# Patient Record
Sex: Male | Born: 1940 | Race: Black or African American | Marital: Married | Smoking: Former smoker
Health system: Southern US, Community
[De-identification: ages and names within clinical notes are randomized; demographics above are authoritative.]

## PROBLEM LIST (undated history)

## (undated) DIAGNOSIS — Z87438 Personal history of other diseases of male genital organs: Secondary | ICD-10-CM

## (undated) DIAGNOSIS — B559 Leishmaniasis, unspecified: Secondary | ICD-10-CM

## (undated) DIAGNOSIS — H269 Unspecified cataract: Secondary | ICD-10-CM

## (undated) DIAGNOSIS — M7500 Adhesive capsulitis of unspecified shoulder: Secondary | ICD-10-CM

## (undated) DIAGNOSIS — B370 Candidal stomatitis: Secondary | ICD-10-CM

## (undated) DIAGNOSIS — N400 Enlarged prostate without lower urinary tract symptoms: Secondary | ICD-10-CM

## (undated) DIAGNOSIS — R0602 Shortness of breath: Secondary | ICD-10-CM

## (undated) DIAGNOSIS — B54 Unspecified malaria: Secondary | ICD-10-CM

## (undated) DIAGNOSIS — I447 Left bundle-branch block, unspecified: Secondary | ICD-10-CM

## (undated) DIAGNOSIS — I509 Heart failure, unspecified: Secondary | ICD-10-CM

## (undated) DIAGNOSIS — M7711 Lateral epicondylitis, right elbow: Secondary | ICD-10-CM

## (undated) DIAGNOSIS — I2699 Other pulmonary embolism without acute cor pulmonale: Secondary | ICD-10-CM

## (undated) DIAGNOSIS — I10 Essential (primary) hypertension: Secondary | ICD-10-CM

## (undated) HISTORY — DX: Unspecified cataract: H26.9

## (undated) HISTORY — DX: Adhesive capsulitis of unspecified shoulder: M75.00

## (undated) HISTORY — DX: Personal history of other diseases of male genital organs: Z87.438

## (undated) HISTORY — DX: Left bundle-branch block, unspecified: I44.7

## (undated) HISTORY — DX: Shortness of breath: R06.02

## (undated) HISTORY — DX: Unspecified malaria: B54

## (undated) HISTORY — DX: Lateral epicondylitis, right elbow: M77.11

## (undated) HISTORY — DX: Candidal stomatitis: B37.0

## (undated) HISTORY — DX: Other pulmonary embolism without acute cor pulmonale: I26.99

## (undated) HISTORY — DX: Benign prostatic hyperplasia without lower urinary tract symptoms: N40.0

## (undated) HISTORY — DX: Essential (primary) hypertension: I10

## (undated) HISTORY — DX: Leishmaniasis, unspecified: B55.9

---

## 1978-01-03 HISTORY — PX: TREATMENT FISTULA ANAL: SUR1390

## 2012-06-04 HISTORY — PX: TOTAL KNEE ARTHROPLASTY: SHX125

## 2014-03-23 DIAGNOSIS — Z87438 Personal history of other diseases of male genital organs: Secondary | ICD-10-CM

## 2014-03-23 HISTORY — DX: Personal history of other diseases of male genital organs: Z87.438

## 2014-09-11 ENCOUNTER — Ambulatory Visit (HOSPITAL_COMMUNITY)
Admission: RE | Admit: 2014-09-11 | Discharge: 2014-09-11 | Disposition: A | Discharge status: Home / Self Care | Payer: Medicaid Other | Source: Ambulatory Visit | Attending: Student | Admitting: Student

## 2014-09-11 ENCOUNTER — Encounter: Payer: Self-pay | Admitting: Internal Medicine

## 2014-09-11 ENCOUNTER — Ambulatory Visit (INDEPENDENT_AMBULATORY_CARE_PROVIDER_SITE_OTHER): Payer: Medicaid Other | Admitting: Internal Medicine

## 2014-09-11 ENCOUNTER — Ambulatory Visit (HOSPITAL_COMMUNITY): Payer: Medicaid Other

## 2014-09-11 VITALS — BP 154/71 | HR 75 | Temp 98.1°F | Ht 71.0 in | Wt 224.5 lb

## 2014-09-11 DIAGNOSIS — N400 Enlarged prostate without lower urinary tract symptoms: Secondary | ICD-10-CM

## 2014-09-11 DIAGNOSIS — I10 Essential (primary) hypertension: Secondary | ICD-10-CM | POA: Diagnosis not present

## 2014-09-11 DIAGNOSIS — Z8679 Personal history of other diseases of the circulatory system: Secondary | ICD-10-CM

## 2014-09-11 DIAGNOSIS — Z79899 Other long term (current) drug therapy: Secondary | ICD-10-CM | POA: Diagnosis not present

## 2014-09-11 DIAGNOSIS — Z8619 Personal history of other infectious and parasitic diseases: Secondary | ICD-10-CM | POA: Diagnosis not present

## 2014-09-11 DIAGNOSIS — I447 Left bundle-branch block, unspecified: Secondary | ICD-10-CM | POA: Diagnosis not present

## 2014-09-11 DIAGNOSIS — Z87438 Personal history of other diseases of male genital organs: Secondary | ICD-10-CM | POA: Diagnosis not present

## 2014-09-11 DIAGNOSIS — Z0000 Encounter for general adult medical examination without abnormal findings: Secondary | ICD-10-CM

## 2014-09-11 LAB — GLUCOSE, CAPILLARY: GLUCOSE-CAPILLARY: 91 mg/dL (ref 65–99)

## 2014-09-11 LAB — POCT GLYCOSYLATED HEMOGLOBIN (HGB A1C): Hemoglobin A1C: 5.1

## 2014-09-11 MED ORDER — TAMSULOSIN HCL 0.4 MG PO CAPS: 0.8000 mg | ORAL_CAPSULE | Freq: Every day | ORAL | Status: DC

## 2014-09-11 NOTE — Progress Notes (Signed)
74 year old man from [place] brought in by wife and daughter, who has not sought care for a very long time. 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 He has family history of diabetes among other issues and would like to get screened. He denies shortness of breath, orthopnea, or leg swelling.   Refill his current medications for BP - amlodipine 5.   Increase tamsulosin to 0.8 mg daily. Continue Finasteride. Call the patient after 2 weeks for a follow up.  Get a baseline EKG today.   Rest per resident note. Case seen and examined with Dr Allena Katz. Plan discussed with Dr Allena Katz.

## 2014-09-11 NOTE — Assessment & Plan Note (Signed)
Reported history of LBBB per EKG when in [place]. Patient endorses some SOB when climbing a flight of stairs or walking on elevated ground. He does admit that he tries not to walk much as he is trying to save his right knee from overuse as he does not want another knee replacement as he already had for the left knee. Patient denies any chest pain, palpitations, orthopnea, PND, lower extremity edema.  -EKG today shows LBBB, Left axis deviation, sinus rhythm in bigeminy pattern. -Consider Echo in future -Will need to consider lipid panel on follow up and calculate ASCVD risk, patient will likely need to be on aspirin -Follow up in 2 weeks

## 2014-09-11 NOTE — Assessment & Plan Note (Signed)
BP Readings from Last 3 Encounters:  09/11/14 154/71   Reported history of HTN, currently on Amlodipine 5 mg and Candesartan 8 mg. -Continue current medications and follow blood pressures

## 2014-09-11 NOTE — Patient Instructions (Addendum)
It was a pleasure to meet you [name].  I will increase your Tamsulosin from 0.4 mg a day to 0.8 mg a day. You can take two 0.4 capsules with the same meal each day. I have sent a prescription to the [facility] pharmacy on [address].  We will check an EKG today as well as some blood tests called a CBC, CMP, and Hgb A1C.  Please follow up with [place] in 2 weeks.

## 2014-09-11 NOTE — Progress Notes (Signed)
Patient ID: Stephen Hendricks, male   DOB: 03-27-1940, 74 y.o.   MRN: [id number]   Subjective:   Patient ID: Stephen Hendricks male   DOB: February 22, 1940 74 y.o.   MRN: [id number]  HPI: [name] is a 74 y.o. 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 He presents to [place] as a new patient without any records on file for establishment. He is accompanied by wife and daughter who provide additional history. He states that he moved here from [place] about 5 years ago and the only hospital/clinic visit was for a left knee replacement completed at [facility].  Patient reports episode of prostatitis in March 2016 treated with antibiotics. He was started on tamsulosin and finasteride which has improved his symptoms of urgency and dribbling, but continues with frequency and pain initiating. History of prostate disease in father.  During that time of prostatitis he had fever, but noticed recurring fever one month later and again just a few days ago with temp of 100.1. Fever associated with chills that last only 1 day at most, alleviated with tylenol use. 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 Patient does not believe his fevers are malaria as they do not feel close to his previous symptoms. Patient has right eye cataracts which was surgically corrected and left arm mobile nodule he thought was related to onchocerciasis, but workup was negative. Workup for oral candida was also negative, including HIV. He does not have concern about his fever as they are un-frequent, short-living, and easily relieved with tylenol.  Patient reports recent death of his daughter to breast cancer. He was in [place] for funeral ceremonies and had an EKG done at the time which showed LBBB.  Patient has  history of smoking 2 PPD from 1958-1992 (68 pack years). Admits to alcohol use of 3-4 glasses of whiskey a day. Wife also reports beer. He denies illicit drug use.  Family history of diabetes in brother, enlarged heart in mother, other as above.  No past medical history on file. Current Outpatient Prescriptions  Medication Sig Dispense Refill  . amLODipine (NORVASC) 5 MG tablet Take 5 mg by mouth daily.    . candesartan (ATACAND) 8 MG tablet Take 8 mg by mouth daily.    . finasteride (PROSCAR) 5 MG tablet Take 5 mg by mouth daily.    . tamsulosin (FLOMAX) 0.4 MG CAPS capsule Take 2 capsules (0.8 mg total) by mouth daily. 60 capsule 2   No current facility-administered medications for this visit.   No family history on file. Social History   Social History  . Marital Status: Married    Spouse Name: N/A  . Number of Children: N/A  . Years of Education: N/A   Social History Main Topics  . Smoking status: Former Smoker    Start date: 01/04/1956    Quit date: 01/03/1990  . Smokeless tobacco: None  . Alcohol Use: None  . Drug Use: None  . Sexual Activity: Not Asked   Other Topics Concern  . None   Social History Narrative  . None   Review of Systems: Review of Systems  Constitutional: Negative for fever and chills.  HENT: Negative for ear pain and nosebleeds.        Dry nose and mouth.  Eyes:  Negative for pain.  Respiratory: Positive for cough and shortness of breath. Negative for hemoptysis, sputum production and wheezing.   Cardiovascular: Negative for chest pain, palpitations, orthopnea, claudication, leg swelling and PND.  Gastrointestinal: Negative for heartburn, nausea, vomiting, abdominal pain, diarrhea, constipation, blood in stool and melena.  Genitourinary: Positive for frequency. Negative for urgency, hematuria and flank pain.       Tinge of pain when initiating urination.  Musculoskeletal: Positive for joint pain. Negative for myalgias, back pain and falls.    Skin: Negative for rash.  Neurological: Positive for tingling. Negative for dizziness and headaches.       Numbness and tingling in left foot. Decreased sensation in heels of feet.    Objective:  Physical Exam: Filed Vitals:   09/11/14 1339  BP: 154/71  Pulse: 75  Temp: 98.1 F (36.7 C)  TempSrc: Oral  Height: 5\' 11"  (1.803 m)  Weight: 224 lb 8 oz (101.833 kg)  SpO2: 99%   Physical Exam  Constitutional: He is oriented to person, place, and time. He appears well-developed and well-nourished.  HENT:  Head: Normocephalic and atraumatic.  Right Ear: Tympanic membrane, external ear and ear canal normal.  Left Ear: Tympanic membrane, external ear and ear canal normal.  Mouth/Throat: Uvula is midline, oropharynx is clear and moist and mucous membranes are normal. No oral lesions.  Metal crowns on several teeth.  Eyes: EOM are normal.  Neck: Normal range of motion. Neck supple. No tracheal deviation present. No thyromegaly present.  Cardiovascular: Normal rate, regular rhythm and intact distal pulses.  Exam reveals no gallop and no friction rub.   No murmur heard. Pulmonary/Chest: Effort normal and breath sounds normal. No respiratory distress. He has no wheezes. He has no rales. He exhibits no tenderness.  Abdominal: Soft. Bowel sounds are normal. He exhibits no distension. There is no tenderness. There is no guarding.  Musculoskeletal: He exhibits no edema or tenderness.       Right shoulder: He exhibits decreased range of motion.       Left shoulder: He exhibits normal range of motion.  Freely mobile non-tender nodule about 1.5 cm diameter on left forearm.  Lymphadenopathy:    He has no cervical adenopathy.  Neurological: He is alert and oriented to person, place, and time.  Skin: Skin is warm. No rash noted.  Psychiatric: He has a normal mood and affect.    Assessment & Plan:  Please see problem-based charting for A/P.

## 2014-09-11 NOTE — Assessment & Plan Note (Addendum)
Patient with hx prostatitis that grew E. Coli in March 2016 treated w/ antibiotics. Has complaints of "tinge of pain" on initiation of urine that is not present after flow starts. Also endorses frequency, but no urgency, dribbling, incontinence, or hematuria. He is currently on Tamsulosin 0.4 mg daily and Finasteride 5 mg daily. Patient concerned due to hx of prostate disease in father and previous episode of prostatitis.  -Increase Tamsulosin to 0.8 mg daily -Continue Finasteride 5 mg daily -CBC/CMP -Follow up in 2 weeks for symptoms/change

## 2014-09-11 NOTE — Assessment & Plan Note (Addendum)
Patient refused flu shot today. -Hgb A1C today

## 2014-09-12 LAB — CBC
HEMATOCRIT: 45 % (ref 37.5–51.0)
Hemoglobin: 15.1 g/dL (ref 12.6–17.7)
MCH: 30.4 pg (ref 26.6–33.0)
MCHC: 33.6 g/dL (ref 31.5–35.7)
MCV: 91 fL (ref 79–97)
PLATELETS: 140 10*3/uL — AB (ref 150–379)
RBC: 4.97 x10E6/uL (ref 4.14–5.80)
RDW: 14 % (ref 12.3–15.4)
WBC: 3.9 10*3/uL (ref 3.4–10.8)

## 2014-09-12 LAB — CMP14 + ANION GAP
ALBUMIN: 3.9 g/dL (ref 3.5–4.8)
ALK PHOS: 61 IU/L (ref 39–117)
ALT: 27 IU/L (ref 0–44)
AST: 23 IU/L (ref 0–40)
Albumin/Globulin Ratio: 1.3 (ref 1.1–2.5)
Anion Gap: 17 mmol/L (ref 10.0–18.0)
BILIRUBIN TOTAL: 0.5 mg/dL (ref 0.0–1.2)
BUN / CREAT RATIO: 16 (ref 10–22)
BUN: 16 mg/dL (ref 8–27)
CHLORIDE: 98 mmol/L (ref 97–108)
CO2: 25 mmol/L (ref 18–29)
Calcium: 9.4 mg/dL (ref 8.6–10.2)
Creatinine, Ser: 0.97 mg/dL (ref 0.76–1.27)
GFR calc non Af Amer: 78 mL/min/{1.73_m2} (ref >59)
GFR, EST AFRICAN AMERICAN: 90 mL/min/{1.73_m2} (ref >59)
GLOBULIN, TOTAL: 3.1 g/dL (ref 1.5–4.5)
Glucose: 96 mg/dL (ref 65–99)
Potassium: 4.1 mmol/L (ref 3.5–5.2)
SODIUM: 140 mmol/L (ref 134–144)
TOTAL PROTEIN: 7 g/dL (ref 6.0–8.5)

## 2014-09-29 ENCOUNTER — Ambulatory Visit (INDEPENDENT_AMBULATORY_CARE_PROVIDER_SITE_OTHER): Payer: Medicaid Other | Admitting: Internal Medicine

## 2014-09-29 ENCOUNTER — Encounter: Payer: Self-pay | Admitting: Internal Medicine

## 2014-09-29 VITALS — BP 135/67 | HR 86 | Temp 97.0°F | Wt 228.5 lb

## 2014-09-29 DIAGNOSIS — I10 Essential (primary) hypertension: Secondary | ICD-10-CM

## 2014-09-29 DIAGNOSIS — M546 Pain in thoracic spine: Secondary | ICD-10-CM

## 2014-09-29 DIAGNOSIS — G629 Polyneuropathy, unspecified: Secondary | ICD-10-CM | POA: Diagnosis not present

## 2014-09-29 DIAGNOSIS — I447 Left bundle-branch block, unspecified: Secondary | ICD-10-CM | POA: Diagnosis not present

## 2014-09-29 DIAGNOSIS — J3489 Other specified disorders of nose and nasal sinuses: Secondary | ICD-10-CM | POA: Diagnosis not present

## 2014-09-29 DIAGNOSIS — N401 Enlarged prostate with lower urinary tract symptoms: Secondary | ICD-10-CM

## 2014-09-29 DIAGNOSIS — R350 Frequency of micturition: Secondary | ICD-10-CM

## 2014-09-29 DIAGNOSIS — R200 Anesthesia of skin: Secondary | ICD-10-CM | POA: Insufficient documentation

## 2014-09-29 DIAGNOSIS — R208 Other disturbances of skin sensation: Secondary | ICD-10-CM | POA: Insufficient documentation

## 2014-09-29 DIAGNOSIS — N400 Enlarged prostate without lower urinary tract symptoms: Secondary | ICD-10-CM

## 2014-09-29 DIAGNOSIS — Z8679 Personal history of other diseases of the circulatory system: Secondary | ICD-10-CM

## 2014-09-29 MED ORDER — SALINE SPRAY 0.65 % NA SOLN: 1.0000 | NASAL | Status: DC | PRN

## 2014-09-29 NOTE — Assessment & Plan Note (Signed)
Patient reports midline thoracic back pain that is chronic, first noticed after lifting heavy objects quite some time ago. He states that the pain occurs in the morning when he wakes, is about 7/10 in intensity, lasts for 30 minutes, and finds relief with movement and tylenol. He denies any radiation of pain or limiting of his daily activities. There is no tenderness of the spinous processes or paraspinal muscles on exam or decreased ROM of back. His symptoms are likely due to osteoarthritis, controlled well with tylenol. -Will continue with tylenol prn -Do not think imaging is warranted at this time, can consider if symptoms worsen or limit daily activities

## 2014-09-29 NOTE — Assessment & Plan Note (Signed)
BP Readings from Last 3 Encounters:  09/29/14 135/67  09/11/14 154/71   Blood pressure improved today at 135/67. -Continue Amlodipine 5 mg and Candesartan 8 mg daily

## 2014-09-29 NOTE — Assessment & Plan Note (Signed)
Patient reports chronic numbness of left plantar surface that is constant, without pain or tingling, but bothersome. He reports it being worse when wearing shoes compared to feeling better when wearing sandals. He denies any limit in ambulation due to his symptoms. On exam, sensation is intact in bilateral feet, pedal pulses intact, and ROM intact. He is able to bear weight on foot without issue. At this point, I am not sure what the reason for this neuropathy is, but doubt it is due to diabetic neuropathy as his A1C is 5.1, plantar fascitis as I would expect more significant pain, mononeuritis as there is no foot drop, or B12 deficiency with MCV of 91. Will consider other causes for neuropathy such as HIV, syphilis, or inflammatory disorders. -Check ESR today, patient declined HIV and RPR -Referral for nerve conduction study

## 2014-09-29 NOTE — Assessment & Plan Note (Signed)
Patient states his symptoms of urinary frequency is much improved with the increase in Tamsulosin to 0.8 mg daily on last visit. He and his family have continued concerns about prostate disease due to history in father. Discussed with patient and family that medical management for his symptoms are likely to be long-term and that it is encouraging that he is responding to the increased Tamsulosin dose. Also discussed the option for PSA testing and possible need for Urology referral/invasive procedures should result be significantly high versus continuing medical management. They understand and prefer PSA testing. -Check PSA today -Continue Tamsulosin 0.8 mg  -Continue Finasteride 5 mg daily

## 2014-09-29 NOTE — Patient Instructions (Signed)
Please continue your medications as prescribed.  I will send a referral to cardiology for your left bundle branch block and check a lipid profile today.  We will check a PSA today for your prostate.  We will check a blood test called ESR today for your foot numbness and refer you for nerve conduction study.  I have sent a prescription for a nasal spray to your pharmacy for your nasal dryness and bleeding.  Please continue to manage your back pain with tylenol.

## 2014-09-29 NOTE — Progress Notes (Signed)
Patient ID: Stephen Hendricks, male   DOB: [birth year], 74 y.o.   MRN: [id number]   Subjective:   Patient ID: Stephen Hendricks male   DOB: 1940/03/02 74 y.o.   MRN: [id number]  HPI: [name] is a 74 y.o. male with PMH of HTN, BPH, and LBBB who presents for follow up for HTN and BPH management with complaints of back pain, nasal dryness/irritation, and left foot numbness. He is accompanied by his wife and daughter.  Please see problem list for status of chronic medical conditions.   No past medical history on file. Current Outpatient Prescriptions  Medication Sig Dispense Refill  . amLODipine (NORVASC) 5 MG tablet Take 5 mg by mouth daily.    Marland Kitchen aspirin 81 MG tablet Take 81 mg by mouth daily.    . candesartan (ATACAND) 8 MG tablet Take 8 mg by mouth daily.    . finasteride (PROSCAR) 5 MG tablet Take 5 mg by mouth daily.    . tamsulosin (FLOMAX) 0.4 MG CAPS capsule Take 2 capsules (0.8 mg total) by mouth daily. 60 capsule 2  . sodium chloride (OCEAN) 0.65 % SOLN nasal spray Place 1 spray into both nostrils as needed for congestion. 50 mL 0   No current facility-administered medications for this visit.   No family history on file. Social History   Social History  . Marital Status: Married    Spouse Name: N/A  . Number of Children: N/A  . Years of Education: N/A   Social History Main Topics  . Smoking status: Former Smoker    Start date: 01/04/1956    Quit date: 01/03/1990  . Smokeless tobacco: None  . Alcohol Use: None  . Drug Use: None  . Sexual Activity: Not Asked   Other Topics Concern  . None   Social History Narrative   Review of Systems: Review of Systems  Constitutional: Negative for fever and chills.  HENT: Negative for nosebleeds.   Respiratory: Negative for cough, sputum production and wheezing.        SOB when walking up stairs and on inclines.  Cardiovascular: Negative for chest pain, palpitations, orthopnea, claudication, leg swelling and PND.    Gastrointestinal: Negative for heartburn, nausea, vomiting, abdominal pain, diarrhea and constipation.  Genitourinary: Negative for dysuria, urgency and frequency.  Musculoskeletal: Positive for back pain and joint pain. Negative for myalgias, falls and neck pain.  Skin: Negative for rash.  Neurological: Negative for dizziness, tingling, weakness and headaches.       Left foot numbness on plantar surface.    Objective:  Physical Exam: Filed Vitals:   09/29/14 1337  BP: 135/67  Pulse: 86  Temp: 97 F (36.1 C)  TempSrc: Oral  Weight: 228 lb 8 oz (103.647 kg)  SpO2: 97%   Physical Exam  Constitutional: He is oriented to person, place, and time. He appears well-developed and well-nourished. No distress.  HENT:  Head: Normocephalic and atraumatic.  Nose: No mucosal edema. No epistaxis.  Mouth/Throat: Oropharynx is clear and moist.  Dry, slightly erythematous sore seen on left nasal septum.  Cardiovascular: Normal rate, regular rhythm and intact distal pulses.  Exam reveals no friction rub.   No murmur heard. Pulmonary/Chest: Effort normal and breath sounds normal. No respiratory distress. He has no wheezes.  Abdominal: Soft. Bowel sounds are normal. There is no tenderness.  Musculoskeletal:       Cervical back: He exhibits no tenderness and no bony tenderness.       Thoracic back: He exhibits normal  range of motion and no tenderness.       Lumbar back: He exhibits normal range of motion and no tenderness.       Right foot: There is normal range of motion and no tenderness.       Left foot: There is normal range of motion and no tenderness.  Neurological: He is alert and oriented to person, place, and time.  Sensation intact of bilateral feet.  Skin: Skin is warm.  Psychiatric: He has a normal mood and affect.    Assessment & Plan:  Please see problem based charting for assessment and plan.

## 2014-09-29 NOTE — Assessment & Plan Note (Signed)
EKG on last clinic visit shows LBBB, left axis deviation, and sinus rhythm in bigeminy pattern consistent with patient's self-reported history of LBBB. He reports some SOB when walking on elevated ground or up a flight of stairs. He denies any CP, palpitations, orthopnea, PND, cough, or lower extremity edema. His BP is currently controlled on Amlodipine 5 mg and Candesartan 8 mg daily. He reports taking Aspirin 81 mg daily. He and his family express concerns about further evaluation and/or intervention that should be pursued. At this time, I feel that medical management with blood pressure control and aspirin is sufficient so long as his symptoms are stable/do not worsen, but will request cardiology referral for additional rec's.  -Cardiology referral -Continue BP control with Amlodipine 5 mg and Candesartan 8 mg -Continue Aspirin 81 mg -Check Lipid profile today

## 2014-09-29 NOTE — Assessment & Plan Note (Signed)
Patient reports left sided nasal dryness with spotting of blood seen on tissue after blowing nose. He states that this has been going on for many months now and does admit that he sometimes picks at the irritated area. There is a small dry, healing area seen on left side of nasal septum during physical exam. Patient reports trying a topical cream in the past without relief. -Will try nasal spray (OCEAN) for dryness -Patient advised to avoid picking at area

## 2014-09-30 LAB — LIPID PANEL
CHOL/HDL RATIO: 3.3 ratio (ref 0.0–5.0)
Cholesterol, Total: 221 mg/dL — ABNORMAL HIGH (ref 100–199)
HDL: 67 mg/dL (ref >39)
LDL CALC: 124 mg/dL — AB (ref 0–99)
TRIGLYCERIDES: 151 mg/dL — AB (ref 0–149)
VLDL Cholesterol Cal: 30 mg/dL (ref 5–40)

## 2014-09-30 LAB — PSA: Prostate Specific Ag, Serum: 1.5 ng/mL (ref 0.0–4.0)

## 2014-09-30 LAB — SEDIMENTATION RATE: Sed Rate: 3 mm/hr (ref 0–30)

## 2014-09-30 NOTE — Progress Notes (Signed)
Internal Medicine Clinic Attending  I saw and evaluated the patient.  I personally confirmed the key portions of the history and exam documented by Dr. Darreld Mclean and I reviewed pertinent patient test results.  The assessment, diagnosis, and plan were formulated together and I agree with the documentation in the resident's note.  Patient and family are very interested in referrals and advanced imaging.  Have reviewed Dr. Eliane Decree note.  They may benefit from frequent visits.

## 2014-10-02 DIAGNOSIS — M7711 Lateral epicondylitis, right elbow: Secondary | ICD-10-CM | POA: Insufficient documentation

## 2014-10-02 DIAGNOSIS — I447 Left bundle-branch block, unspecified: Secondary | ICD-10-CM | POA: Insufficient documentation

## 2014-10-02 DIAGNOSIS — I10 Essential (primary) hypertension: Secondary | ICD-10-CM | POA: Insufficient documentation

## 2014-10-02 DIAGNOSIS — M7500 Adhesive capsulitis of unspecified shoulder: Secondary | ICD-10-CM | POA: Insufficient documentation

## 2014-10-03 ENCOUNTER — Encounter: Payer: Self-pay | Admitting: Physician Assistant

## 2014-10-03 ENCOUNTER — Ambulatory Visit (INDEPENDENT_AMBULATORY_CARE_PROVIDER_SITE_OTHER): Payer: Medicaid Other | Admitting: Physician Assistant

## 2014-10-03 VITALS — BP 146/78 | HR 87 | Ht 71.0 in | Wt 225.0 lb

## 2014-10-03 DIAGNOSIS — R0600 Dyspnea, unspecified: Secondary | ICD-10-CM

## 2014-10-03 DIAGNOSIS — I10 Essential (primary) hypertension: Secondary | ICD-10-CM

## 2014-10-03 DIAGNOSIS — E785 Hyperlipidemia, unspecified: Secondary | ICD-10-CM

## 2014-10-03 DIAGNOSIS — I447 Left bundle-branch block, unspecified: Secondary | ICD-10-CM

## 2014-10-03 MED ORDER — ATORVASTATIN CALCIUM 40 MG PO TABS: 40.0000 mg | ORAL_TABLET | Freq: Every day | ORAL | Status: DC

## 2014-10-03 NOTE — Progress Notes (Signed)
Patient ID: Stephen Hendricks, male   DOB: 1940/06/19, 74 y.o.   MRN: [id number]    Date:  10/03/2014   ID:  Stephen Hendricks, DOB 24-May-1940, MRN [id number]  PCP:  Darreld Mclean, MD  Primary Cardiologist:  New   Complaint: Left bundle branch block   History of Present Illness: Stephen Hendricks is a 74 y.o. male with a history of essential hypertension, BPH, prostatitis left bundle branch block, malaria, leishmaniasis, frozen shoulder.  The patient is here for evaluation of his left bundle branch block.  Until approximately 2 years ago the patient's wife usually had a hard time keeping up with her husband despite his limp because of his left knee arthritis however, since that time he has been having a hard time keeping up with her because of shortness of breath. This probably gotten worse in the last year. He denies any chest pain or pressure as well as nausea, vomiting, orthopnea, dizziness, PND, cough, congestion, abdominal pain, hematochezia, melena, lower extremity edema, claudication.  His had some periodic fevers or chills related to malaria.  He quit smoking in 1993. He drinks 3-4 drinks per day.   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 When he started having problems with his left knee and stopped all activities for the most part  Wt Readings from Last 3 Encounters:  10/03/14 225 lb (102.059 kg)  09/29/14 228 lb 8 oz (103.647 kg)  09/11/14 224 lb 8 oz (101.833 kg)     Past Medical History  Diagnosis Date  . HTN (hypertension)   . BPH (benign prostatic hyperplasia)   . LBBB (left bundle branch block)   . Frozen shoulder   . Right tennis elbow   . Hx of prostatitis 3.20.16    WITH E.COLI  . Cataract of right eye   . Malaria     WENT TO [place]  . Leishmaniasis     WENT TO [place]  . Oral candidiasis     WENT TO [place]  . SOB (shortness of breath)    Family History  Problem Relation Age of Onset  . Other Father     PROSTATE DISEASE  .  Cancer Daughter     BREAST  . Heart attack Neg Hx   . Hypertension Neg Hx   . Stroke Neg Hx   . Cardiomyopathy Mother      Current Outpatient Prescriptions  Medication Sig Dispense Refill  . amLODipine (NORVASC) 5 MG tablet Take 5 mg by mouth daily.    Marland Kitchen aspirin 81 MG tablet Take 81 mg by mouth daily.    . candesartan (ATACAND) 8 MG tablet Take 8 mg by mouth daily.    . finasteride (PROSCAR) 5 MG tablet Take 5 mg by mouth daily.    . sodium chloride (OCEAN) 0.65 % SOLN nasal spray Place 1 spray into both nostrils as needed for congestion. 50 mL 0  . tamsulosin (FLOMAX) 0.4 MG CAPS capsule Take 2 capsules (0.8 mg total) by mouth daily. 60 capsule 2  . atorvastatin (LIPITOR) 40 MG tablet Take 1 tablet (40 mg total) by mouth daily. 90 tablet 3   No current facility-administered medications for this visit.    Allergies:   No Known Allergies  Social History:  The patient  reports that he quit smoking about 23 years ago. He started smoking about 58 years ago. He does not have any smokeless tobacco history on file. He reports that he drinks about 1.8 -  2.4 oz of alcohol per week.   Family history:   Family History  Problem Relation Age of Onset  . Other Father     PROSTATE DISEASE  . Cancer Daughter     BREAST  . Heart attack Neg Hx   . Hypertension Neg Hx   . Stroke Neg Hx   . Cardiomyopathy Mother     ROS:  Please see the history of present illness.  All other systems reviewed and negative.   PHYSICAL EXAM: VS:  BP 146/78 mmHg  Pulse 87  Ht  (1.803 m)  Wt 225 lb (102.059 kg)  BMI 31.39 kg/m2 Well nourished, well developed, in no acute distress HEENT: Pupils are equal round react to light accommodation extraocular movements are intact. Bilateral corneal arcus.  No carotid bruit Neck: no JVDNo cervical lymphadenopathy. Cardiac: Regular rate and rhythm without murmurs rubs or gallops. Lungs:  clear to auscultation bilaterally, no wheezing, rhonchi or rales Abd: soft,  nontender, positive bowel sounds all quadrants, no hepatosplenomegaly Ext: no lower extremity edema.  2+ radial and dorsalis pedis pulses. Skin: warm and dry Neuro:  Grossly normal  Lipid Panel     Component Value Date/Time   CHOL 221* 09/29/2014 1511   TRIG 151* 09/29/2014 1511   HDL 67 09/29/2014 1511   CHOLHDL 3.3 09/29/2014 1511   LDLCALC 124* 09/29/2014 1511     EKG:   Normal sinus rhythm rate 87 bpm. Mildly prolonged QTC. Possible atrial enlargement.  ASSESSMENT AND PLAN:  Problem List Items Addressed This Visit    LBBB (left bundle branch block)   Relevant Medications   atorvastatin (LIPITOR) 40 MG tablet   Other Relevant Orders   EKG 12-Lead   Myocardial Perfusion Imaging   Echocardiogram   Essential hypertension - Primary   Relevant Medications   atorvastatin (LIPITOR) 40 MG tablet   Other Relevant Orders   EKG 12-Lead   Hepatic function panel   Lipid Profile    Other Visit Diagnoses    Dyspnea        Relevant Orders    Myocardial Perfusion Imaging    Echocardiogram       Left bundle branch block, paroxysmal: Left bundle branch block has resolved on today's EKG.   We'll conduct [employer].  Dyspnea Check 2-D echocardiogram as well as Lexiscan. It's been the last 1-2 years since his dyspnea and exercise tolerance is decreased considerably.  Hyperlipidemia Start Lipitor 40 mg daily. Check LFTs and lipids in 6 weeks. Baseline LFTs normal 09/11/2014  Essential Hypertension No changes to current medication. May need titration of amlodipine or candesartan in the future  Discussed with Dr. Delton See

## 2014-10-03 NOTE — Patient Instructions (Addendum)
Medication Instructions:  Your physician has recommended you make the following change in your medication:  1. START Lipitor 40 mg taking 1 tablet daily    Labwork: 11-14-14: LFT                  LIPID  Testing/Procedures: Your physician has requested that you have a lexiscan myoview. For further information please visit [email or web address] Please follow instruction sheet, as given.  Your physician has requested that you have an echocardiogram. Echocardiography is a painless test that uses sound waves to create images of your heart. It provides your doctor with information about the size and shape of your heart and how well your heart's chambers and valves are working. This procedure takes approximately one hour. There are no restrictions for this procedure.   Follow-Up: Your physician recommends that you schedule a follow-up appointment in: 3 WEEKS WITH BRYAN HAGER, PA-C(FLEX IS FINE PER BRYAN)   Any Other Special Instructions Will Be Listed Below (If Applicable).  Pharmacologic Stress Electrocardiogram A pharmacologic stress electrocardiogram is a heart (cardiac) test that uses nuclear imaging to evaluate the blood supply to your heart. This test may also be called a pharmacologic stress electrocardiography. Pharmacologic means that a medicine is used to increase your heart rate and blood pressure.  This stress test is done to find areas of poor blood flow to the heart by determining the extent of coronary artery disease (CAD). Some people exercise on a treadmill, which naturally increases the blood flow to the heart. For those people unable to exercise on a treadmill, a medicine is used. This medicine stimulates your heart and will cause your heart to beat harder and more quickly, as if you were exercising.  Pharmacologic stress tests can help determine:  The adequacy of blood flow to your heart during increased levels of activity in order to clear you for discharge home.  The extent  of coronary artery blockage caused by CAD.  Your prognosis if you have suffered a heart attack.  The effectiveness of cardiac procedures done, such as an angioplasty, which can increase the circulation in your coronary arteries.  Causes of chest pain or pressure. LET [facility] CARE PROVIDER KNOW ABOUT:  Any allergies you have.  All medicines you are taking, including vitamins, herbs, eye drops, creams, and over-the-counter medicines.  Previous problems you or members of your family have had with the use of anesthetics.  Any blood disorders you have.  Previous surgeries you have had.  Medical conditions you have.  Possibility of pregnancy, if this applies.  If you are currently breastfeeding. RISKS AND COMPLICATIONS Generally, this is a safe procedure. However, as with any procedure, complications can occur. Possible complications include:  You develop pain or pressure in the following areas:  Chest.  Jaw or neck.  Between your shoulder blades.  Radiating down your left arm.  Headache.  Dizziness or light-headedness.  Shortness of breath.  Increased or irregular heartbeat.  Low blood pressure.  Nausea or vomiting.  Flushing.  Redness going up the arm and slight pain during injection of medicine.  Heart attack (rare). BEFORE THE PROCEDURE   Avoid all forms of caffeine for 24 hours before your test or as directed by your health care provider. This includes coffee, tea (even decaffeinated tea), caffeinated sodas, chocolate, cocoa, and certain pain medicines.  Follow your health care provider's instructions regarding eating and drinking before the test.  Take your medicines as directed at regular times with water unless instructed otherwise.  Exceptions may include:  If you have diabetes, ask how you are to take your insulin or pills. It is common to adjust insulin dosing the morning of the test.  If you are taking beta-blocker medicines, it is important to  talk to your health care provider about these medicines well before the date of your test. Taking beta-blocker medicines may interfere with the test. In some cases, these medicines need to be changed or stopped 24 hours or more before the test.  If you wear a nitroglycerin patch, it may need to be removed prior to the test. Ask your health care provider if the patch should be removed before the test.  If you use an inhaler for any breathing condition, bring it with you to the test.  If you are an outpatient, bring a snack so you can eat right after the stress phase of the test.  Do not smoke for 4 hours prior to the test or as directed by your health care provider.  Do not apply lotions, powders, creams, or oils on your chest prior to the test.  Wear comfortable shoes and clothing. Let your health care provider know if you were unable to complete or follow the preparations for your test. PROCEDURE   Multiple patches (electrodes) will be put on your chest. If needed, small areas of your chest may be shaved to get better contact with the electrodes. Once the electrodes are attached to your body, multiple wires will be attached to the electrodes, and your heart rate will be monitored.  An IV access will be started. A nuclear trace (isotope) is given. The isotope may be given intravenously, or it may be swallowed. Nuclear refers to several types of radioactive isotopes, and the nuclear isotope lights up the arteries so that the nuclear images are clear. The isotope is absorbed by your body. This results in low radiation exposure.  A resting nuclear image is taken to show how your heart functions at rest.  A medicine is given through the IV access.  A second scan is done about 1 hour after the medicine injection and determines how your heart functions under stress.  During this stress phase, you will be connected to an electrocardiogram machine. Your blood pressure and oxygen levels will be  monitored. AFTER THE PROCEDURE   Your heart rate and blood pressure will be monitored after the test.  You may return to your normal schedule, including diet,activities, and medicines, unless your health care provider tells you otherwise. Document Released: 05/08/2008 Document Revised: 12/25/2012 Document Reviewed: 08/27/2012 [facility] Patient Information 2015 [place], [place]. This information is not intended to replace advice given to you by your health care provider. Make sure you discuss any questions you have with your health care provider.   Echocardiogram An echocardiogram, or echocardiography, uses sound waves (ultrasound) to produce an image of your heart. The echocardiogram is simple, painless, obtained within a short period of time, and offers valuable information to your health care provider. The images from an echocardiogram can provide information such as:  Evidence of coronary artery disease (CAD).  Heart size.  Heart muscle function.  Heart valve function.  Aneurysm detection.  Evidence of a past heart attack.  Fluid buildup around the heart.  Heart muscle thickening.  Assess heart valve function. LET [facility] CARE PROVIDER KNOW ABOUT:  Any allergies you have.  All medicines you are taking, including vitamins, herbs, eye drops, creams, and over-the-counter medicines.  Previous problems you or members of  your family have had with the use of anesthetics.  Any blood disorders you have.  Previous surgeries you have had.  Medical conditions you have.  Possibility of pregnancy, if this applies. BEFORE THE PROCEDURE  No special preparation is needed. Eat and drink normally.  PROCEDURE   In order to produce an image of your heart, gel will be applied to your chest and a wand-like tool (transducer) will be moved over your chest. The gel will help transmit the sound waves from the transducer. The sound waves will harmlessly bounce off your heart to allow the  heart images to be captured in real-time motion. These images will then be recorded.  You may need an IV to receive a medicine that improves the quality of the pictures. AFTER THE PROCEDURE You may return to your normal schedule including diet, activities, and medicines, unless your health care provider tells you otherwise. Document Released: 12/18/1999 Document Revised: 05/06/2013 Document Reviewed: 08/27/2012 [facility] Patient Information 2015 [place], [place]. This information is not intended to replace advice given to you by your health care provider. Make sure you discuss any questions you have with your health care provider.

## 2014-10-11 ENCOUNTER — Encounter: Payer: Self-pay | Admitting: Internal Medicine

## 2014-10-14 ENCOUNTER — Other Ambulatory Visit: Payer: Self-pay | Admitting: Internal Medicine

## 2014-10-15 ENCOUNTER — Telehealth (HOSPITAL_COMMUNITY): Payer: Self-pay | Admitting: *Deleted

## 2014-10-15 NOTE — Telephone Encounter (Signed)
Patient given detailed instructions per Myocardial Perfusion Study Information Sheet for test on 10/17/14 at 1130. Patient notified to arrive 15 minutes early and that it is imperative to arrive on time for appointment to keep from having the test rescheduled.  If you need to cancel or reschedule your appointment, please call the office within 24 hours of your appointment. Failure to do so may result in a cancellation of your appointment, and a $50 no show fee. Patient verbalized understanding. Antionette CharMary J Alann Avey, RN

## 2014-10-16 ENCOUNTER — Other Ambulatory Visit: Payer: Self-pay | Admitting: *Deleted

## 2014-10-16 DIAGNOSIS — I10 Essential (primary) hypertension: Secondary | ICD-10-CM

## 2014-10-16 MED ORDER — CANDESARTAN CILEXETIL 8 MG PO TABS: 8.0000 mg | ORAL_TABLET | Freq: Every day | ORAL | Status: DC

## 2014-10-16 MED ORDER — AMLODIPINE BESYLATE 5 MG PO TABS: 5.0000 mg | ORAL_TABLET | Freq: Every day | ORAL | Status: DC

## 2014-10-17 ENCOUNTER — Ambulatory Visit (HOSPITAL_COMMUNITY): Payer: Medicaid Other | Attending: Internal Medicine

## 2014-10-17 ENCOUNTER — Ambulatory Visit (HOSPITAL_BASED_OUTPATIENT_CLINIC_OR_DEPARTMENT_OTHER): Payer: Medicaid Other

## 2014-10-17 ENCOUNTER — Other Ambulatory Visit: Payer: Self-pay

## 2014-10-17 DIAGNOSIS — I351 Nonrheumatic aortic (valve) insufficiency: Secondary | ICD-10-CM | POA: Diagnosis not present

## 2014-10-17 DIAGNOSIS — R0600 Dyspnea, unspecified: Secondary | ICD-10-CM | POA: Diagnosis present

## 2014-10-17 DIAGNOSIS — I447 Left bundle-branch block, unspecified: Secondary | ICD-10-CM | POA: Diagnosis not present

## 2014-10-17 DIAGNOSIS — R0602 Shortness of breath: Secondary | ICD-10-CM | POA: Insufficient documentation

## 2014-10-17 DIAGNOSIS — Z87891 Personal history of nicotine dependence: Secondary | ICD-10-CM | POA: Insufficient documentation

## 2014-10-17 DIAGNOSIS — E785 Hyperlipidemia, unspecified: Secondary | ICD-10-CM | POA: Insufficient documentation

## 2014-10-17 DIAGNOSIS — I10 Essential (primary) hypertension: Secondary | ICD-10-CM | POA: Insufficient documentation

## 2014-10-17 LAB — MYOCARDIAL PERFUSION IMAGING
CHL CUP NUCLEAR SDS: 4
CHL CUP NUCLEAR SRS: 1
CHL CUP NUCLEAR SSS: 5
LHR: 0.36
LVDIAVOL: 112 mL
LVSYSVOL: 54 mL
Peak HR: 90 {beats}/min
Rest HR: 74 {beats}/min
TID: 1

## 2014-10-17 MED ORDER — TECHNETIUM TC 99M SESTAMIBI GENERIC - CARDIOLITE
32.1000 | Freq: Once | INTRAVENOUS | Status: AC | PRN
  Administered 2014-10-17: 32.1 via INTRAVENOUS

## 2014-10-17 MED ORDER — REGADENOSON 0.4 MG/5ML IV SOLN
0.4000 mg | Freq: Once | INTRAVENOUS | Status: AC
  Administered 2014-10-17: 0.4 mg via INTRAVENOUS

## 2014-10-17 MED ORDER — TECHNETIUM TC 99M SESTAMIBI GENERIC - CARDIOLITE
10.2000 | Freq: Once | INTRAVENOUS | Status: AC | PRN
  Administered 2014-10-17: 10 via INTRAVENOUS

## 2014-10-23 ENCOUNTER — Encounter: Payer: Self-pay | Admitting: Physician Assistant

## 2014-10-23 ENCOUNTER — Ambulatory Visit (INDEPENDENT_AMBULATORY_CARE_PROVIDER_SITE_OTHER): Payer: Medicaid Other | Admitting: Physician Assistant

## 2014-10-23 VITALS — BP 134/88 | HR 75 | Ht 73.0 in | Wt 225.8 lb

## 2014-10-23 DIAGNOSIS — E785 Hyperlipidemia, unspecified: Secondary | ICD-10-CM | POA: Diagnosis not present

## 2014-10-23 DIAGNOSIS — I272 Other secondary pulmonary hypertension: Secondary | ICD-10-CM | POA: Diagnosis not present

## 2014-10-23 DIAGNOSIS — I10 Essential (primary) hypertension: Secondary | ICD-10-CM

## 2014-10-23 DIAGNOSIS — I447 Left bundle-branch block, unspecified: Secondary | ICD-10-CM

## 2014-10-23 NOTE — Progress Notes (Signed)
Patient ID: Stephen Hendricks, male   DOB: Dec 13, 1940, 74 y.o.   MRN: [id number]    Date:  10/23/2014   ID:  Stephen Hendricks, DOB [birth year], MRN [id number]  PCP:  Darreld Mclean, MD  Primary Cardiologist:  New([facility])  Chief complaint: Follow-up previous visit   History of Present Illness: Stephen Hendricks is a 74 y.o. male with a history of essential hypertension, BPH, prostatitis left bundle branch block, malaria, leishmaniasis, frozen shoulder. The patient is here for evaluation of his left bundle branch block. Until approximately 2 years ago the patient's wife usually had a hard time keeping up with him despite his limp because of his left knee arthritis, however, since that time he has been having a hard time keeping up with her because of shortness of breath. This probably gotten worse in the last year.  He's had some periodic fevers or chills related to malaria. He quit smoking in 1993. He drinks 3-4 drinks per day. 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 When he started having problems with his left knee and stopped all activities for the most part.  When I saw the patient last on echocardiogram and a nuclear stress test.  Both were normal with the exception of mild pulmonary hypertension with a peak PA pressure 38 mmHg.  He is here today just to follow-up studies.  His been no changes in his status since her last visit. He is due to get his cholesterol and LFTs checked on November 11.  The patient currently denies nausea, vomiting, fever, chest pain, orthopnea, dizziness, PND, cough, congestion, abdominal pain, hematochezia, melena, lower extremity edema, claudication.  Wt Readings from Last 3 Encounters:  10/23/14 225 lb 12.8 oz (102.422 kg)  10/03/14 225 lb (102.059 kg)  09/29/14 228 lb 8 oz (103.647 kg)     Past Medical History  Diagnosis Date  . HTN (hypertension)   . BPH (benign prostatic hyperplasia)   . LBBB (left bundle branch  block)   . Frozen shoulder   . Right tennis elbow   . Hx of prostatitis 3.20.16    WITH E.COLI  . Cataract of right eye   . Malaria     WENT [place]  . Leishmaniasis     [place]  . Oral candidiasis     WENT TO [place]  . SOB (shortness of breath)     Current Outpatient Prescriptions  Medication Sig Dispense Refill  . amLODipine (NORVASC) 5 MG tablet Take 1 tablet (5 mg total) by mouth daily. 30 tablet 5  . aspirin 81 MG tablet Take 81 mg by mouth daily.    Marland Kitchen atorvastatin (LIPITOR) 40 MG tablet Take 1 tablet (40 mg total) by mouth daily. 90 tablet 3  . candesartan (ATACAND) 8 MG tablet Take 1 tablet (8 mg total) by mouth daily. 30 tablet 5  . finasteride (PROSCAR) 5 MG tablet Take 5 mg by mouth daily.    . sodium chloride (OCEAN) 0.65 % nasal spray Place 1 spray into the nose daily as needed for congestion.    . tamsulosin (FLOMAX) 0.4 MG CAPS capsule Take 2 capsules (0.8 mg total) by mouth daily. 60 capsule 2   No current facility-administered medications for this visit.    Allergies:   No Known Allergies  Social History:  The patient  reports that he quit smoking about 23 years ago. He started smoking about 58 years ago. He does not have any smokeless tobacco history  on file. He reports that he drinks about 1.8 - 2.4 oz of alcohol per week.   Family history:   Family History  Problem Relation Age of Onset  . Other Father     PROSTATE DISEASE  . Cancer Daughter     BREAST  . Heart attack Neg Hx   . Hypertension Neg Hx   . Stroke Neg Hx   . Cardiomyopathy Mother     ROS:  Please see the history of present illness.  All other systems reviewed and negative.   PHYSICAL EXAM: VS:  BP 134/88 mmHg  Pulse 75  Ht 6\' 1"  (1.854 m)  Wt 225 lb 12.8 oz (102.422 kg)  BMI 29.80 kg/m2  SpO2 96% Well nourished, well developed, in no acute distress HEENT: Pupils are equal round react to light accommodation extraocular movements are intact.  Neck: no JVDNo cervical  lymphadenopathy. Cardiac: Regular rate and rhythm without murmurs rubs or gallops. Lungs:  clear to auscultation bilaterally, no wheezing, rhonchi or rales Ext: no lower extremity edema.  2+ radial and dorsalis pedis pulses. Skin: warm and dry Neuro:  Grossly normal   ASSESSMENT AND PLAN:  Problem List Items Addressed This Visit    Pulmonary hypertension (HCC)   Left bundle branch block (LBBB)   Hyperlipidemia   Essential hypertension - Primary      patient had a long history of tobacco abuse since 1958 and quit in 1993.   This could be the source of his pulmonary hypertension. He had an essentially normal echocardiogram and normal nuclear stress test.  We'll see him back on an as-needed basisI hadssigned to Dr. Duke Salviaandolph if he needs to be seen in the future. He does have a follow-up cholesterol and LFTs on November 11. Continue candesartan and amlodipine for blood pressure.

## 2014-10-23 NOTE — Patient Instructions (Signed)
Medication Instructions:  Your physician recommends that you continue on your current medications as directed. Please refer to the Current Medication list given to you today.  Lab work: NONE  Testing/Procedures: NONE  Follow-Up: Your physician recommends that you schedule a follow-up appointment as needed with Dr. Chilton Siiffany [place]. Call [phone] [place] office.

## 2014-10-29 ENCOUNTER — Emergency Department (HOSPITAL_COMMUNITY)
Admission: EM | Admit: 2014-10-29 | Discharge: 2014-10-29 | Disposition: A | Discharge status: Home / Self Care | Payer: Medicaid Other | Attending: Physician Assistant | Admitting: Physician Assistant

## 2014-10-29 ENCOUNTER — Emergency Department (HOSPITAL_COMMUNITY): Payer: Medicaid Other

## 2014-10-29 ENCOUNTER — Encounter: Payer: Self-pay | Admitting: Internal Medicine

## 2014-10-29 ENCOUNTER — Ambulatory Visit (INDEPENDENT_AMBULATORY_CARE_PROVIDER_SITE_OTHER): Payer: Medicaid Other | Admitting: Internal Medicine

## 2014-10-29 VITALS — BP 157/66 | HR 88 | Temp 98.3°F | Ht 71.0 in | Wt 226.3 lb

## 2014-10-29 DIAGNOSIS — I10 Essential (primary) hypertension: Secondary | ICD-10-CM | POA: Diagnosis not present

## 2014-10-29 DIAGNOSIS — N400 Enlarged prostate without lower urinary tract symptoms: Secondary | ICD-10-CM | POA: Diagnosis not present

## 2014-10-29 DIAGNOSIS — N50811 Right testicular pain: Secondary | ICD-10-CM | POA: Diagnosis present

## 2014-10-29 DIAGNOSIS — Z8619 Personal history of other infectious and parasitic diseases: Secondary | ICD-10-CM | POA: Diagnosis not present

## 2014-10-29 DIAGNOSIS — Z7982 Long term (current) use of aspirin: Secondary | ICD-10-CM | POA: Insufficient documentation

## 2014-10-29 DIAGNOSIS — Z8739 Personal history of other diseases of the musculoskeletal system and connective tissue: Secondary | ICD-10-CM | POA: Diagnosis not present

## 2014-10-29 DIAGNOSIS — Z79899 Other long term (current) drug therapy: Secondary | ICD-10-CM | POA: Insufficient documentation

## 2014-10-29 DIAGNOSIS — Z87891 Personal history of nicotine dependence: Secondary | ICD-10-CM | POA: Diagnosis not present

## 2014-10-29 DIAGNOSIS — Z8613 Personal history of malaria: Secondary | ICD-10-CM | POA: Diagnosis not present

## 2014-10-29 DIAGNOSIS — N5089 Other specified disorders of the male genital organs: Secondary | ICD-10-CM | POA: Diagnosis present

## 2014-10-29 DIAGNOSIS — H269 Unspecified cataract: Secondary | ICD-10-CM | POA: Diagnosis not present

## 2014-10-29 DIAGNOSIS — N453 Epididymo-orchitis: Secondary | ICD-10-CM | POA: Diagnosis not present

## 2014-10-29 LAB — COMPREHENSIVE METABOLIC PANEL
ALK PHOS: 72 U/L (ref 38–126)
ALT: 23 U/L (ref 17–63)
AST: 26 U/L (ref 15–41)
Albumin: 3.8 g/dL (ref 3.5–5.0)
Anion gap: 10 (ref 5–15)
BILIRUBIN TOTAL: 1.8 mg/dL — AB (ref 0.3–1.2)
BUN: 14 mg/dL (ref 6–20)
CALCIUM: 9.7 mg/dL (ref 8.9–10.3)
CO2: 25 mmol/L (ref 22–32)
CREATININE: 0.99 mg/dL (ref 0.61–1.24)
Chloride: 100 mmol/L — ABNORMAL LOW (ref 101–111)
GFR calc non Af Amer: >60 mL/min (ref >60)
Glucose, Bld: 124 mg/dL — ABNORMAL HIGH (ref 65–99)
Potassium: 3.8 mmol/L (ref 3.5–5.1)
SODIUM: 135 mmol/L (ref 135–145)
Total Protein: 8.1 g/dL (ref 6.5–8.1)

## 2014-10-29 LAB — CBC WITH DIFFERENTIAL/PLATELET
BASOS ABS: 0.1 10*3/uL (ref 0.0–0.1)
BASOS PCT: 1 %
EOS ABS: 0.1 10*3/uL (ref 0.0–0.7)
Eosinophils Relative: 1 %
HCT: 46.1 % (ref 39.0–52.0)
HEMOGLOBIN: 16.3 g/dL (ref 13.0–17.0)
Lymphocytes Relative: 19 %
Lymphs Abs: 1.6 10*3/uL (ref 0.7–4.0)
MCH: 31.4 pg (ref 26.0–34.0)
MCHC: 35.4 g/dL (ref 30.0–36.0)
MCV: 88.8 fL (ref 78.0–100.0)
MONO ABS: 1.2 10*3/uL — AB (ref 0.1–1.0)
MONOS PCT: 14 %
NEUTROS PCT: 65 %
Neutro Abs: 5.7 10*3/uL (ref 1.7–7.7)
PLATELETS: 155 10*3/uL (ref 150–400)
RBC: 5.19 MIL/uL (ref 4.22–5.81)
RDW: 13 % (ref 11.5–15.5)
WBC: 8.7 10*3/uL (ref 4.0–10.5)

## 2014-10-29 LAB — URINALYSIS, ROUTINE W REFLEX MICROSCOPIC
Glucose, UA: NEGATIVE mg/dL
KETONES UR: NEGATIVE mg/dL
NITRITE: POSITIVE — AB
Protein, ur: 100 mg/dL — AB
SPECIFIC GRAVITY, URINE: 1.023 (ref 1.005–1.030)
UROBILINOGEN UA: 1 mg/dL (ref 0.0–1.0)
pH: 6 (ref 5.0–8.0)

## 2014-10-29 LAB — URINE MICROSCOPIC-ADD ON

## 2014-10-29 LAB — I-STAT BETA HCG BLOOD, ED (MC, WL, AP ONLY)

## 2014-10-29 MED ORDER — ONDANSETRON HCL 4 MG/2ML IJ SOLN
4.0000 mg | INTRAMUSCULAR | Status: AC
  Administered 2014-10-29: 4 mg via INTRAVENOUS
  Filled 2014-10-29: qty 2

## 2014-10-29 MED ORDER — TRAMADOL HCL 50 MG PO TABS: 50.0000 mg | ORAL_TABLET | Freq: Four times a day (QID) | ORAL | Status: DC | PRN

## 2014-10-29 MED ORDER — MORPHINE SULFATE (PF) 4 MG/ML IV SOLN
4.0000 mg | Freq: Once | INTRAVENOUS | Status: AC
  Administered 2014-10-29: 4 mg via INTRAVENOUS
  Filled 2014-10-29: qty 1

## 2014-10-29 MED ORDER — ACETAMINOPHEN 325 MG PO TABS
650.0000 mg | ORAL_TABLET | Freq: Once | ORAL | Status: AC
  Administered 2014-10-29: 650 mg via ORAL
  Filled 2014-10-29: qty 2

## 2014-10-29 MED ORDER — CIPROFLOXACIN HCL 500 MG PO TABS: 500.0000 mg | ORAL_TABLET | Freq: Two times a day (BID) | ORAL | Status: DC

## 2014-10-29 MED ORDER — CIPROFLOXACIN HCL 500 MG PO TABS
500.0000 mg | ORAL_TABLET | Freq: Once | ORAL | Status: AC
  Administered 2014-10-29: 500 mg via ORAL
  Filled 2014-10-29: qty 1

## 2014-10-29 NOTE — Discharge Instructions (Signed)
Epididymitis °Epididymitis is swelling (inflammation) of the epididymis. The epididymis is a cord-like structure that is located along the top and back part of the testicle. It collects and stores sperm from the testicle. °This condition can also cause pain and swelling of the testicle and scrotum. Symptoms usually start suddenly (acute epididymitis). Sometimes epididymitis starts gradually and lasts for a while (chronic epididymitis). This type may be harder to treat. °CAUSES °In men 35 and younger, this condition is usually caused by a bacterial infection or sexually transmitted disease (STD), such as: °· Gonorrhea. °· Chlamydia.   °In men 35 and older who do not have anal sex, this condition is usually caused by bacteria from a blockage or abnormalities in the urinary system. These can result from: °· Having a tube placed into the bladder (urinary catheter). °· Having an enlarged or inflamed prostate gland. °· Having recent urinary tract surgery. °In men who have a condition that weakens the body's defense system (immune system), such as HIV, this condition can be caused by:  °· Other bacteria, including tuberculosis and syphilis. °· Viruses. °· Fungi. °Sometimes this condition occurs without infection. That may happen if urine flows backward into the epididymis after heavy lifting or straining. °RISK FACTORS °This condition is more likely to develop in men: °· Who have unprotected sex with more than one partner. °· Who have anal sex.   °· Who have recently had surgery.   °· Who have a urinary catheter. °· Who have urinary problems. °· Who have a suppressed immune system.   °SYMPTOMS  °This condition usually begins suddenly with chills, fever, and pain behind the scrotum and in the testicle. Other symptoms include:  °· Swelling of the scrotum, testicle, or both. °· Pain when ejaculating or urinating. °· Pain in the back or belly. °· Nausea. °· Itching and discharge from the penis. °· Frequent need to pass  urine. °· Redness and tenderness of the scrotum. °DIAGNOSIS °Your health care provider can diagnose this condition based on your symptoms and medical history. Your health care provider will also do a physical exam to ask about your symptoms and check your scrotum and testicle for swelling, pain, and redness. You may also have other tests, including:   °· Examination of discharge from the penis. °· Urine tests for infections, such as STDs.   °Your health care provider may test you for other STDs, including HIV.  °TREATMENT °Treatment for this condition depends on the cause. If your condition is caused by a bacterial infection, oral antibiotic medicine may be prescribed. If the bacterial infection has spread to your blood, you may need to receive IV antibiotics. Nonbacterial epididymitis is treated with home care that includes bed rest and elevation of the scrotum. °Surgery may be needed to treat: °· Bacterial epididymitis that causes pus to build up in the scrotum (abscess). °· Chronic epididymitis that has not responded to other treatments. °HOME CARE INSTRUCTIONS °Medicines  °· Take over-the-counter and prescription medicines only as told by your health care provider.   °· If you were prescribed an antibiotic medicine, take it as told by your health care provider. Do not stop taking the antibiotic even if your condition improves. °Sexual Activity  °· If your epididymitis was caused by an STD, avoid sexual activity until your treatment is complete. °· Inform your sexual partner or partners if you test positive for an STD. They may need to be treated. Do not engage in sexual activity with your partner or partners until their treatment is completed. °General Instructions  °· Return to your normal activities as told   by your health care provider. Ask your health care provider what activities are safe for you. °· Keep your scrotum elevated and supported while resting. Ask your health care provider if you should wear a  scrotal support, such as a jockstrap. Wear it as told by your health care provider. °· If directed, apply ice to the affected area:   °¨ Put ice in a plastic bag. °¨ Place a towel between your skin and the bag. °¨ Leave the ice on for 20 minutes, 2-3 times per day. °· Try taking a sitz bath to help with discomfort. This is a warm water bath that is taken while you are sitting down. The water should only come up to your hips and should cover your buttocks. Do this 3-4 times per day or as told by your health care provider. °· Keep all follow-up visits as told by your health care provider. This is important. °SEEK MEDICAL CARE IF:  °· You have a fever.   °· Your pain medicine is not helping.   °· Your pain is getting worse.   °· Your symptoms do not improve within three days. °  °This information is not intended to replace advice given to you by your health care provider. Make sure you discuss any questions you have with your health care provider. °  °Document Released: 12/18/1999 Document Revised: 09/10/2014 Document Reviewed: 05/07/2014 °Elsevier Interactive Patient Education ©2016 [employer] ° °Orchitis °Orchitis is swelling (inflammation) of a testicle caused by infection. Testicles are the male organs that produce sperm. The testicles are held in a fleshy sac (scrotum) located behind the penis. Orchitis usually affects only one testicle, but it can occur in both. The condition can develop suddenly. °Orchitis can be caused by many different kinds of bacteria and viruses. °CAUSES °Orchitis can be caused by either a bacterial or viral infection. °Bacterial Infections °· These often occur along with an infection of the coiled tube that collects sperm and sits on top of the testicle (epididymis). °· In men who are not sexually active, bacterial orchitis usually starts as a urinary tract infection and spreads to the testicle. °· In sexually active men, sexually transmitted infections are the most common cause of  bacterial orchitis. These can include: °¨ Gonorrhea. °¨ Chlamydia. °Viral Infections °· Mumps is still the most common cause of viral orchitis, though mumps is now rare in many areas because of vaccination. °· Other viruses that can cause orchitis include: °¨ The chickenpox virus (varicella-zoster virus). °¨ The virus that causes mononucleosis (Epstein-Barr virus). °RISK FACTORS °Boys and men who have not been vaccinated against mumps are at risk for mumps orchitis. °Risk factors for bacterial orchitis include: °· Frequent urinary tract infections. °· High-risk sexual behaviors. °· Having a sexual partner with a sexually transmitted infection. °· Having had urinary tract surgery. °· Using a tube passed through the penis to drain urine (Foley catheter). °· An enlarged prostate gland. °SIGNS AND SYMPTOMS °The most common symptoms of orchitis are swelling and pain in the scrotum. Other signs and symptoms may include: °· Feeling generally sick (malaise). °· Fever and chills. °· Painful urination. °· Painful ejaculation. °· Blood or discharge from the penis. °· Nausea. °· Headache. °· Fatigue. °DIAGNOSIS °Your health care provider may suspect orchitis if you have a painful, swollen testicle along with other signs and symptoms of the condition. A physical exam will be done. Tests may also be done to help your health care provider make a diagnosis. These may include: °· A blood test   to check for signs of infection. °· A urine test to check for a urinary tract infection. °· Using a swab to collect a fluid sample from the tip of the penis to test for sexually transmitted infections. °· Taking an image of the testicle using sound waves and a computer (testicular ultrasound). °TREATMENT °Treatment of orchitis depends on the cause. For orchitis caused by a bacterial infection, your health care provider will most likely prescribe antibiotic medicines. Bacterial infections usually clear up within a few days. °Both viral  infections and bacterial infections may be treated with: °· Bed rest. °· Anti-inflammatory medicines. °· Pain medicines. °· Elevating the scrotum and applying ice. °HOME CARE INSTRUCTIONS °· Rest as directed by your health care provider. °· Take medicines only as directed by your health care provider. °· If you were prescribed an antibiotic medicine, finish it all even if you start to feel better. °· Elevate your scrotum and apply ice as directed: °¨ Put ice in a plastic bag. °¨ Place a small towel or pillow between your legs. °¨ Rest your scrotum on the pillow or towel. °¨ Place another towel between your skin and the plastic bag. °¨ Leave the ice on for 20 minutes, 2-3 times a day. °SEEK MEDICAL CARE IF: °· You have a fever. °· Pain and swelling have not gotten better after 3 days. °SEEK IMMEDIATE MEDICAL CARE IF: °· Your pain is getting worse. °· The swelling in your testicle gets worse. °  °This information is not intended to replace advice given to you by your health care provider. Make sure you discuss any questions you have with your health care provider. °  °Document Released: 12/18/1999 Document Revised: 01/10/2014 Document Reviewed: 05/09/2013 °Elsevier Interactive Patient Education ©2016 [employer]. ° °

## 2014-10-29 NOTE — ED Notes (Signed)
Pt still in [place].  Pt's family provided with beverages.

## 2014-10-29 NOTE — ED Notes (Signed)
PA at bedside.

## 2014-10-29 NOTE — ED Provider Notes (Signed)
CSN: [id number]     Arrival date & time 10/29/14  1659 History   First MD Initiated Contact with Patient 10/29/14 1828     Chief Complaint  Patient presents with  . Groin Swelling  . Fever   Stephen Hendricks is a 74 y.o. male with a history of hypertension, BPH, and prostatitis who presents to the emergency department complaining of right testicle. Swallowing for the past 3 days. He reports he had sudden onset of pain beginning Monday. He notes pain and swelling. He reports his pain is currently 2 out of 10. He has had nothing for treatment today. He reports he's also had a fever and chills at home. He reports the highest temperature at home was 98. The patient denies dysuria, hematuria, urine frequency, urinary urgency, rectal pain, nausea, vomiting, diarrhea, hematochezia, abdominal pain, penile pain, or penile discharge.  (Consider location/radiation/quality/duration/timing/severity/associated sxs/prior Treatment) HPI  Past Medical History  Diagnosis Date  . HTN (hypertension)   . BPH (benign prostatic hyperplasia)   . LBBB (left bundle branch block)   . Frozen shoulder   . Right tennis elbow   . Hx of prostatitis 3.20.16    WITH E.COLI  . Cataract of right eye   . Malaria     [place]  . Leishman[place]     [place]  . Oral candidiasis     [place]  . SOB (shortness of breath)    Past Surgical History  Procedure Laterality Date  . 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 Treatment fistula anal  1980    pt really couldn't remember   Family History  Problem Relation Age of Onset  . Other Father     PROSTATE DISEASE  . Cancer Daughter     BREAST  . Heart attack Neg Hx   . Hypertension Neg Hx   . Stroke Neg Hx   . Cardiomyopathy Mother    Social History  Substance Use Topics  . Smoking status: Former Smoker    Start date: 01/04/1956    Quit date: 01/04/1991  . Smokeless tobacco: Not on file  . Alcohol Use: 1.8 - 2.4 oz/week    3-4 Standard  drinks or equivalent per week     Comment: ALSO DRINKS BEER    Review of Systems  Constitutional: Positive for fever and chills.  HENT: Negative for congestion and sore throat.   Eyes: Negative for visual disturbance.  Respiratory: Negative for cough and shortness of breath.   Cardiovascular: Negative for chest pain.  Gastrointestinal: Negative for nausea, vomiting, abdominal pain, diarrhea and blood in stool.  Genitourinary: Positive for scrotal swelling and testicular pain. Negative for dysuria, urgency, frequency, hematuria, decreased urine volume, discharge, penile swelling, difficulty urinating, genital sores and penile pain.  Musculoskeletal: Negative for back pain and neck pain.  Skin: Negative for rash.  Neurological: Negative for headaches.      Allergies  Review of patient's allergies indicates no known allergies.  Home Medications   Prior to Admission medications   Medication Sig Start Date End Date Taking? Authorizing Provider  amLODipine (NORVASC) 5 MG tablet Take 1 tablet (5 mg total) by mouth daily. 10/16/14  Yes Darreld Mclean, MD  aspirin 81 MG tablet Take 81 mg by mouth daily.   Yes Historical Provider, MD  atorvastatin (LIPITOR) 40 MG tablet Take 1 tablet (40 mg total) by mouth daily. 10/03/14  Yes Dwana Melena, PA-C  candesartan (ATACAND) 8 MG tablet Take 1 tablet (8  mg total) by mouth daily. 10/16/14  Yes Darreld Mclean, MD  finasteride (PROSCAR) 5 MG tablet Take 5 mg by mouth daily.   Yes Historical Provider, MD  sodium chloride (OCEAN) 0.65 % nasal spray Place 1 spray into the nose daily as needed for congestion.   Yes Historical Provider, MD  tamsulosin (FLOMAX) 0.4 MG CAPS capsule Take 2 capsules (0.8 mg total) by mouth daily. 09/11/14  Yes Darreld Mclean, MD  ciprofloxacin (CIPRO) 500 MG tablet Take 1 tablet (500 mg total) by mouth every 12 (twelve) hours. 10/29/14   Everlene Farrier, PA-C  traMADol (ULTRAM) 50 MG tablet Take 1 tablet (50 mg total) by mouth every 6  (six) hours as needed. 10/29/14   Everlene Farrier, PA-C   BP 140/64 mmHg  Pulse 85  Temp(Src) 101.7 F (38.7 C) (Oral)  Resp 18  Ht  (1.88 m)  Wt 226 lb (102.513 kg)  BMI 29.00 kg/m2  SpO2 94% Physical Exam  Constitutional: He appears well-developed and well-nourished. No distress.  Nontoxic appearing.  HENT:  Head: Normocephalic and atraumatic.  Mouth/Throat: Oropharynx is clear and moist.  Eyes: Conjunctivae are normal. Pupils are equal, round, and reactive to light. Right eye exhibits no discharge. Left eye exhibits no discharge.  Neck: Neck supple.  Cardiovascular: Normal rate, regular rhythm, normal heart sounds and intact distal pulses.  Exam reveals no gallop and no friction rub.   No murmur heard. Pulmonary/Chest: Effort normal and breath sounds normal. No respiratory distress. He has no wheezes. He has no rales.  Abdominal: Soft. Bowel sounds are normal. He exhibits no distension and no mass. There is no tenderness. There is no guarding.  Abdomen is soft and nontender to palpation. Bowel sounds are present.  Genitourinary: Penis normal. Right testis shows swelling and tenderness. Cremasteric reflex is absent on the right side. No penile tenderness. No discharge found.  Patient's right testicle is tender to palpation and edematous. There is also tenderness along his right inguinal canal. No masses appreciated. No right-sided cremasteric reflex. No penile tenderness or discharge.  Musculoskeletal: He exhibits no edema.  Lymphadenopathy:    He has no cervical adenopathy.  Neurological: He is alert. Coordination normal.  Skin: Skin is warm and dry. No rash noted. He is not diaphoretic. No erythema. No pallor.  Psychiatric: He has a normal mood and affect. His behavior is normal.  Nursing note and vitals reviewed.   ED Course  Procedures (including critical care time) Labs Review Labs Reviewed  COMPREHENSIVE METABOLIC PANEL - Abnormal; Notable for the following:     Chloride 100 (*)    Glucose, Bld 124 (*)    Total Bilirubin 1.8 (*)    All other components within normal limits  CBC WITH DIFFERENTIAL/PLATELET - Abnormal; Notable for the following:    Monocytes Absolute 1.2 (*)    All other components within normal limits  URINALYSIS, ROUTINE W REFLEX MICROSCOPIC (NOT AT [facility]) - Abnormal; Notable for the following:    Color, Urine AMBER (*)    APPearance CLOUDY (*)    Hgb urine dipstick LARGE (*)    Bilirubin Urine SMALL (*)    Protein, ur 100 (*)    Nitrite POSITIVE (*)    Leukocytes, UA LARGE (*)    All other components within normal limits  URINE MICROSCOPIC-ADD ON - Abnormal; Notable for the following:    Bacteria, UA MANY (*)    All other components within normal limits  URINE CULTURE  I-STAT BETA HCG BLOOD, ED (  [facility], [facility], [facility] ONLY)    Imaging Review Kore[place] Scrotum  10/29/2014  CLINICAL DATA:  Right testicular swelling for 3 days with fever and pain. EXAM: SCROTAL ULTRASOUND DOPPLER ULTRASOUND OF THE TESTICLES TECHNIQUE: Complete ultrasound examination of the testicles, epididymis, and other scrotal structures was performed. Color and spectral Doppler ultrasound were also utilized to evaluate blood flow to the testicles. COMPARISON:  None. FINDINGS: Right testicle Measurements: 2.9 x 2.5 x 2.6 cm. No mass or microlithiasis visualized. The right testicle is mildly heterogeneous with increased vascular flow. Scrotal thickening adjacent to the right testicle. Left testicle Measurements: 3.7 x 1.8 x 2.2 cm. No mass or microlithiasis visualized. Right epididymis: The right epididymis is enlarged and heterogeneous with increased vascular flow. Appears to be an epididymal appendix measuring up to 0.8 cm. Left epididymis:  Normal in size and appearance. Hydrocele:  There is small complex right hydrocele. Varicocele:  Bilateral varicoceles, left side greater than right. Pulsed Doppler interrogation of both testes demonstrates normal low resistance arterial and  venous waveforms bilaterally. IMPRESSION: The right epididymis is enlarged and there is increased vascular flow to the right testicle and right epididymis. There is also a complex right hydrocele. Findings are most compatible with acute right epididymo-orchitis. Bilateral varicoceles, left side greater than right. Negative for testicular torsion. Electronically Signed   By: Richarda OverlieAdam  Henn M.D.   On: 10/29/2014 19:38   [place] Art/ven Flow Abd Pelv Doppler  10/29/2014  CLINICAL DATA:  Right testicular swelling for 3 days with fever and pain. EXAM: SCROTAL ULTRASOUND DOPPLER ULTRASOUND OF THE TESTICLES TECHNIQUE: Complete ultrasound examination of the testicles, epididymis, and other scrotal structures was performed. Color and spectral Doppler ultrasound were also utilized to evaluate blood flow to the testicles. COMPARISON:  None. FINDINGS: Right testicle Measurements: 2.9 x 2.5 x 2.6 cm. No mass or microlithiasis visualized. The right testicle is mildly heterogeneous with increased vascular flow. Scrotal thickening adjacent to the right testicle. Left testicle Measurements: 3.7 x 1.8 x 2.2 cm. No mass or microlithiasis visualized. Right epididymis: The right epididymis is enlarged and heterogeneous with increased vascular flow. Appears to be an epididymal appendix measuring up to 0.8 cm. Left epididymis:  Normal in size and appearance. Hydrocele:  There is small complex right hydrocele. Varicocele:  Bilateral varicoceles, left side greater than right. Pulsed Doppler interrogation of both testes demonstrates normal low resistance arterial and venous waveforms bilaterally. IMPRESSION: The right epididymis is enlarged and there is increased vascular flow to the right testicle and right epididymis. There is also a complex right hydrocele. Findings are most compatible with acute right epididymo-orchitis. Bilateral varicoceles, left side greater than right. Negative for testicular torsion. Electronically Signed   By: Richarda OverlieAdam   Henn M.D.   On: 10/29/2014 19:38   I have personally reviewed and evaluated these images and lab results as part of my medical decision-making.   EKG Interpretation None      Filed Vitals:   10/29/14 1724 10/29/14 2014 10/29/14 2045  BP: 150/79  140/64  Pulse: 97  85  Temp: 100.1 F (37.8 C) 101.7 F (38.7 C)   TempSrc: Oral Oral   Resp: 18    Height: 6\' 2"  (1.88 m)    Weight: 226 lb (102.513 kg)    SpO2: 96%  94%     MDM   Meds given in ED:  Medications  morphine 4 MG/ML injection 4 mg (4 mg Intravenous Given 10/29/14 2002)  ondansetron (ZOFRAN) injection 4 mg (4 mg Intravenous Given 10/29/14 2001)  ciprofloxacin (CIPRO) tablet 500 mg (500 mg Oral Given 10/29/14 2015)  acetaminophen (TYLENOL) tablet 650 mg (650 mg Oral Given 10/29/14 2015)    Discharge Medication List as of 10/29/2014  8:32 PM    START taking these medications   Details  ciprofloxacin (CIPRO) 500 MG tablet Take 1 tablet (500 mg total) by mouth every 12 (twelve) hours., Starting 10/29/2014, Until Discontinued, Print    traMADol (ULTRAM) 50 MG tablet Take 1 tablet (50 mg total) by mouth every 6 (six) hours as needed., Starting 10/29/2014, Until Discontinued, Print        Final diagnoses:  Epididymo-orchitis, acute   This is a 74 y.o. male with a history of hypertension, BPH, and prostatitis who presents to the emergency department complaining of right testicle. Swallowing for the past 3 days. He reports he had sudden onset of pain beginning Monday. He notes pain and swelling. He reports his pain is currently 2 out of 10. He has had nothing for treatment today. He reports he's also had a fever and chills at home. He denies rectal pain. He reports a history of prostatitis reports this feels different. On exam the patient has a temperature of 100.1. His abdomen is soft and nontender to palpation. On exam his right testicle is swollen and tender to palpation. No penile discharge or tenderness. No erythema or  warmth. Urinalysis indicates nitrite-positive urine with large leukocytes. CBC shows no leukocytosis. Scrotal ultrasound indicates a right epididymo-orchitis. Prior to discharge the patient reports his pain is resolved after pain medicine in the emergency department. We'll discharge a prescription for Cipro for 21 days as well as tramadol for pain. I advised him of like him to follow-up with urologist Dr. Marnee Guarneri. I advised the patient to follow-up with their primary care provider this week. I advised the patient to return to the emergency department with new or worsening symptoms or new concerns. The patient verbalized understanding and agreement with plan.    This patient was discussed with Dr. Corlis Leak who agrees with assessment and plan.   Everlene Farrier, PA-C 10/30/14 1539  Courteney Randall An, MD 10/30/14 2254

## 2014-10-29 NOTE — Assessment & Plan Note (Addendum)
Assessment: Patient with acute onset of right testicle pain since Monday.  On exam, testicle is tender to palpation more anteriorly, cresmasteric reflex is absent, testicle is high-riding on the right.  There is no urethral discharge, no erythema of the area.  Differential includes epididymitis, testicular torsion, Fournier's gangrene, orchitis.  Fourniers unlikely as there does not appear to be a necrotizing infection (no tense edema, no blisters or bullae, no crepitus or subcutaneous gas), and no systemic symptoms of fever, tachycardia or hypotension.  Orchitis alone is very uncommon in adults so possibly epididymo-orchitis.  With history of prostatitis, epididymitis is definitely a possibility.   However, torsion cannot be ruled out given absent cremaster reflex, high-riding testicle.   Plan: -cannot rule out torsion in the clinic so will send patient to the ED for evaluation via ultrasound.  If torsion is ruled out, would treat as suspected epididymitis with ice, scrotal elevation and antibiotics such as Levofloxacin 500mg  daily for 10 days. -recommend possible urology referral in the future

## 2014-10-29 NOTE — Progress Notes (Signed)
Patient ID: Stephen Hendricks, male   DOB: 12-18-1940, 74 y.o.   MRN: [id number]   Subjective:   Patient ID: Stephen Hendricks male   DOB: 12-18-1940 74 y.o.   MRN: [id number]  HPI: [name] is a 74 y.o. male with past medical hx of HTN, BPH, LBBB, history of prostatitis who [facility] with acute complaint of right testicular pain.  Patient states pain began acutely on Monday and denies a prior history of this type pain.  States his testicle is painful, swollen, and tender to the touch.  States it is worse in the morning and eases off throughout the day.  Rates pain as about 9/10 at its worse.  Otherwise typically 5 or 6/10.  States pain is associated with bilateral flank pain since yesterday as well as right-sided inguinal pain since yesterday that radiates from the right testicle.  Patient reports no relief with urination, no dysuria.  Reports subjective fever, chills, fatigue, and increased frequency of urination.  Has attempted to control fever and pain at home with Tylenol.  He is afebrile today.  Negative for nausea, vomiting, or discharge.  Reports he had episode of prostatitis in April that was treated with high-powered antibiotics for several weeks.  [place] so [place].  Denies any trauma to his testicular area.    Past Medical History  Diagnosis Date  . HTN (hypertension)   . BPH (benign prostatic hyperplasia)   . LBBB (left bundle branch block)   . Frozen shoulder   . Right tennis elbow   . Hx of prostatitis 3.20.16    WITH E.COLI  . Cataract of right eye   . Malaria     [place]  . Leishmaniasis     [place]  . Oral candidiasis     [place]  . SOB (shortness of breath)    Current Outpatient Prescriptions  Medication Sig Dispense Refill  . amLODipine (NORVASC) 5 MG tablet Take 1 tablet (5 mg total) by mouth daily. 30 tablet 5  . aspirin 81 MG tablet Take 81 mg by mouth daily.    Marland Kitchen. atorvastatin  (LIPITOR) 40 MG tablet Take 1 tablet (40 mg total) by mouth daily. 90 tablet 3  . candesartan (ATACAND) 8 MG tablet Take 1 tablet (8 mg total) by mouth daily. 30 tablet 5  . finasteride (PROSCAR) 5 MG tablet Take 5 mg by mouth daily.    . sodium chloride (OCEAN) 0.65 % nasal spray Place 1 spray into the nose daily as needed for congestion.    . tamsulosin (FLOMAX) 0.4 MG CAPS capsule Take 2 capsules (0.8 mg total) by mouth daily. 60 capsule 2   No current facility-administered medications for this visit.   Family History  Problem Relation Age of Onset  . Other Father     PROSTATE DISEASE  . Cancer Daughter     BREAST  . Heart attack Neg Hx   . Hypertension Neg Hx   . Stroke Neg Hx   . Cardiomyopathy Mother    Social History   Social History  . Marital Status: Married    Spouse Name: N/A  . Number of Children: N/A  . Years of Education: N/A   Social History Main Topics  . Smoking status: Former Smoker    Start date: 01/04/1956    Quit date: 01/04/1991  . Smokeless tobacco: None  . Alcohol Use: 1.8 - 2.4 oz/week    3-4 Standard  drinks or equivalent per week     Comment: ALSO DRINKS BEER  . Drug Use: None  . Sexual Activity: Not Asked   Other Topics Concern  . None   Social History Narrative   Review of Systems: Pertinent items are noted in HPI. Objective:  Physical Exam: Filed Vitals:   10/29/14 1542  BP: 157/66  Pulse: 88  Temp: 98.3 F (36.8 C)  TempSrc: Oral  Height:  (1.803 m)  Weight: 226 lb 4.8 oz (102.649 kg)  SpO2: 99%   Physical Exam  Constitutional: He is oriented to person, place, and time. He appears well-developed and well-nourished. No distress.  HENT:  Head: Normocephalic and atraumatic.  Eyes: EOM are normal.  Neck: Normal range of motion.  Cardiovascular: Normal rate, regular rhythm and normal heart sounds.   Respiratory: Effort normal and breath sounds normal.  GI: Soft. Bowel sounds are normal. There is no tenderness.    Genitourinary:  Right testicle is firm, greater in size than left testicle.  No warmth or eythema noted.  Right testicle is high riding, cremasteric reflex is absent.  Testicle is more tender anteriorly.  Tenderness present in inguinal crease on the right without LAD.  Neurological: He is alert and oriented to person, place, and time.  Skin: Skin is warm and dry.  Psychiatric: He has a normal mood and affect.     Assessment & Plan:  Please see Problem List for Assessment and Plan

## 2014-10-29 NOTE — ED Notes (Signed)
Pt c/o right testicle swelling x 3 days and fever

## 2014-10-31 LAB — URINE CULTURE

## 2014-11-01 ENCOUNTER — Telehealth (HOSPITAL_BASED_OUTPATIENT_CLINIC_OR_DEPARTMENT_OTHER): Payer: Self-pay | Admitting: Emergency Medicine

## 2014-11-01 NOTE — Telephone Encounter (Signed)
Post ED Visit - Positive Culture Follow-up  Culture report reviewed by antimicrobial stewardship pharmacist:  []  Celedonio MiyamotoJeremy Frens, Pharm.D., BCPS []  Georgina PillionElizabeth Martin, [address] BCPS []  SellersvilleMinh Pham, [address]Ph[address]D., BCPS, AAHIVP []  Estella HuskMichelle Turner, Pharm.D., BCPS, AAHIVP []  Colgate PalmoliveCristy Reyes, [address]Pharm[address]D. []  Tennis Mustassie Stewart, [place]arm[place]D. Garvin FilaMike Maccia PharmD  Positive urine culture E. coli Treated with ciprofloxacin, organism sensitive to the same and no further patient follow-up is required at this time.  Berle MullMiller, [place] 11/01/2014, 10:03 AM

## 2014-11-03 NOTE — Progress Notes (Signed)
I saw and evaluated the patient. I personally confirmed the key portions of Dr. Philis PiqueWallace's history and exam and reviewed pertinent patient test results. The assessment, diagnosis, and plan were formulated together and I agree with the documentation in the resident's note.  Unfortunately, we were unable to get a doppler ultrasound and were told by Radiology that he would have to be sent to the ED to get this study to rule out a testicular torsion.

## 2014-11-05 ENCOUNTER — Ambulatory Visit: Payer: Medicaid Other | Admitting: Internal Medicine

## 2014-11-14 ENCOUNTER — Other Ambulatory Visit (INDEPENDENT_AMBULATORY_CARE_PROVIDER_SITE_OTHER): Payer: Medicaid Other | Admitting: *Deleted

## 2014-11-14 DIAGNOSIS — I10 Essential (primary) hypertension: Secondary | ICD-10-CM

## 2014-11-14 DIAGNOSIS — E785 Hyperlipidemia, unspecified: Secondary | ICD-10-CM

## 2014-11-14 LAB — HEPATIC FUNCTION PANEL
ALK PHOS: 62 U/L (ref 40–115)
ALT: 31 U/L (ref 9–46)
AST: 29 U/L (ref 10–35)
Albumin: 3.8 g/dL (ref 3.6–5.1)
BILIRUBIN DIRECT: 0.4 mg/dL — AB (ref <=0.2)
BILIRUBIN INDIRECT: 0.8 mg/dL (ref 0.2–1.2)
Total Bilirubin: 1.2 mg/dL (ref 0.2–1.2)
Total Protein: 7.7 g/dL (ref 6.1–8.1)

## 2014-11-14 LAB — LIPID PANEL
CHOL/HDL RATIO: 1.9 ratio (ref <=5.0)
CHOLESTEROL: 132 mg/dL (ref 125–200)
HDL: 70 mg/dL (ref >=40)
LDL Cholesterol: 49 mg/dL (ref <130)
Triglycerides: 66 mg/dL (ref <150)
VLDL: 13 mg/dL (ref <30)

## 2014-11-14 NOTE — Addendum Note (Signed)
Addended by: Tonita PhoenixBOWDEN, Avyn Aden K on: 11/14/2014 09:18 AM   Modules accepted: Orders

## 2014-12-03 ENCOUNTER — Other Ambulatory Visit: Payer: Self-pay | Admitting: Internal Medicine

## 2014-12-03 NOTE — Telephone Encounter (Signed)
Called pharm, they state that they rec'd the amlodipine in October electronically but not the candesartan, gave pharm script verbally via ph

## 2014-12-03 NOTE — Telephone Encounter (Signed)
Patient requesting a refill on his Candesartan

## 2014-12-10 ENCOUNTER — Other Ambulatory Visit (HOSPITAL_COMMUNITY): Payer: Self-pay | Admitting: Urology

## 2014-12-10 DIAGNOSIS — N281 Cyst of kidney, acquired: Secondary | ICD-10-CM

## 2014-12-12 ENCOUNTER — Telehealth: Payer: Self-pay | Admitting: *Deleted

## 2014-12-12 NOTE — Telephone Encounter (Signed)
Received PA request from pt's pharmacy-candesartan cilexetil 8mg  tabs nonpreferred. Medicaid first requires documented trial and failure of ACE Inhibitor unless contraindicated or adverse event, even when using preferred product.  Preferred ARBs are Diovan Tablet (brand name) and losartan tablet.  Of note,  medicaid preferred ACE Inhibitors include the following meds :benazepril (Lotensin), captopril (Capoten), enalapril (Vasotec), ramipril (Altace), and lisinopril (Prinivil and Zestril). Pt was contacted and informed there may be a change to his medications.  Will forward info to pcp for review and possibel medication change. Please advise.[name], Thelmer Legler Cassady12/9/201612:15 PM

## 2014-12-17 ENCOUNTER — Other Ambulatory Visit: Payer: Self-pay | Admitting: Internal Medicine

## 2014-12-17 MED ORDER — LISINOPRIL 20 MG PO TABS: 20.0000 mg | ORAL_TABLET | Freq: Every day | ORAL | Status: DC

## 2014-12-17 NOTE — Telephone Encounter (Signed)
Have sent a prescription for Lisinopril 20 mg to patient's pharmacy.

## 2014-12-24 ENCOUNTER — Ambulatory Visit (HOSPITAL_COMMUNITY)
Admission: RE | Admit: 2014-12-24 | Discharge: 2014-12-24 | Disposition: A | Discharge status: Home / Self Care | Payer: Medicaid Other | Source: Ambulatory Visit | Attending: Urology | Admitting: Urology

## 2014-12-24 ENCOUNTER — Ambulatory Visit (INDEPENDENT_AMBULATORY_CARE_PROVIDER_SITE_OTHER): Payer: Medicaid Other | Admitting: Internal Medicine

## 2014-12-24 ENCOUNTER — Other Ambulatory Visit (HOSPITAL_COMMUNITY): Payer: Self-pay | Admitting: Urology

## 2014-12-24 ENCOUNTER — Encounter: Payer: Self-pay | Admitting: Internal Medicine

## 2014-12-24 VITALS — BP 151/77 | HR 74 | Temp 97.8°F | Ht 74.0 in | Wt 234.4 lb

## 2014-12-24 DIAGNOSIS — N281 Cyst of kidney, acquired: Secondary | ICD-10-CM | POA: Diagnosis not present

## 2014-12-24 DIAGNOSIS — Z0000 Encounter for general adult medical examination without abnormal findings: Secondary | ICD-10-CM

## 2014-12-24 DIAGNOSIS — I700 Atherosclerosis of aorta: Secondary | ICD-10-CM | POA: Insufficient documentation

## 2014-12-24 DIAGNOSIS — K760 Fatty (change of) liver, not elsewhere classified: Secondary | ICD-10-CM | POA: Insufficient documentation

## 2014-12-24 DIAGNOSIS — H1132 Conjunctival hemorrhage, left eye: Secondary | ICD-10-CM

## 2014-12-24 DIAGNOSIS — K5730 Diverticulosis of large intestine without perforation or abscess without bleeding: Secondary | ICD-10-CM | POA: Diagnosis not present

## 2014-12-24 DIAGNOSIS — K449 Diaphragmatic hernia without obstruction or gangrene: Secondary | ICD-10-CM | POA: Diagnosis not present

## 2014-12-24 LAB — POCT I-STAT CREATININE: CREATININE: 0.9 mg/dL (ref 0.61–1.24)

## 2014-12-24 MED ORDER — GADOBENATE DIMEGLUMINE 529 MG/ML IV SOLN
20.0000 mL | Freq: Once | INTRAVENOUS | Status: AC | PRN
  Administered 2014-12-24: 20 mL via INTRAVENOUS

## 2014-12-24 MED ORDER — TIMOLOL MALEATE 0.5 % OP SOLN: OPHTHALMIC | Status: DC

## 2014-12-24 NOTE — Assessment & Plan Note (Signed)
Pt presents to clinic due to his wife's insistence for left eye redness x 2-3 days. Redness does not bother patient and per his daughter in the room it has decreased in redness since when it first occurred. Denies changes in vision, eye pain, itching or scratchiness of eyes, and discharge. Unlikely an eye infection or allergies. Sclera is smooth, neg for pinguecula or pterygium. Most likely pt has a subconjunctival hemorrhage.   - pt is asymptomatic w/o changes in vision. Will continue to monitor for improvement. Given handout on subconjunctival hemorrhage.

## 2014-12-24 NOTE — Progress Notes (Signed)
Subjective:   Patient ID: Stephen Hendricks male   DOB: 1940/04/26 74 y.o.   MRN: [id number]  HPI: [name] is a 74 y.o. with past medical history as outlined below who presents to clinic for left eye redness x 2-3 days. He states his left eye redness does not bother him but his wife would like for him to have it looked at. Denies heavy lifting, exercise, coughing, or sneezing. He does not have blurry vision, itching of his eyes, or eye discharge. Denies gritty sensation of eyes. His daughter states his left eye has decreased in redness since it first started.   Please see problem list for status of the pt's chronic medical problems.  Past Medical History  Diagnosis Date  . HTN (hypertension)   . BPH (benign prostatic hyperplasia)   . LBBB (left bundle branch block)   . Frozen shoulder   . Right tennis elbow   . Hx of prostatitis 3.20.16    WITH E.COLI  . Cataract of right eye   . Malaria     [place]  . Leishman[place]     [place]  . Oral candidiasis     [place]  . SOB (shortness of breath)    Current Outpatient Prescriptions  Medication Sig Dispense Refill  . amLODipine (NORVASC) 5 MG tablet Take 1 tablet (5 mg total) by mouth daily. 30 tablet 5  . aspirin 81 MG tablet Take 81 mg by mouth daily.    Marland Kitchen. atorvastatin (LIPITOR) 40 MG tablet Take 1 tablet (40 mg total) by mouth daily. 90 tablet 3  . finasteride (PROSCAR) 5 MG tablet Take 5 mg by mouth daily.    Marland Kitchen. lisinopril (PRINIVIL,ZESTRIL) 20 MG tablet Take 1 tablet (20 mg total) by mouth daily. 30 tablet 2  . sodium chloride (OCEAN) 0.65 % nasal spray Place 1 spray into the nose daily as needed for congestion.    . tamsulosin (FLOMAX) 0.4 MG CAPS capsule TAKE 2 CAPSULES (0.8 MG TOTAL) BY MOUTH DAILY. 60 capsule 2  . traMADol (ULTRAM) 50 MG tablet Take 1 tablet (50 mg total) by mouth every 6 (six) hours as needed. 15 tablet 0   No current facility-administered medications for this visit.   Family  History  Problem Relation Age of Onset  . Other Father     PROSTATE DISEASE  . Cancer Daughter     BREAST  . Heart attack Neg Hx   . Hypertension Neg Hx   . Stroke Neg Hx   . Cardiomyopathy Mother    Social History   Social History  . Marital Status: Married    Spouse Name: N/A  . Number of Children: N/A  . Years of Education: N/A   Social History Main Topics  . Smoking status: Former Smoker    Start date: 01/04/1956    Quit date: 01/04/1991  . Smokeless tobacco: None  . Alcohol Use: 1.8 - 2.4 oz/week    3-4 Standard drinks or equivalent per week     Comment: ALSO DRINKS BEER  . Drug Use: None  . Sexual Activity: Not Asked   Other Topics Concern  . None   Social History Narrative   Review of Systems: Review of Systems  Eyes: Positive for redness (x 2-3 days). Negative for blurred vision, pain and discharge.    Objective:  Physical Exam: Filed Vitals:   12/24/14 1335  BP: 151/77  Pulse: 74  Temp: 97.8 F (36.6 C)  TempSrc: Oral  Height:  (1.88 m)  Weight: 234 lb 6.4 oz (106.323 kg)  SpO2: 100%   Physical Exam  Constitutional: He appears well-developed and well-nourished. No distress.  HENT:  Head: Normocephalic.  Eyes: EOM are normal. Right eye exhibits no discharge. Left eye exhibits no discharge. No scleral icterus.  Medial side of left eye is a faint pink color, no cysts beneath the upper or lower lids, lid margins clean  Neurological: He is alert.   Assessment & Plan:   Please see problem based assessment and plan.

## 2014-12-24 NOTE — Assessment & Plan Note (Signed)
Refusing flu shot and pneumavax today.

## 2014-12-24 NOTE — Patient Instructions (Signed)
Subconjunctival Hemorrhage °Subconjunctival hemorrhage is bleeding that happens between the white part of your eye (sclera) and the clear membrane that covers the outside of your eye (conjunctiva). There are many tiny blood vessels near the surface of your eye. A subconjunctival hemorrhage happens when one or more of these vessels breaks and bleeds, causing a red patch to appear on your eye. This is similar to a bruise. °Depending on the amount of bleeding, the red patch may only cover a small area of your eye or it may cover the entire visible part of the sclera. If a lot of blood collects under the conjunctiva, there may also be swelling. Subconjunctival hemorrhages do not affect your vision or cause pain, but your eye may feel irritated if there is swelling. Subconjunctival hemorrhages usually do not require treatment, and they disappear on their own within two weeks. °CAUSES °This condition may be caused by: °· Mild trauma, such as rubbing your eye too hard. °· Severe trauma or blunt injuries. °· Coughing, sneezing, or vomiting. °· Straining, such as when lifting a heavy object. °· High blood pressure. °· Recent eye surgery. °· A history of diabetes. °· Certain medicines, especially blood thinners (anticoagulants). °· Other conditions, such as eye tumors, bleeding disorders, or blood vessel abnormalities. °Subconjunctival hemorrhages can happen without an obvious cause.  °SYMPTOMS  °Symptoms of this condition include: °· A bright red or dark red patch on the white part of the eye. °¨ The red area may spread out to cover a larger area of the eye before it goes away. °¨ The red area may turn brownish-yellow before it goes away. °· Swelling. °· Mild eye irritation. °DIAGNOSIS °This condition is diagnosed with a physical exam. If your subconjunctival hemorrhage was caused by trauma, your health care provider may refer you to an eye specialist (ophthalmologist) or another specialist to check for other injuries. You  may have other tests, including: °· An eye exam. °· A blood pressure check. °· Blood tests to check for bleeding disorders. °If your subconjunctival hemorrhage was caused by trauma, X-rays or a CT scan may be done to check for other injuries. °TREATMENT °Usually, no treatment is needed. Your health care provider may recommend eye drops or cold compresses to help with discomfort. °HOME CARE INSTRUCTIONS °· Take over-the-counter and prescription medicines only as directed by your health care provider. °· Use eye drops or cold compresses to help with discomfort as directed by your health care provider. °· Avoid activities, things, and environments that may irritate or injure your eye. °· Keep all follow-up visits as told by your health care provider. This is important. °SEEK MEDICAL CARE IF: °· You have pain in your eye. °· The bleeding does not go away within 3 weeks. °· You keep getting new subconjunctival hemorrhages. °SEEK IMMEDIATE MEDICAL CARE IF: °· Your vision changes or you have difficulty seeing. °· You suddenly develop severe sensitivity to light. °· You develop a severe headache, persistent vomiting, confusion, or abnormal tiredness (lethargy). °· Your eye seems to bulge or protrude from your eye socket. °· You develop unexplained bruises on your body. °· You have unexplained bleeding in another area of your body. °  °This information is not intended to replace advice given to you by your health care provider. Make sure you discuss any questions you have with your health care provider. °  °Document Released: 12/20/2004 Document Revised: 09/10/2014 Document Reviewed: 02/26/2014 °Elsevier Interactive Patient Education ©2016 [employer] ° °

## 2014-12-25 NOTE — Progress Notes (Signed)
Case discussed with Dr. Truong at the time of the visit.  We reviewed the resident's history and exam and pertinent patient test results.  I agree with the assessment, diagnosis and plan of care documented in the resident's note. 

## 2015-02-05 ENCOUNTER — Telehealth: Payer: Self-pay | Admitting: Internal Medicine

## 2015-02-05 NOTE — Telephone Encounter (Signed)
Pharmacy calling to check on status of a prior authorization for Candesartan

## 2015-02-06 ENCOUNTER — Telehealth: Payer: Self-pay

## 2015-02-06 NOTE — Telephone Encounter (Signed)
Candesartan had been d/c back in Dec 2/2 insurance failure to pay.  Pt needs trial and failure of an ACE and pt was put on lisinopril.Stephen Spittle Cassady2/3/20172:39 PM

## 2015-03-18 ENCOUNTER — Encounter: Payer: Medicaid Other | Admitting: Internal Medicine

## 2015-03-19 ENCOUNTER — Encounter: Payer: Self-pay | Admitting: Internal Medicine

## 2015-03-19 ENCOUNTER — Ambulatory Visit (INDEPENDENT_AMBULATORY_CARE_PROVIDER_SITE_OTHER): Payer: Medicaid Other | Admitting: Internal Medicine

## 2015-03-19 VITALS — BP 145/82 | HR 74 | Temp 98.2°F | Wt 233.2 lb

## 2015-03-19 DIAGNOSIS — R208 Other disturbances of skin sensation: Secondary | ICD-10-CM | POA: Diagnosis not present

## 2015-03-19 DIAGNOSIS — I10 Essential (primary) hypertension: Secondary | ICD-10-CM

## 2015-03-19 DIAGNOSIS — J3489 Other specified disorders of nose and nasal sinuses: Secondary | ICD-10-CM | POA: Diagnosis not present

## 2015-03-19 DIAGNOSIS — M7501 Adhesive capsulitis of right shoulder: Secondary | ICD-10-CM | POA: Diagnosis not present

## 2015-03-19 DIAGNOSIS — A630 Anogenital (venereal) warts: Secondary | ICD-10-CM

## 2015-03-19 DIAGNOSIS — R200 Anesthesia of skin: Secondary | ICD-10-CM

## 2015-03-19 MED ORDER — LISINOPRIL 40 MG PO TABS: 40.0000 mg | ORAL_TABLET | Freq: Every day | ORAL | Status: DC

## 2015-03-19 MED ORDER — IMIQUIMOD 5 % EX CREA: TOPICAL_CREAM | CUTANEOUS | Status: DC

## 2015-03-19 NOTE — Assessment & Plan Note (Signed)
Has a pedunculated lesion on his mid groin. Non tender. No penile discharge or any other lesion.  Looks like Condylomata acuminata. Will treat with imiquimod. Cautioned his wife about sexual transmission risk.   Checking HIV, RPR, Hep C.

## 2015-03-19 NOTE — Progress Notes (Signed)
   Subjective:    Patient ID: Stephen Hendricks, male    DOB: [birth year], 75 y.o.   MRN: [id number]  HPI   75 yo male with hx of HTN, LBBB, Pulm HTN, BPH, numbness of left foot, dry nose, here with multiple complaints.  Has a wort on his groin. Had this for 2 years. Not bothering him. No STD hx. Sexually active with wife, wife is with him.  Has frozen shoulder from previous sports injury. Has not seen anyone for this in the past. ROM is limited. Wants to help with this.  Continues to have left lateral dorsal foot numbness for few years. Declined RPR and HIV in the past.   Continues to have dry nose.tried saline nasal drops without much help. He pokes his nose and it causes bleeding.   HTN: bp remains high. On lisinopril 20mg  and amlodipine 5mg .     Review of Systems  Constitutional: Negative for fever and chills.  HENT: Negative for congestion and sore throat.   Eyes: Negative for photophobia and visual disturbance.  Respiratory: Negative for cough, shortness of breath and wheezing.   Cardiovascular: Negative for chest pain, palpitations and leg swelling.  Gastrointestinal: Negative for abdominal pain, constipation, blood in stool and abdominal distention.  Endocrine: Negative.   Genitourinary: Negative for dysuria and hematuria.  Musculoskeletal: Negative for back pain and arthralgias.  Skin:       Wart on groin.  Neurological: Positive for numbness. Negative for dizziness, syncope and headaches.  Psychiatric/Behavioral: Negative.        Objective:   Physical Exam  Constitutional: He is oriented to person, place, and time. He appears well-developed and well-nourished. No distress.  HENT:  Head: Normocephalic and atraumatic.  Eyes: EOM are normal. Pupils are equal, round, and reactive to light.  Neck: Normal range of motion. No JVD present.  Cardiovascular: Normal rate and regular rhythm.  Exam reveals no gallop and no friction rub.   No murmur heard. Pulmonary/Chest:  Effort normal and breath sounds normal. No respiratory distress. He has no wheezes.  Abdominal: Soft. Bowel sounds are normal. He exhibits no distension. There is no tenderness.  Genitourinary: Penis normal.     Musculoskeletal: Normal range of motion. He exhibits no edema or tenderness.  Has subjective numbness on left lateral dorsal area on left foot. Normal str and sensation on my exam. No joint deformities, full ROM. No tenderness or pain.   Neurological: He is alert and oriented to person, place, and time.  Skin: He is not diaphoretic.  Psychiatric: He has a normal mood and affect.      Filed Vitals:   03/19/15 1319  BP: 145/82  Pulse: 74  Temp: 98.2 F (36.8 C)        Assessment & Plan:  See problem based a&p.

## 2015-03-19 NOTE — Assessment & Plan Note (Signed)
Filed Vitals:   03/19/15 1319  BP: 145/82  Pulse: 74  Temp: 98.2 F (36.8 C)   BP higher than goal. Will increase lisinopril from 20mg  to 40mg  daily. Will check a BMET today. Continue amlodipine 5 mg daily. F/up in 3 months.

## 2015-03-19 NOTE — Assessment & Plan Note (Signed)
Continues to have problem with dry nose. Nasal saline spray did not help much.  Advised him to apply plenty of vaseline on his nose. This should help her scab formation in the nose from dryness. Asked him not to poke his nose as this is causing epistaxis now. Also asked to use humidifier at home.

## 2015-03-19 NOTE — Assessment & Plan Note (Addendum)
Right frozen shoulder from previous sports injury 2+ years ago. He never seeked medical attention for this in the past.   - will do physical therapy for him as at this point other options are not very helpful. If does not get better may consider corticosteroid injection.

## 2015-03-19 NOTE — Patient Instructions (Signed)
Please take lisinopril 40mg  daily from now.  Referred you for physical therapy.  Please get your nerve conduction test done.  Use vaseline and humidifier for your dry nose.   Genital Warts Genital warts are a common STD (sexually transmitted disease). They may appear as small bumps on the tissues of the genital area or anal area. Sometimes, they can become irritated and cause pain. Genital warts are easily passed to other people through sexual contact. Getting treatment is important because genital warts can lead to other problems. In females, the virus that causes genital warts may increase the risk of cervical cancer. CAUSES Genital warts are caused by a virus that is called human papillomavirus (HPV). HPV is spread by having unprotected sex with an infected person. It can be spread through vaginal, anal, and oral sex. Many people do not know that they are infected. They may be infected for years without problems. However, even if they do not have problems, they can pass the infection to their sexual partners. RISK FACTORS Genital warts are more likely to develop in:  People who have unprotected sex.  People who have multiple sexual partners.  People who become sexually active before they are 74 years of age.  Men who are not circumcised.  Women who have a male sexual partner who is not circumcised.  People who have a weakened body defense system (immune system) due to disease or medicine.  People who smoke. SYMPTOMS Symptoms of genital warts include:  Small growths in the genital area or anal area. These warts often grow in clusters.  Itching and irritation in the genital area or anal area.  Bleeding from the warts.  Painful sexual intercourse. DIAGNOSIS Genital warts can usually be diagnosed from their appearance on the vagina, vulva, penis, perineum, anus, or rectum. Tests may also be done, such as:  Biopsy. A tissue sample is removed so it can be looked at under a  microscope.  Colposcopy. In females, a magnifying tool is used to examine the vagina and cervix. Certain solutions may be used to make the HPV cells change color so they can be seen more easily.  A Pap test in females.  Tests for other STDs. TREATMENT Treatment for genital warts may include:  Applying prescription medicines to the warts. These may be solutions or creams.  Freezing the warts with liquid nitrogen (cryotherapy).  Burning the warts with:  Laser treatment.  An electrified probe (electrocautery).  Injecting a substance (Candida antigen or Trichophyton antigen) into the warts to help the body's immune system to fight off the warts.  Interferon injections.  Surgery to remove the warts. HOME CARE INSTRUCTIONS Medicines  Apply over-the-counter and prescription medicines only as told by your health care provider.  Do not treat genital warts with medicines that are used for treating hand warts.  Talk with your health care provider about using over-the-counter anti-itch creams. General Instructions  Do not touch or scratch the warts.  Do not have sex until your treatment has been completed.  Tell your current and past sexual partners about your condition because they may also need treatment.  Keep all follow-up visits as told by your health care provider. This is important.  After treatment, use condoms during sex to prevent future infections. Other Instructions for Women  Women who have genital warts might need increased screening for cervical cancer. This type of cancer is slow growing and can be cured if it is found early. Chances of developing cervical cancer are increased  with HPV.  If you become pregnant, tell your health care provider that you have had HPV. Your health care provider will monitor you closely during pregnancy to be sure that your baby is safe. PREVENTION Talk with your health care provider about getting the HPV vaccines. These vaccines  prevent some HPV infections and cancers. It is recommended that the vaccine be given to males and females who are 43-18 years of age. It will not work if you already have HPV, and it is not recommended for pregnant women. SEEK MEDICAL CARE IF:  You have redness, swelling, or pain in the area of the treated skin.  You have a fever.  You feel generally ill.  You feel lumps in and around your genital area or anal area.  You have bleeding in your genital area or anal area.  You have pain during sexual intercourse.   This information is not intended to replace advice given to you by your health care provider. Make sure you discuss any questions you have with your health care provider.   Document Released: 12/18/1999 Document Revised: 09/10/2014 Document Reviewed: 03/17/2014 Elsevier Interactive Patient Education [employer]

## 2015-03-19 NOTE — Assessment & Plan Note (Signed)
Continues to have left foot numbness on lateral dorsum area. No pain, just numbness. No back pain. No knee pain.   Unclear etiology. could be a nerve entrapment. Has order pending for nerve conduction test. Asked him to get this done.  - will check HIV, RPR, Hep C.

## 2015-03-20 ENCOUNTER — Other Ambulatory Visit: Payer: Self-pay | Admitting: Internal Medicine

## 2015-03-20 LAB — BASIC METABOLIC PANEL
BUN / CREAT RATIO: 16 (ref 10–22)
BUN: 15 mg/dL (ref 8–27)
CO2: 22 mmol/L (ref 18–29)
CREATININE: 0.95 mg/dL (ref 0.76–1.27)
Calcium: 9.4 mg/dL (ref 8.6–10.2)
Chloride: 98 mmol/L (ref 96–106)
GFR, EST AFRICAN AMERICAN: 91 mL/min/{1.73_m2} (ref >59)
GFR, EST NON AFRICAN AMERICAN: 79 mL/min/{1.73_m2} (ref >59)
Glucose: 98 mg/dL (ref 65–99)
POTASSIUM: 4.2 mmol/L (ref 3.5–5.2)
SODIUM: 143 mmol/L (ref 134–144)

## 2015-03-20 LAB — HEPATITIS C ANTIBODY: Hep C Virus Ab: <0.1 s/co ratio (ref 0.0–0.9)

## 2015-03-20 LAB — HIV ANTIBODY (ROUTINE TESTING W REFLEX): HIV Screen 4th Generation wRfx: NONREACTIVE

## 2015-03-20 LAB — RPR: RPR: NONREACTIVE

## 2015-03-24 ENCOUNTER — Telehealth: Payer: Self-pay | Admitting: Internal Medicine

## 2015-03-24 NOTE — Telephone Encounter (Signed)
[phone] FAXED OVER PRIOR AUTH. FOR PATIENT OVER A WEEK, NEEDS CALL BACK ON STATUS

## 2015-03-26 NOTE — Progress Notes (Signed)
[facility] Medicine Clinic Attending  Case discussed with Dr. Ahmed at the time of the visit.  We reviewed the resident's history and exam and pertinent patient test results.  I agree with the assessment, diagnosis, and plan of care documented in the resident's note. 

## 2015-03-26 NOTE — Addendum Note (Signed)
Addended by: Debe CoderMULLEN, EMILY B on: 03/26/2015 02:55 PM   Modules accepted: Level of Service

## 2015-03-27 NOTE — Telephone Encounter (Signed)
Medication changed to the brand name Aldara, pharmacy aware, no PA required.[name], Stephen Reuter Cassady3/24/20174:34 PM

## 2015-03-30 ENCOUNTER — Ambulatory Visit: Payer: Medicaid Other | Attending: Internal Medicine | Admitting: Physical Therapy

## 2015-03-30 DIAGNOSIS — M6281 Muscle weakness (generalized): Secondary | ICD-10-CM

## 2015-03-30 DIAGNOSIS — M25611 Stiffness of right shoulder, not elsewhere classified: Secondary | ICD-10-CM | POA: Diagnosis present

## 2015-03-30 DIAGNOSIS — M25511 Pain in right shoulder: Secondary | ICD-10-CM | POA: Diagnosis present

## 2015-03-30 DIAGNOSIS — R29898 Other symptoms and signs involving the musculoskeletal system: Secondary | ICD-10-CM | POA: Insufficient documentation

## 2015-03-30 DIAGNOSIS — R293 Abnormal posture: Secondary | ICD-10-CM

## 2015-03-30 NOTE — Therapy (Signed)
[facility] Outpatient Rehabilitation [facility] [address] [place], [place], [address] Phone: [phone]   Fax:  [phone]  Physical Therapy Evaluation  Patient Details  Name: Stephen Hendricks MRN: [id number] Date of Birth: 1940/03/01 Referring Provider: Debe Coder MD  Encounter Date: 03/30/2015      PT End of Session - 03/30/15 1447    Visit Number 1   Number of Visits 1   Date for PT Re-Evaluation 03/31/15   Authorization Type Medicaid   PT Start Time 1330   PT Stop Time 1420   PT Time Calculation (min) 50 min   Activity Tolerance Patient tolerated treatment well   Behavior During Therapy [facility] for tasks assessed/performed      Past Medical History  Diagnosis Date  . HTN (hypertension)   . BPH (benign prostatic hyperplasia)   . LBBB (left bundle branch block)   . Frozen shoulder   . Right tennis elbow   . Hx of prostatitis 3.20.16    WITH E.COLI  . Cataract of right eye   . [place]     [place]  . [place]     [place]  . Oral candidiasis     [place]  . SOB (shortness of breath)     Past Surgical History  Procedure Laterality Date  . 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 Treatment fistula anal  1980    pt really couldn't remember    There were no vitals filed for this visit.  Visit Diagnosis:  Pain in right shoulder - Plan: PT plan of care cert/re-cert  Stiffness of right shoulder, not elsewhere classified - Plan: PT plan of care cert/re-cert  Localized muscle weakness - Plan: PT plan of care cert/re-cert  Abnormal posture - Plan: PT plan of care cert/re-cert      Subjective Assessment - 03/30/15 1336    Subjective pt is a 75 y.o M with CC of R shoulder pain that has been going on for about 5 years that started non-tramadically and gradually worsened. He reports intermittent pain but reports biggest issues is weakness and stiffness in the shoulder. Pt denies any N/T in the arm.    Limitations  Lifting;Standing;Walking;House hold activities   How long can you sit comfortably? unlimited   How long can you stand comfortably? unlimited   How long can you walk comfortably? unlimited   Diagnostic tests 2 years ago x-ray    Patient Stated Goals to get strength and ROM back.    Currently in Pain? Yes   Pain Score 2    Pain Location Shoulder   Pain Orientation Right   Pain Descriptors / Indicators Sore;Tightness  stiffness   Pain Type Chronic pain   Pain Onset More than a month ago   Pain Frequency Constant   Aggravating Factors  moving around,    Pain Relieving Factors stop moving            [facility] PT Assessment - 03/30/15 1331    Assessment   Medical Diagnosis R Frozen Shoulder   Referring Provider Debe Coder MD   Onset Date/Surgical Date --  within the last 5 years   Hand Dominance Right   Next MD Visit PRN   Prior Therapy YES   Precautions   Precautions None   Restrictions   Weight Bearing Restrictions No   Balance Screen   Has the patient fallen in the past 6 months No   Has the patient had a decrease in activity  level because of a fear of falling?  No   Is the patient reluctant to leave their home because of a fear of falling?  No   Home Tourist information centre manager residence   Living Arrangements Spouse/significant other;Other relatives   Available Help at Discharge Available PRN/intermittently;Available 24 hours/day   Type of Home House   Home Access Stairs to enter   Entrance Stairs-Number of Steps 4   Entrance Stairs-Rails Right   Home Layout Two level   Alternate Level Stairs-Number of Steps 15   Prior Function   Level of Independence Independent;Independent with basic ADLs   Vocation Retired   Leisure hanging out with family   Cognition   Overall Cognitive Status Within Functional Limits for tasks assessed   Posture/Postural Control   Posture/Postural Control Postural limitations   Postural Limitations Rounded Shoulders;Forward  head;Increased thoracic kyphosis   ROM / Strength   AROM / PROM / Strength AROM;PROM;Strength   AROM   AROM Assessment Site Shoulder   Right/Left Shoulder Right;Left   Right Shoulder Extension 50 Degrees   Right Shoulder Flexion 60 Degrees  ERP   Right Shoulder ABduction 75 Degrees  ERP   Right Shoulder Internal Rotation 45 Degrees   Right Shoulder External Rotation 14 Degrees   Left Shoulder Extension 72 Degrees   Left Shoulder Flexion 145 Degrees   Left Shoulder ABduction 113 Degrees  soreness at endrange   Left Shoulder Internal Rotation 75 Degrees   Left Shoulder External Rotation 65 Degrees   PROM   Overall PROM Comments no abnormal end range feels, all range of motion was stopped by patient secondary to pain   PROM Assessment Site Shoulder   Right/Left Shoulder Right;Left   Right Shoulder Flexion 120 Degrees   Right Shoulder ABduction 88 Degrees   Right Shoulder Internal Rotation 82 Degrees   Right Shoulder External Rotation 50 Degrees   Strength   Strength Assessment Site Shoulder;Hand   Right/Left Shoulder Right;Left   Right Shoulder Flexion 2+/5  based on ROM   Right Shoulder Extension 4-/5   Right Shoulder ABduction 2+/5  based of ROM   Right Shoulder Internal Rotation 4-/5   Right Shoulder External Rotation 2+/5  pain during testing   Left Shoulder Flexion 3/5  Based on ROM   Left Shoulder Extension 3/5  Based on ROM   Left Shoulder ABduction 3/5  Based on ROM   Left Shoulder Internal Rotation 3/5  Based on ROM   Left Shoulder External Rotation 3/5  Based on ROM   Right Hand Grip (lbs) 95.3  94,96,96   Left Hand Grip (lbs) 92.3  95,89,93   Palpation   Palpation comment pain along the supraspinatus with referral to the greater tubercle, especially noted during open/ empty can testing   Special Tests    Special Tests Rotator Cuff Impingement   Rotator Cuff Impingment tests Neer impingement test;Hawkins- Kyung Rudd test;Empty Can test;Full Can test   Neer  Impingement test    Findings Unable to tesr   Hawkins-Kennedy test   Findings Positive   Empty Can test   Findings Positive   Side Right   Full Can test   Findings Positive   Side Right   Ambulation/Gait   Gait Pattern Within Functional Limits                           PT Education - 03/30/15 1445    Education provided Yes  Education Details evaluation findings, HEP, HOPE clinic handout, benefits of further imaging.   Person(s) Educated Patient   Methods Explanation   Comprehension Verbalized understanding                    Plan - 03/30/15 1510    Clinical Impression Statement Raydin present to OPPT as moderate complexity evaluation with CC or R shoulder stiffness and intermittent pain. He demonstrated limited sholder AROM bil with R> L with ERP with R shoulder flexion and abduction actively and passively. Passive motion was limited secondary to pain, with no abnormal end feel. pt demonstrated weakness  in bil shoulders with R >L with pain during testing with R external rotation with singificant weakness. Special testiing was positive with hawkens kennedy and open/ empty can testing with pain in the supraspinatus that radiated to the greater tubercle possibly indicating rotator cuff  involvment and may benefit from diagnastic imaging for further assessment. Educated pt on home exercises to improve moblility and being working on strengthneing of the shoulder. Also due to pt being Medicaid provided [facility].    PT Frequency --  1 visit only   PT Next Visit Plan Medicaid 1 time evaluation   PT Home Exercise Plan wand flexion/ abduction/ external rotation, seated posture, chin tucks, Rows, shoulder Internal/ external rotation   Consulted and Agree with Plan of Care Patient;Family member/caregiver   Family Member Consulted wife         Problem List Patient Active Problem List   Diagnosis Date Noted  . Genital warts  03/19/2015  . Subconjunctival hemorrhage of left eye 12/24/2014  . Pain in right testicle 10/29/2014  . Pulmonary hypertension (HCC) 10/23/2014  . Dyspnea 10/03/2014  . Hyperlipidemia 10/03/2014  . LBBB (left bundle branch block)   . Frozen shoulder   . Right tennis elbow   . Nasal dryness 09/29/2014  . Thoracic back pain 09/29/2014  . Numbness of left foot 09/29/2014  . Essential hypertension 09/11/2014  . BPH (benign prostatic hyperplasia) 09/11/2014  . Left bundle branch block (LBBB) 09/11/2014  . Healthcare maintenance 09/11/2014   Lulu RidingKristoffer Mattia Osterman PT, DPT, LAT, ATC  03/30/2015  5:16 PM      [facility] [address] [place], [place], [address] Phone: [phone]   Fax:  [phone]  Name: Dorcas Mcmurraysim Topping MRN: [id number] Date of Birth: 11-Sep-1940

## 2015-04-16 ENCOUNTER — Other Ambulatory Visit: Payer: Self-pay | Admitting: Internal Medicine

## 2015-04-24 ENCOUNTER — Encounter: Payer: Self-pay | Admitting: Internal Medicine

## 2015-04-24 ENCOUNTER — Ambulatory Visit (INDEPENDENT_AMBULATORY_CARE_PROVIDER_SITE_OTHER): Payer: Medicaid Other | Admitting: Internal Medicine

## 2015-04-24 VITALS — BP 94/65 | HR 87 | Temp 98.0°F | Resp 20 | Ht 71.5 in | Wt 225.6 lb

## 2015-04-24 DIAGNOSIS — A630 Anogenital (venereal) warts: Secondary | ICD-10-CM

## 2015-04-24 DIAGNOSIS — N400 Enlarged prostate without lower urinary tract symptoms: Secondary | ICD-10-CM | POA: Diagnosis not present

## 2015-04-24 DIAGNOSIS — I10 Essential (primary) hypertension: Secondary | ICD-10-CM

## 2015-04-24 MED ORDER — TAMSULOSIN HCL 0.4 MG PO CAPS: 0.4000 mg | ORAL_CAPSULE | Freq: Every day | ORAL | Status: DC

## 2015-04-24 NOTE — Assessment & Plan Note (Signed)
Pt is orthostatic today with BP 143/75 sitting, 94/65 standing.  His dose of tamsulosin was doubled and is also taking finasteride.  He is also drinking 4-5 glasses of scotch/day.  Likely dizziness and orthostasis due to combination of alcohol, tamsulosin, and finasteride.  -decrease dose of tamsulosin to one tab daily -decrease alcohol use -return in 2-3 weeks to recheck BP and orthostasis

## 2015-04-24 NOTE — Assessment & Plan Note (Addendum)
Pt has been using imiquimod for warts but reports the warts have spread.  He also report the skin is sloughing off.  He has quit using the medication because he feels like it is causing additional warts. -he has d/c the imiquimod -will need to examine at next OV -advised him to have urologist examine this on Monday 4/24 at his appointment

## 2015-04-24 NOTE — Patient Instructions (Signed)
Thank you for your visit today.   Please return to the internal medicine clinic in about 3 weeks.       I have made the following additions/changes to your medications:  I would decrease the tamsulosin to 1 tablet daily. I would continue your other medications. Please keep your follow up appointment with urology. Please cut down on your drinking as this will also cause you to feel dizzy when you stand.   Please be sure to bring all of your medications with you to every visit; this includes herbal supplements, vitamins, eye drops, and any over-the-counter medications.   Should you have any questions regarding your medications and/or any new or worsening symptoms, please be sure to call the clinic at [phone].   If you believe that you are suffering from a life threatening condition or one that may result in the loss of limb or function, then you should call 911 and proceed to the nearest Emergency Department.   A healthy lifestyle and preventative care can promote health and wellness.   Maintain regular health, dental, and eye exams.  Eat a healthy diet. Foods like vegetables, fruits, whole grains, low-fat dairy products, and lean protein foods contain the nutrients you need without too many calories. Decrease your intake of foods high in solid fats, added sugars, and salt. Get information about a proper diet from your caregiver, if necessary.  Regular physical exercise is one of the most important things you can do for your health. Most adults should get at least 150 minutes of moderate-intensity exercise (any activity that increases your heart rate and causes you to sweat) each week. In addition, most adults need muscle-strengthening exercises on 2 or more days a week.   Maintain a healthy weight. The body mass index (BMI) is a screening tool to identify possible weight problems. It provides an estimate of body fat based on height and weight. Your caregiver can help determine your  BMI, and can help you achieve or maintain a healthy weight. For adults 20 years and older:  A BMI below 18.5 is considered underweight.  A BMI of 18.5 to 24.9 is normal.  A BMI of 25 to 29.9 is considered overweight.  A BMI of 30 and above is considered obese.

## 2015-04-24 NOTE — Progress Notes (Signed)
Medicine attending: Medical history, presenting problems, physical findings, and medications, reviewed with resident physician Dr Jacqueline Gill  on the day of the patient visit and I concur with her evaluation and management plan. 

## 2015-04-24 NOTE — Progress Notes (Signed)
Patient ID: Stephen Hendricks, male   DOB: 1940/01/16, 75 y.o.   MRN: [id number]     Subjective:   Patient ID: Stephen Hendricks male    DOB: 1940/01/16 75 y.o.    MRN: [id number] Health Maintenance Due: Health Maintenance Due  Topic Date Due  . TETANUS/TDAP  10/12/1960  . COLONOSCOPY  10/13/1991  . ZOSTAVAX  10/12/2001  . PNA vac Low Risk Adult (1 of 2 - PCV13) 10/13/2006    _________________________________________________  HPI: [name] is a 75 y.o. male here for a follow up visit.  Pt has a PMH outlined below.  Please see problem-based charting assessment and plan for further status of patient's chronic medical problems addressed at today's visit.  PMH: Past Medical History  Diagnosis Date  . HTN (hypertension)   . BPH (benign prostatic hyperplasia)   . LBBB (left bundle branch block)   . Frozen shoulder   . Right tennis elbow   . Hx of prostatitis 3.20.16    WITH E.COLI  . Cataract of right eye   . [place]     [place]  . [place]     [place]  . Oral candidiasis     [place]  . SOB (shortness of breath)     Medications: Current Outpatient Prescriptions on File Prior to Visit  Medication Sig Dispense Refill  . amLODipine (NORVASC) 5 MG tablet TAKE 1 TABLET BY MOUTH DAILY 30 tablet 5  . aspirin 81 MG tablet Take 81 mg by mouth daily.    Marland Kitchen. atorvastatin (LIPITOR) 40 MG tablet Take 1 tablet (40 mg total) by mouth daily. (Patient not taking: Reported on 03/30/2015) 90 tablet 3  . finasteride (PROSCAR) 5 MG tablet Take 5 mg by mouth daily.    Marland Kitchen. lisinopril (PRINIVIL,ZESTRIL) 40 MG tablet Take 1 tablet (40 mg total) by mouth daily. 30 tablet 3  . sodium chloride (OCEAN) 0.65 % nasal spray Place 1 spray into the nose daily as needed for congestion.    . traMADol (ULTRAM) 50 MG tablet Take 1 tablet (50 mg total) by mouth every 6 (six) hours as needed. (Patient not taking: Reported on 03/30/2015) 15 tablet 0   No current facility-administered  medications on file prior to visit.    Allergies: No Known Allergies  FH: Family History  Problem Relation Age of Onset  . Other Father     PROSTATE DISEASE  . Cancer Daughter     BREAST  . Heart attack Neg Hx   . Hypertension Neg Hx   . Stroke Neg Hx   . Cardiomyopathy Mother     SH: Social History   Social History  . Marital Status: Married    Spouse Name: N/A  . Number of Children: N/A  . Years of Education: N/A   Social History Main Topics  . Smoking status: Former Smoker    Start date: 01/04/1956    Quit date: 01/04/1991  . Smokeless tobacco: None  . Alcohol Use: 1.8 - 2.4 oz/week    3-4 Standard drinks or equivalent per week     Comment: ALSO DRINKS BEER  . Drug Use: None  . Sexual Activity: Not Asked   Other Topics Concern  . None   Social History Narrative    Review of Systems: Constitutional: Negative for fever, chills.  Eyes: Negative for blurred vision.  Respiratory: Negative for cough and shortness of breath.  Cardiovascular: Negative for chest pain.  Gastrointestinal: Negative for nausea, vomiting. Neurological: +dizziness.  Objective:   Vital Signs: Filed Vitals:   04/24/15 1127 04/24/15 1145 04/24/15 1149  BP: 140/79 143/75 94/65  Pulse: 79 85 87  Temp: 98 F (36.7 C)    TempSrc: Oral    Resp: 20    Height: 5' 11.5" (1.816 m)    Weight: 225 lb 9.6 oz (102.331 kg)    SpO2: 100% 98% 97%     BP Readings from Last 3 Encounters:  04/24/15 94/65  03/19/15 145/82  12/24/14 151/77    Physical Exam: Constitutional: Vital signs reviewed.  Patient is in NAD and cooperative with exam.  Head: Normocephalic and atraumatic. Eyes: EOMI, conjunctivae nl, no scleral icterus.  Neck: Supple. Cardiovascular: RRR. Pulmonary/Chest: Normal effort, CTAB, no wheezes, rales, or rhonchi. Abdominal: Soft. NT/ND +BS. Neurological: A&O x3, cranial nerves II-XII are grossly intact, moving all extremities.   Assessment & Plan:   Assessment and  plan was discussed and formulated with my attending.

## 2015-04-24 NOTE — Assessment & Plan Note (Signed)
Pt reports dizziness and is orthostatic.  He is taking 2 tabs of tamsulosin. -decrease tamsulosin to 1 tab daily

## 2015-06-11 ENCOUNTER — Ambulatory Visit: Payer: Medicaid Other | Admitting: Internal Medicine

## 2015-06-12 ENCOUNTER — Ambulatory Visit (INDEPENDENT_AMBULATORY_CARE_PROVIDER_SITE_OTHER): Payer: Medicaid Other | Admitting: Internal Medicine

## 2015-06-12 ENCOUNTER — Encounter: Payer: Self-pay | Admitting: Internal Medicine

## 2015-06-12 ENCOUNTER — Ambulatory Visit (HOSPITAL_COMMUNITY)
Admission: RE | Admit: 2015-06-12 | Discharge: 2015-06-12 | Disposition: A | Discharge status: Home / Self Care | Payer: Medicaid Other | Source: Ambulatory Visit | Attending: Student in an Organized Health Care Education/Training Program | Admitting: Student in an Organized Health Care Education/Training Program

## 2015-06-12 VITALS — BP 135/71 | HR 97 | Temp 97.9°F | Resp 20

## 2015-06-12 DIAGNOSIS — J984 Other disorders of lung: Secondary | ICD-10-CM | POA: Diagnosis not present

## 2015-06-12 DIAGNOSIS — R05 Cough: Secondary | ICD-10-CM | POA: Diagnosis present

## 2015-06-12 DIAGNOSIS — J90 Pleural effusion, not elsewhere classified: Secondary | ICD-10-CM | POA: Diagnosis not present

## 2015-06-12 DIAGNOSIS — R059 Cough, unspecified: Secondary | ICD-10-CM

## 2015-06-12 DIAGNOSIS — J189 Pneumonia, unspecified organism: Secondary | ICD-10-CM

## 2015-06-12 DIAGNOSIS — R918 Other nonspecific abnormal finding of lung field: Secondary | ICD-10-CM

## 2015-06-12 MED ORDER — AZITHROMYCIN 250 MG PO TABS: ORAL_TABLET | ORAL | Status: AC

## 2015-06-12 MED ORDER — FAMOTIDINE 20 MG PO TABS: 20.0000 mg | ORAL_TABLET | Freq: Two times a day (BID) | ORAL | Status: DC

## 2015-06-12 MED ORDER — IBUPROFEN 400 MG PO TABS: 400.0000 mg | ORAL_TABLET | Freq: Four times a day (QID) | ORAL | Status: DC | PRN

## 2015-06-12 NOTE — Patient Instructions (Signed)
[name],  Your likely having a bronchitis or pneumonia, most likely caused by a respiratory virus. We will get a chest x-ray now to see if there is any sign of any pneumonia. I will call you with the results this afternoon. If there is, I will call in an antibiotic for you to take for the next 5 days, called azithromycin. If there isn't, I want you to continue taking acetaminophen, as well as ibuprofen 400 mg every 6 hours. You can also keep using topical pain cream for your back pain. This should improve over the next several days. However, if you start to feel worse overall, with worsening fevers, worsening cough, worsening shortness of breath or other symptoms, I want you to call the clinic so we can reevaluate you. While I do not think the lisinopril is causing your cough since this sounds more like an infection, I agree with your son to hold the medication until we see you next. We will make an appointment to see you in 2 weeks to see if you are feeling better. Of course, if you are feeling worse in the meantime we will see sooner.  I hope you start feeling better soon! Reubin MilanBilly Kylani Wires

## 2015-06-15 DIAGNOSIS — J189 Pneumonia, unspecified organism: Secondary | ICD-10-CM | POA: Insufficient documentation

## 2015-06-15 NOTE — Assessment & Plan Note (Signed)
For the past 2 weeks, patient has had central chest congestion with initial dry cough, now productive of yellowish sputum for the past 2 days. Also with generalized fatigue and malaise for the past 2 weeks as well as nausea. He has had 2 separate episodes of NBNB vomiting after coughing fits. He has also noted subjective fevers for the past 2 days. He denies any other symptoms, including night sweats, hemoptysis, worsening SOB, diffuse myalgias, sore throat, rhinorrhea, rash or any diarrhea. He denies sick contacts as well. His son is a cardiologist, who recommended he hold the lisinopril. However, the cough has persisted despite this and is now productive, so I doubt has anything to do with his ACE inhibitor. This is most likely either an acute viral bronchitis or community-acquired pneumonia. -CXR today shows right middle lobe consolidation consistent with community-acquired pneumonia. Informed patient of the results and antibiotic treatment plan as well as other plan outlined below -Start azithromycin for 5 day course -Continued acetaminophen, start scheduled ibuprofen 400 mg 4-6 times per day -Pepcid as needed for post vomiting reflux -Instructed patient to contact the clinic if he begins to feel worse so we can reevaluate him -RTC in 2 weeks regardless for CAP recheck

## 2015-06-15 NOTE — Progress Notes (Signed)
Internal Medicine Clinic Attending  Case discussed with Dr. Kennedy at the time of the visit.  We reviewed the resident's history and exam and pertinent patient test results.  I agree with the assessment, diagnosis, and plan of care documented in the resident's note.  

## 2015-06-15 NOTE — Progress Notes (Signed)
   Patient ID: Stephen Hendricks male   DOB: 05/18/1940 75 y.o.   MRN: [id number]  Subjective:   HPI: [name] is a 75 y.o. former smoker with PMH of HTN and BPH who [facility] for evaluation of respiratory illness.   Please see problem-based charting for status of medical issues pertinent to this visit.  Review of Systems: Pertinent items noted in HPI and remainder of comprehensive ROS otherwise negative.  Objective:  Physical Exam: Filed Vitals:   06/12/15 1445  BP: 135/71  Pulse: 97  Temp: 97.9 F (36.6 C)  TempSrc: Oral  Resp: 20  SpO2: 94%   Gen: Tired-appearing, alert and oriented to person, place, and time HEENT: Oropharynx clear without erythema or exudate.  Neck: No cervical LAD, no thyromegaly or nodules, no JVD noted. CV: Normal rate, regular rhythm, no murmurs, rubs, or gallops Pulmonary: Normal effort, coarse crackles over the lower right lung fields. Slight dullness to percussion over the same area, resonant otherwise. Normal breath sounds auscultated in other fields. No wheezes heard. Abdominal: Soft, non-tender, non-distended, without rebound, guarding, or masses Extremities: Distal pulses 2+ in upper and lower extremities bilaterally, no tenderness, erythema or edema Skin: No atypical appearing moles. No rashes  Assessment & Plan:  Please see problem-based charting for assessment and plan.  Reubin MilanBilly Shamarra Warda, MD Resident Physician, PGY-1 Department of Internal Medicine [facility]

## 2015-06-25 ENCOUNTER — Other Ambulatory Visit: Payer: Self-pay | Admitting: Internal Medicine

## 2015-06-25 ENCOUNTER — Ambulatory Visit (INDEPENDENT_AMBULATORY_CARE_PROVIDER_SITE_OTHER): Payer: Medicaid Other | Admitting: Internal Medicine

## 2015-06-25 ENCOUNTER — Encounter: Payer: Self-pay | Admitting: Internal Medicine

## 2015-06-25 ENCOUNTER — Encounter: Payer: Self-pay | Admitting: Licensed Clinical Social Worker

## 2015-06-25 VITALS — BP 155/74 | HR 90 | Temp 98.4°F | Ht 71.5 in | Wt 221.1 lb

## 2015-06-25 DIAGNOSIS — Z8701 Personal history of pneumonia (recurrent): Secondary | ICD-10-CM

## 2015-06-25 DIAGNOSIS — Z09 Encounter for follow-up examination after completed treatment for conditions other than malignant neoplasm: Secondary | ICD-10-CM | POA: Diagnosis present

## 2015-06-25 DIAGNOSIS — A689 Relapsing fever, unspecified: Secondary | ICD-10-CM

## 2015-06-25 DIAGNOSIS — R6889 Other general symptoms and signs: Secondary | ICD-10-CM

## 2015-06-25 DIAGNOSIS — J189 Pneumonia, unspecified organism: Secondary | ICD-10-CM

## 2015-06-25 NOTE — Patient Instructions (Signed)
General Instructions: - Your pneumonia has resolved based on your symptoms - I will have you come back in 1 month to see how your blood pressure is doing and to follow up on your fatigue/fevers. We may need to restart the Lisinopril.  Please bring your medicines with you each time you come to clinic.  Medicines may include prescription medications, over-the-counter medications, herbal remedies, eye drops, vitamins, or other pills.   Progress Toward Treatment Goals:  No flowsheet data found.  Self Care Goals & Plans:  Self Care Goal 06/25/2015  Manage my medications take my medicines as prescribed; bring my medications to every visit; refill my medications on time  Monitor my health keep track of my blood pressure  Eat healthy foods eat more vegetables; eat foods that are low in salt; eat baked foods instead of fried foods  Be physically active find an activity I enjoy    No flowsheet data found.   Care Management & Community Referrals:  No flowsheet data found.

## 2015-06-29 DIAGNOSIS — A689 Relapsing fever, unspecified: Secondary | ICD-10-CM | POA: Insufficient documentation

## 2015-06-29 NOTE — Assessment & Plan Note (Signed)
His symptoms for pneumonia have resolved completely and he completed the 5 day azithromycin course. He reports his breathing is back to baseline. Advised him to monitor for his "recurrent fevers" (see separate note).

## 2015-06-29 NOTE — Progress Notes (Signed)
Mr. Ryser and spouse requesting to meet with CSW to discuss healthcare insurance options.  CSW met with pt and spouse.  Mr. Passero is current active with [place] Medicaid until 08/03/15, after which he will be ineligible.  Spouse is uninsured with the [employer].  Pt and spouse have relocated to [place] from the [place] to become the primary caregiver to their grandchildren after the death of their daughter.  They are currently unemployed but receive financial assistance from their son.  Pt and spouse have worked with the [facility] [employer] in the past regarding their Visa status, but unaware of the [employer].  CSW provided very brief information on the differences and eligibility on Medicare/Medicaid.  Spouse states she became overwhelmed with calls when she placed information in the system for marketplace insurance.  Pt and spouse unaware of healthcare insurance terminology, CSW provided information from healthcare.gov and inquired about referral to [facility].  Pt and spouse agreeable, referral to [facility] completed.

## 2015-06-29 NOTE — Progress Notes (Signed)
   Subjective:    Patient ID: Stephen Hendricks, male    DOB: 24-May-1940, 75 y.o.   MRN: [id number]  HPI [name] is a 74yo man with PMHx of HTN, hyperlipidemia, and BPH who presents today for follow up of his CAP.  He was seen in clinic on 6/12 for chest congestion, increased sputum production, fatigue, malaise, and nausea found to have a RML pneumonia on CXR. He was prescribed a 5 day course of Azithromycin which he completed. He reports he is feeling much better today. He notes a few days ago he was feeling "feverish" but did not take his temperature. He also notes a dry cough, occasional pain when he coughs, and feeling fatigued. He denies SOB and productive cough.   His wife who accompanied him to the visit reports he has been feeling fatigued for almost 9 months now. She notes he is eating less and having "recurrent fevers." When I asked what his temperatures have been running they were unable to tell me as they do not have a thermometer at home. Patient states he feels fine now and that occasionally he will feel like he is having a fever. He reports his appetite is fine. He denies joint pains (other than his chronic shoulder, knee, and back pain), new lumps or bumps, chest pain, abdominal pain, rashes, night sweats, changes in weight, melena, hematochezia.    Review of Systems General: See HPI HEENT: Denies headaches, ear pain, changes in vision, rhinorrhea, sore throat CV: Denies palpitations, orthopnea Pulm: Denies wheezing GI: Denies nausea, vomiting, diarrhea, constipation GU: Denies dysuria, hematuria, frequency Msk: Denies muscle cramps Neuro: Denies weakness, numbness, tingling Skin: Denies bruising Psych: Denies depression, anxiety, hallucinations    Objective:   Physical Exam General: alert, sitting up, pleasant, NAD HEENT: [place]/AT, EOMI, sclera anicteric, pharynx non-erythematous, mucus membranes moist Neck: no lymphadenopathy CV: RRR, no m/g/r Pulm: CTA bilaterally,  breaths non-labored Abd: BS+, soft, non-tender Ext: warm, no peripheral edema. He has full ROM of all joints with passive movement, no pain elicited. He lifts his arms to 90 degrees with active ROM due to fear of pain. No DIP, PIP, MCP swelling or erythema. No axillary lymphadenopathy. Neuro: alert and oriented x 3. Strength 5/5 in upper and lower extremities bilaterally.     Assessment & Plan:  Please refer to A&P documentation.

## 2015-06-29 NOTE — Assessment & Plan Note (Signed)
His wife was adamant that the patient has been having recurrent fevers for the last 9 months or so. I did not appreciate any abnormalities on exam that would lead to a source of infection or malignancy. No lymphadenopathy on exam. Weight is stable. Advised for the patient and his wife to buy a thermometer they can keep at home and for him to check his temperature if he is feeling feverish. I recommended to keep a log of any temperatures they take as well as write down what he is feeling at that time. He has no other symptoms other than fatigue, which is very nonspecific. No joint pains, rashes, myalgias to suggest an autoimmune/inflammatory process such as PMR. Will have him follow up in 1 month with his PCP to reassess.

## 2015-06-30 NOTE — Progress Notes (Signed)
Internal Medicine Clinic Attending  Case discussed with Dr. Rivet at the time of the visit.  We reviewed the resident's history and exam and pertinent patient test results.  I agree with the assessment, diagnosis, and plan of care documented in the resident's note.  

## 2015-07-06 ENCOUNTER — Encounter: Payer: Self-pay | Admitting: *Deleted

## 2015-07-12 ENCOUNTER — Other Ambulatory Visit: Payer: Self-pay | Admitting: Internal Medicine

## 2015-07-29 ENCOUNTER — Encounter: Payer: Medicaid Other | Admitting: Internal Medicine

## 2015-08-05 ENCOUNTER — Encounter: Payer: Self-pay | Admitting: Internal Medicine

## 2015-08-05 ENCOUNTER — Ambulatory Visit (INDEPENDENT_AMBULATORY_CARE_PROVIDER_SITE_OTHER): Payer: Medicaid Other | Admitting: Internal Medicine

## 2015-08-05 VITALS — BP 153/74 | HR 76 | Temp 98.1°F | Ht 74.0 in | Wt 225.8 lb

## 2015-08-05 DIAGNOSIS — I10 Essential (primary) hypertension: Secondary | ICD-10-CM | POA: Diagnosis present

## 2015-08-05 DIAGNOSIS — N400 Enlarged prostate without lower urinary tract symptoms: Secondary | ICD-10-CM | POA: Diagnosis not present

## 2015-08-05 DIAGNOSIS — Z79899 Other long term (current) drug therapy: Secondary | ICD-10-CM | POA: Diagnosis not present

## 2015-08-05 DIAGNOSIS — Z0000 Encounter for general adult medical examination without abnormal findings: Secondary | ICD-10-CM

## 2015-08-05 DIAGNOSIS — H269 Unspecified cataract: Secondary | ICD-10-CM | POA: Diagnosis not present

## 2015-08-05 DIAGNOSIS — J3489 Other specified disorders of nose and nasal sinuses: Secondary | ICD-10-CM

## 2015-08-05 MED ORDER — LISINOPRIL 10 MG PO TABS: 10.0000 mg | ORAL_TABLET | Freq: Every day | ORAL | 11 refills | Status: DC

## 2015-08-05 MED ORDER — TAMSULOSIN HCL 0.4 MG PO CAPS
0.8000 mg | ORAL_CAPSULE | Freq: Every day | ORAL | 6 refills | Status: DC
Start: 2015-08-05 — End: 2015-10-14

## 2015-08-05 NOTE — Assessment & Plan Note (Signed)
Chronic left nasal septum scabbing present since last visit with me in September 2016. He has continued to try ocean nasal spray, vasaline, and humidifier. His scab increases, blocking his nasal passage, which he picks at to find relief with result of further bleeding and rescabbing. This cycle repeats itself over and over. No septal perforation I can see on exam. Right nasal passage appears normal. Renal function is good, suggesting vasculitis as a less likely cause for this issue. -Continue nasal saline, vaseline, humidifier -Avoid picking at scab -Refer to ENT for this chronic issue and further evaluation

## 2015-08-05 NOTE — Progress Notes (Signed)
CC: Double vision and dry nose  HPI:  Stephen Hendricks is a 74 y.o. male with PMH as listed below who presents for follow up management of his HTN, BPH, and with complaint of double vision and nasal dryness. Please see problem based charting for status of patient's chronic medical issues.  Patient reports a history of cataract surgery in his right eye in [place] in 2015. He reports he has cataracts in the left eye and was planning on surgery at that time, but did not have this done since he moved to the [place]. He reports he is now experiencing double vision for the last few months that is constant and while wearing his glasses. He has not established with an eye doctor here yet.  Patient with complaint of continued nasal dryness which has been occurring for over a year. He has been using ocean nasal spray, vaseline, and a humidifier without relief. He has a dry scab on his left nasal septum which was seen on visit in September 2016.  Patient reports that he is taking Amlodipine 5 mg daily for his HTN. He was on Lisinopril 20 mg which was increased to 40 mg in March. One month after he started experiencing orthostatic hypotension and discontinued the Lisinopril. This has improved off the Lisinopril. BP is slightly elevated at 153/74 on arrival.  Patient requesting refill on Tamsulosin for his BPH. This was decreased from 0.8 mg to 0.4 mg per clinic note in April 2017 due to his orthostatic hypotension, however patient states that he is still taking 0.8 mg (two 0.4 mg tabs) as this dose provides symptomatic relief.  He reports never having a colonoscopy and does not want to consider this at this time.  Past Medical History:  Diagnosis Date  . BPH (benign prostatic hyperplasia)   . Cataract of right eye   . Frozen shoulder   . HTN (hypertension)   . Hx of prostatitis 3.20.16   WITH E.COLI  . LBBB (left bundle branch block)   . Leishmaniasis    [place]  . [place]    . Oral candidiasis    WENT TO [place]  . Right tennis elbow   . SOB (shortness of breath)     Review of Systems:   Review of Systems  HENT:       Left nasal dryness with scabbing and occasional bleeding with picking at scab  Eyes: Positive for double vision. Negative for pain, discharge and redness.  Respiratory: Negative for shortness of breath and wheezing.   Cardiovascular: Negative for chest pain, palpitations and leg swelling.  Gastrointestinal: Negative for abdominal pain, heartburn, nausea and vomiting.  Genitourinary: Negative for dysuria and urgency.  Musculoskeletal: Negative for falls and joint pain.  Neurological: Negative for dizziness, tingling and headaches.     Physical Exam:  Vitals:   08/05/15 1602  BP: (!) 153/74  Pulse: 76  Temp: 98.1 F (36.7 C)  TempSrc: Oral  SpO2: 100%  Weight: 225 lb 12.8 oz (102.4 kg)  Height: 6\' 2"  (1.88 m)   Physical Exam  Constitutional: He is oriented to person, place, and time. He appears well-developed and well-nourished. No distress.  HENT:  Head: Normocephalic and atraumatic.  Left nasal septum with dry scabbed blood chronic in nature, no obvious septal perforation seen, no active epistaxis, no nasal polyps seen. Right nasal passage normal.  Eyes: EOM are normal.  Cardiovascular: Normal rate and regular rhythm.   No murmur  heard. Normal S1, prominent S2  Pulmonary/Chest: Effort normal. No respiratory distress. He has no wheezes. He has no rales.  Abdominal: Soft. Bowel sounds are normal. He exhibits no distension. There is no tenderness. There is no guarding.  Musculoskeletal: Normal range of motion. He exhibits no edema or tenderness.  Neurological: He is alert and oriented to person, place, and time.  Skin: He is not diaphoretic.    Assessment & Plan:   See Encounters Tab for problem based charting.  Patient discussed with Dr. Josem Kaufmann

## 2015-08-05 NOTE — Assessment & Plan Note (Signed)
Patient reports good symptomatic control on Tamsulosin 0.8 mg daily and Finasteride 5 mg daily. -Refilled Tamsulosin 0.8 mg daily -Monitor for symptoms of orthostasis while adding back low dose Lisinopril

## 2015-08-05 NOTE — Patient Instructions (Addendum)
It was a pleasure to see you again Stephen Hendricks.  I will refill your Tamsulosin today.  We will restart your Lisinopril at a lower dose of 10 mg per day.  Please let us know if you have dizziness with standing up again.  I will also refer you to Ophthalmology and ENT for your eyes and nose respectively.   Orthostatic Hypotension Orthostatic hypotension is a sudden drop in blood pressure. It happens when you quickly stand up from a seated or lying position. You may feel dizzy or light-headed. This can last for just a few seconds or for up to a few minutes. It is usually not a serious problem. However, if this happens frequently or gets worse, it can be a sign of something more serious. CAUSES  Different things can cause orthostatic hypotension, including:   Loss of body fluids (dehydration).  Medicines that lower blood pressure.  Sudden changes in posture, such as standing up quickly after you have been sitting or lying down.  Taking too much of your medicine. SIGNS AND SYMPTOMS   Light-headedness or dizziness.   Fainting or near-fainting.   A fast heart rate.   Weakness.   Feeling tired (fatigue).  DIAGNOSIS  Your health care provider may do several things to help diagnose your condition and identify the cause. These may include:   Taking a medical history and doing a physical exam.  Checking your blood pressure. Your health care provider will check your blood pressure when you are:  Lying down.  Sitting.  Standing.  Using tilt table testing. In this test, you lie down on a table that moves from a lying position to a standing position. You will be strapped onto the table. This test monitors your blood pressure and heart rate when you are in different positions. TREATMENT  Treatment will vary depending on the cause. Possible treatments include:   Changing the dosage of your medicines.  Wearing compression stockings on your lower legs.  Standing up slowly  after sitting or lying down.  Eating more salt.  Eating frequent, small meals.  In some cases, getting IV fluids.  Taking medicine to enhance fluid retention. HOME CARE INSTRUCTIONS  Only take over-the-counter or prescription medicines as directed by your health care provider.  Follow your health care provider's instructions for changing the dosage of your current medicines.  Do not stop or adjust your medicine on your own.  Stand up slowly after sitting or lying down. This allows your body to adjust to the different position.  Wear compression stockings as directed.  Eat extra salt as directed.  Do not add extra salt to your diet unless directed to by your health care provider.  Eat frequent, small meals.  Avoid standing suddenly after eating.  Avoid hot showers or excessive heat as directed by your health care provider.  Keep all follow-up appointments. SEEK MEDICAL CARE IF:  You continue to feel dizzy or light-headed after standing.  You feel groggy or confused.  You feel cold, clammy, or sick to your stomach (nauseous).  You have blurred vision.  You feel short of breath. SEEK IMMEDIATE MEDICAL CARE IF:   You faint after standing.  You have chest pain.  You have difficulty breathing.   You lose feeling or movement in your arms or legs.   You have slurred speech or difficulty talking, or you are unable to talk.  MAKE SURE YOU:   Understand these instructions.  Will watch your condition.  Will get help  right away if you are not doing well or get worse.   This information is not intended to replace advice given to you by your health care provider. Make sure you discuss any questions you have with your health care provider.   Document Released: 12/10/2001 Document Revised: 12/25/2012 Document Reviewed: 10/12/2012 Elsevier Interactive Patient Education [employer]

## 2015-08-05 NOTE — Assessment & Plan Note (Signed)
Reports history of bilateral cataracts with surgery on right eye in [place] in 2015. States he was to have surgery on left eye as well, but moved to the [place] prior to this procedure being performed. Now experiencing double vision for the last few months. He does not have an Ophthalmologist here. -Refer to Ophthalmology

## 2015-08-05 NOTE — Assessment & Plan Note (Signed)
BP slightly elevated today at 153/74. Currently taking only Amlodipine 5 mg daily after stopping Lisinopril due to orthostatic hypotension at increased dose of 40 mg. He has no symptoms of orthostatic hypotension since then. Also taking Tamsulosin 0.8 mg which can cause orthostasis as well. -Continue Amlodipine 5 mg daily -Restart Lisinopril at 10 mg daily -Caution to avoid aggressive BP control and return of orthostatic hypotension

## 2015-08-05 NOTE — Assessment & Plan Note (Signed)
Patient reports never having Colonoscopy in the past. He does not want a referral for colonoscopy at this time. -Defer Screening Colonoscopy referral -Patient may not gain benefit from colonoscopy at his age

## 2015-08-06 NOTE — Progress Notes (Signed)
Case discussed with Dr. Patel at the time of the visit.  We reviewed the resident's history and exam and pertinent patient test results.  I agree with the assessment, diagnosis, and plan of care documented in the resident's note. 

## 2015-08-13 ENCOUNTER — Encounter: Payer: Self-pay | Admitting: Internal Medicine

## 2015-08-30 ENCOUNTER — Encounter: Payer: Self-pay | Admitting: Internal Medicine

## 2015-08-31 MED ORDER — LOSARTAN POTASSIUM 25 MG PO TABS: 25.0000 mg | ORAL_TABLET | Freq: Every day | ORAL | 11 refills | Status: DC

## 2015-09-02 ENCOUNTER — Encounter: Payer: Self-pay | Admitting: Internal Medicine

## 2015-10-09 ENCOUNTER — Encounter (HOSPITAL_COMMUNITY): Payer: Self-pay | Admitting: Surgery

## 2015-10-09 ENCOUNTER — Ambulatory Visit (HOSPITAL_COMMUNITY): Payer: Medicaid Other | Admitting: Certified Registered"

## 2015-10-09 ENCOUNTER — Encounter (HOSPITAL_COMMUNITY)
Admission: RE | Disposition: A | Discharge status: Home / Self Care | Payer: Self-pay | Source: Ambulatory Visit | Attending: Ophthalmology

## 2015-10-09 ENCOUNTER — Ambulatory Visit: Payer: Self-pay | Admitting: Ophthalmology

## 2015-10-09 ENCOUNTER — Ambulatory Visit (HOSPITAL_COMMUNITY)
Admission: RE | Admit: 2015-10-09 | Discharge: 2015-10-09 | Disposition: A | Discharge status: Home / Self Care | Payer: Medicaid Other | Source: Ambulatory Visit | Attending: Ophthalmology | Admitting: Ophthalmology

## 2015-10-09 DIAGNOSIS — H4312 Vitreous hemorrhage, left eye: Secondary | ICD-10-CM | POA: Insufficient documentation

## 2015-10-09 DIAGNOSIS — H40052 Ocular hypertension, left eye: Secondary | ICD-10-CM | POA: Insufficient documentation

## 2015-10-09 DIAGNOSIS — Z803 Family history of malignant neoplasm of breast: Secondary | ICD-10-CM | POA: Insufficient documentation

## 2015-10-09 DIAGNOSIS — Z8619 Personal history of other infectious and parasitic diseases: Secondary | ICD-10-CM | POA: Diagnosis not present

## 2015-10-09 DIAGNOSIS — Z1889 Other specified retained foreign body fragments: Secondary | ICD-10-CM | POA: Diagnosis not present

## 2015-10-09 DIAGNOSIS — Z8249 Family history of ischemic heart disease and other diseases of the circulatory system: Secondary | ICD-10-CM | POA: Insufficient documentation

## 2015-10-09 DIAGNOSIS — I447 Left bundle-branch block, unspecified: Secondary | ICD-10-CM | POA: Insufficient documentation

## 2015-10-09 DIAGNOSIS — H18892 Other specified disorders of cornea, left eye: Secondary | ICD-10-CM | POA: Insufficient documentation

## 2015-10-09 DIAGNOSIS — Z8613 Personal history of malaria: Secondary | ICD-10-CM | POA: Insufficient documentation

## 2015-10-09 DIAGNOSIS — I10 Essential (primary) hypertension: Secondary | ICD-10-CM | POA: Diagnosis not present

## 2015-10-09 DIAGNOSIS — H33022 Retinal detachment with multiple breaks, left eye: Secondary | ICD-10-CM | POA: Diagnosis not present

## 2015-10-09 DIAGNOSIS — N400 Enlarged prostate without lower urinary tract symptoms: Secondary | ICD-10-CM | POA: Insufficient documentation

## 2015-10-09 HISTORY — PX: GAS INSERTION: SHX5336

## 2015-10-09 HISTORY — PX: PARS PLANA VITRECTOMY: SHX2166

## 2015-10-09 SURGERY — PARS PLANA VITRECTOMY 25 GAUGE FOR ENDOPHTHALMITIS
Anesthesia: Monitor Anesthesia Care | Site: Eye | Laterality: Left

## 2015-10-09 MED ORDER — EPINEPHRINE HCL 1 MG/ML IJ SOLN
INTRAMUSCULAR | Status: DC | PRN
  Administered 2015-10-09: 20:00:00

## 2015-10-09 MED ORDER — BSS IO SOLN
INTRAOCULAR | Status: AC
  Filled 2015-10-09: qty 15

## 2015-10-09 MED ORDER — DEXAMETHASONE SODIUM PHOSPHATE 10 MG/ML IJ SOLN
INTRAMUSCULAR | Status: AC
  Filled 2015-10-09: qty 1

## 2015-10-09 MED ORDER — HYALURONIDASE HUMAN 150 UNIT/ML IJ SOLN
INTRAMUSCULAR | Status: AC
  Filled 2015-10-09: qty 1

## 2015-10-09 MED ORDER — HYPROMELLOSE (GONIOSCOPIC) 2.5 % OP SOLN
OPHTHALMIC | Status: AC
  Filled 2015-10-09: qty 15

## 2015-10-09 MED ORDER — PROPOFOL 10 MG/ML IV BOLUS
INTRAVENOUS | Status: AC
  Filled 2015-10-09: qty 20

## 2015-10-09 MED ORDER — BSS PLUS IO SOLN
INTRAOCULAR | Status: AC
  Filled 2015-10-09: qty 500

## 2015-10-09 MED ORDER — HYPROMELLOSE (GONIOSCOPIC) 2.5 % OP SOLN
OPHTHALMIC | Status: DC | PRN
  Administered 2015-10-09: 2 [drp] via OPHTHALMIC

## 2015-10-09 MED ORDER — SODIUM CHLORIDE 0.9 % IV SOLN
INTRAVENOUS | Status: DC
  Administered 2015-10-09: 19:00:00 via INTRAVENOUS

## 2015-10-09 MED ORDER — BUPIVACAINE HCL (PF) 0.75 % IJ SOLN
INTRAMUSCULAR | Status: DC | PRN
  Administered 2015-10-09: 7 mL via RETROBULBAR

## 2015-10-09 MED ORDER — FENTANYL CITRATE (PF) 100 MCG/2ML IJ SOLN
INTRAMUSCULAR | Status: DC | PRN
  Administered 2015-10-09: 50 ug via INTRAVENOUS
  Administered 2015-10-09 (×2): 25 ug via INTRAVENOUS

## 2015-10-09 MED ORDER — CYCLOPENTOLATE HCL 1 % OP SOLN
OPHTHALMIC | Status: AC
  Administered 2015-10-09: 1 [drp] via OPHTHALMIC
  Filled 2015-10-09: qty 2

## 2015-10-09 MED ORDER — ONDANSETRON HCL 4 MG/2ML IJ SOLN
INTRAMUSCULAR | Status: DC | PRN
  Administered 2015-10-09: 4 mg via INTRAVENOUS

## 2015-10-09 MED ORDER — BUPIVACAINE HCL (PF) 0.75 % IJ SOLN
INTRAMUSCULAR | Status: AC
  Filled 2015-10-09: qty 10

## 2015-10-09 MED ORDER — MEPERIDINE HCL 25 MG/ML IJ SOLN: 6.2500 mg | INTRAMUSCULAR | Status: DC | PRN

## 2015-10-09 MED ORDER — TOBRAMYCIN-DEXAMETHASONE 0.3-0.1 % OP OINT
TOPICAL_OINTMENT | OPHTHALMIC | Status: AC
  Filled 2015-10-09: qty 3.5

## 2015-10-09 MED ORDER — MIDAZOLAM HCL 5 MG/5ML IJ SOLN
INTRAMUSCULAR | Status: DC | PRN
  Administered 2015-10-09: 2 mg via INTRAVENOUS

## 2015-10-09 MED ORDER — LIDOCAINE HCL 2 % IJ SOLN
INTRAMUSCULAR | Status: AC
  Filled 2015-10-09: qty 20

## 2015-10-09 MED ORDER — TRIAMCINOLONE ACETONIDE 40 MG/ML IJ SUSP
INTRAMUSCULAR | Status: AC
  Filled 2015-10-09: qty 5

## 2015-10-09 MED ORDER — ACETYLCHOLINE CHLORIDE 20 MG IO SOLR
INTRAOCULAR | Status: AC
  Filled 2015-10-09: qty 1

## 2015-10-09 MED ORDER — ONDANSETRON HCL 4 MG/2ML IJ SOLN: 4.0000 mg | Freq: Once | INTRAMUSCULAR | Status: DC | PRN

## 2015-10-09 MED ORDER — BSS IO SOLN
INTRAOCULAR | Status: DC | PRN
  Administered 2015-10-09 (×2): 15 mL

## 2015-10-09 MED ORDER — MIDAZOLAM HCL 2 MG/2ML IJ SOLN
INTRAMUSCULAR | Status: AC
  Filled 2015-10-09: qty 2

## 2015-10-09 MED ORDER — CYCLOPENTOLATE HCL 1 % OP SOLN
1.0000 [drp] | OPHTHALMIC | Status: AC | PRN
  Administered 2015-10-09 (×3): 1 [drp] via OPHTHALMIC

## 2015-10-09 MED ORDER — PROPOFOL 10 MG/ML IV BOLUS
INTRAVENOUS | Status: DC | PRN
  Administered 2015-10-09 (×2): 50 mg via INTRAVENOUS

## 2015-10-09 MED ORDER — TOBRAMYCIN-DEXAMETHASONE 0.3-0.1 % OP OINT
TOPICAL_OINTMENT | OPHTHALMIC | Status: DC | PRN
  Administered 2015-10-09: 1 via OPHTHALMIC

## 2015-10-09 MED ORDER — PHENYLEPHRINE HCL 2.5 % OP SOLN
1.0000 [drp] | OPHTHALMIC | Status: AC | PRN
  Administered 2015-10-09 (×3): 1 [drp] via OPHTHALMIC

## 2015-10-09 MED ORDER — EPINEPHRINE HCL 1 MG/ML IJ SOLN
INTRAMUSCULAR | Status: AC
  Filled 2015-10-09: qty 1

## 2015-10-09 MED ORDER — HYDROMORPHONE HCL 1 MG/ML IJ SOLN
0.2500 mg | INTRAMUSCULAR | Status: DC | PRN
Start: 2015-10-09 — End: 2015-10-10

## 2015-10-09 MED ORDER — ATROPINE SULFATE 1 % OP SOLN
OPHTHALMIC | Status: AC
  Filled 2015-10-09: qty 5

## 2015-10-09 MED ORDER — FENTANYL CITRATE (PF) 100 MCG/2ML IJ SOLN
INTRAMUSCULAR | Status: AC
  Filled 2015-10-09: qty 2

## 2015-10-09 MED ORDER — DEXAMETHASONE SODIUM PHOSPHATE 10 MG/ML IJ SOLN
INTRAMUSCULAR | Status: DC | PRN
  Administered 2015-10-09: 2 mg

## 2015-10-09 MED ORDER — PHENYLEPHRINE HCL 2.5 % OP SOLN
OPHTHALMIC | Status: AC
  Administered 2015-10-09: 1 [drp] via OPHTHALMIC
  Filled 2015-10-09: qty 2

## 2015-10-09 MED ORDER — TETRACAINE HCL 0.5 % OP SOLN
OPHTHALMIC | Status: AC
  Filled 2015-10-09: qty 2

## 2015-10-09 MED ORDER — ACETYLCHOLINE CHLORIDE 20 MG IO SOLR
INTRAOCULAR | Status: DC | PRN
  Administered 2015-10-09: 20 mg via INTRAOCULAR

## 2015-10-09 MED ORDER — ONDANSETRON HCL 4 MG/2ML IJ SOLN
INTRAMUSCULAR | Status: AC
  Filled 2015-10-09: qty 2

## 2015-10-09 MED ORDER — TROPICAMIDE 1 % OP SOLN
1.0000 [drp] | OPHTHALMIC | Status: DC | PRN
  Administered 2015-10-09: 1 [drp] via OPHTHALMIC
  Filled 2015-10-09: qty 3

## 2015-10-09 SURGICAL SUPPLY — 38 items
APPLICATOR COTTON TIP 6IN STRL (MISCELLANEOUS) ×3 IMPLANT
CANNULA VLV SOFT TIP 25GA (OPHTHALMIC) ×3 IMPLANT
CLOSURE STERI-STRIP 1/2X4 (GAUZE/BANDAGES/DRESSINGS) ×1
CLSR STERI-STRIP ANTIMIC 1/2X4 (GAUZE/BANDAGES/DRESSINGS) ×2 IMPLANT
CORDS BIPOLAR (ELECTRODE) ×3 IMPLANT
COVER MAYO STAND STRL (DRAPES) ×3 IMPLANT
DRAPE INCISE 51X51 W/FILM STRL (DRAPES) ×3 IMPLANT
DRAPE PROXIMA HALF (DRAPES) ×3 IMPLANT
DRAPE RETRACTOR (MISCELLANEOUS) ×3 IMPLANT
ERASER HMR WETFIELD 23G BP (MISCELLANEOUS) IMPLANT
GAS AUTO FILL CONSTEL (OPHTHALMIC) ×3
GAS AUTO FILL CONSTELLATION (OPHTHALMIC) ×1 IMPLANT
GLOVE BIOGEL PI IND STRL 7.5 (GLOVE) ×1 IMPLANT
GLOVE BIOGEL PI INDICATOR 7.5 (GLOVE) ×2
GOWN STRL REUS W/ TWL LRG LVL3 (GOWN DISPOSABLE) ×1 IMPLANT
GOWN STRL REUS W/TWL LRG LVL3 (GOWN DISPOSABLE) ×2
KIT BASIN OR (CUSTOM PROCEDURE TRAY) ×3 IMPLANT
KIT ROOM TURNOVER OR (KITS) ×3 IMPLANT
NEEDLE 18GX1X1/2 (RX/OR ONLY) (NEEDLE) ×3 IMPLANT
NEEDLE 25GX 5/8IN NON SAFETY (NEEDLE) ×3 IMPLANT
NEEDLE FILTER BLUNT 18X 1/2SAF (NEEDLE) ×2
NEEDLE FILTER BLUNT 18X1 1/2 (NEEDLE) ×1 IMPLANT
NEEDLE HYPO 25GX1X1/2 BEV (NEEDLE) IMPLANT
NEEDLE HYPO 30X.5 LL (NEEDLE) ×6 IMPLANT
NEEDLE RETROBULBAR 25GX1.5 (NEEDLE) ×3 IMPLANT
NS IRRIG 1000ML POUR BTL (IV SOLUTION) ×3 IMPLANT
PACK VITRECTOMY CUSTOM (CUSTOM PROCEDURE TRAY) ×3 IMPLANT
PAD ARMBOARD 7.5X6 YLW CONV (MISCELLANEOUS) ×6 IMPLANT
PAK PIK VITRECTOMY CVS 25GA (OPHTHALMIC) ×3 IMPLANT
PROBE LASER ILLUM FLEX CVD 25G (OPHTHALMIC) ×3 IMPLANT
STOPCOCK 4 WAY LG BORE MALE ST (IV SETS) IMPLANT
SYR 20CC LL (SYRINGE) ×3 IMPLANT
SYR 5ML LL (SYRINGE) ×3 IMPLANT
SYR TB 1ML LUER SLIP (SYRINGE) IMPLANT
SYRINGE 10CC LL (SYRINGE) IMPLANT
TOWEL OR 17X24 6PK STRL BLUE (TOWEL DISPOSABLE) ×6 IMPLANT
WATER STERILE IRR 1000ML POUR (IV SOLUTION) ×3 IMPLANT
WIPE INSTRUMENT VISIWIPE 73X73 (MISCELLANEOUS) IMPLANT

## 2015-10-09 NOTE — Transfer of Care (Signed)
Immediate Anesthesia Transfer of Care Note  Patient: Stephen Hendricks  Procedure(s) Performed: Procedure(s): 25 GAUGE VITRECTOMY WITH ENDOLASER (Left) INSERTION OF SF6 GAS (Left)  Patient Location: PACU  Anesthesia Type:MAC and Regional  Level of Consciousness: awake, alert  and oriented  Airway & Oxygen Therapy: Patient Spontanous Breathing  Post-op Assessment: Report given to RN and Post -op Vital signs reviewed and stable  Post vital signs: Reviewed and stable  Last Vitals:  Vitals:   10/09/15 2125  Temp: (P) 36.2 C    Last Pain:  Vitals:   10/09/15 2125  PainSc: (P) 0-No pain      Patients Stated Pain Goal: 3 (10/09/15 1847)  Complications: No apparent anesthesia complications

## 2015-10-09 NOTE — H&P (Signed)
Date of examination:  10/09/15  Indication for surgery: Pupillary block, ocular hypertension, aqueous misdirection  Pertinent past medical history:  Past Medical History:  Diagnosis Date  . BPH (benign prostatic hyperplasia)   . Cataract of right eye   . Frozen shoulder   . HTN (hypertension)   . Hx of prostatitis 3.20.16   WITH E.COLI  . LBBB (left bundle branch block)   . Leishmaniasis    [place]  . [place]    [place]  . Oral candidiasis    [place]  . Right tennis elbow   . SOB (shortness of breath)     Pertinent ocular history:  Recent CE/IOL with posterior capsular rupture and sulcus IOL  Pertinent family history:  Family History  Problem Relation Age of Onset  . Cardiomyopathy Mother   . Other Father     PROSTATE DISEASE  . Cancer Daughter     BREAST  . Heart attack Neg Hx   . Hypertension Neg Hx   . Stroke Neg Hx     General:  Healthy appearing patient in no distress.     Eyes:    Acuity OS HM [place]  External: Within normal limits   Anterior segment: very shallow AC with pupillary block  Cornea: haze with corneal edema and bullae  Motility:   nl  Fundus: hazy view of posterior pole OS, retinal attached.      Impression:Pupillary block with aqueoud misdirection, retained cortical fragment  Plan: PPVx, endolaser, gas left eye  Harrold DonathNarendra Mafabhai Tinamarie Przybylski

## 2015-10-09 NOTE — Brief Op Note (Signed)
10/09/2015  9:16 PM  PATIENT:  Stephen Hendricks  75 y.o. male  PRE-OPERATIVE DIAGNOSIS:  Retained cortical material, aqueous misdirection, elevated intraocular pressure, shallow anterior chamber.    POST-OPERATIVE DIAGNOSIS:  Retained cortical material, aqueous misdirection, elevated intraocular pressure, shallow anterior chamber. Vitreous hemorrhage, multiple retinal tears, hydration of cortical material in capsular bag  PROCEDURE:  Procedure(s): 25 GAUGE VITRECTOMY WITH ENDOLASER (Left) INSERTION OF SF6 GAS (Left)  SURGEON:  Surgeon(s) and Role:    * Carmela RimaNarendra Ellen Goris, MD - Primary  PHYSICIAN ASSISTANT:   ASSISTANTS: none   ANESTHESIA:   regional and MAC  EBL:  No intake/output data recorded.  BLOOD ADMINISTERED:none  DRAINS: none   LOCAL MEDICATIONS USED:  MARCAINE    and LIDOCAINE   SPECIMEN:  No Specimen  DISPOSITION OF SPECIMEN:  N/A  COUNTS:  YES  TOURNIQUET:  * No tourniquets in log *  DICTATION: .Note written in paper chart and Note written in EPIC  PLAN OF CARE: Discharge to home after PACU  PATIENT DISPOSITION:  PACU - hemodynamically stable.   Delay start of Pharmacological VTE agent (>24hrs) due to surgical blood loss or risk of bleeding: not applicable

## 2015-10-09 NOTE — Discharge Instructions (Signed)
DO NOT SLEEP ON BACK.  SLEEP FACE DOWN OR RIGHT SIDE DOWN WITH NOSE POINTED TO PILLOW

## 2015-10-09 NOTE — Op Note (Signed)
  PATIENT:  Stephen Hendricks  75 y.o. male  PRE-OPERATIVE DIAGNOSIS:  Retained cortical material, aqueous misdirection, elevated intraocular pressure, shallow anterior chamber.    POST-OPERATIVE DIAGNOSIS:  Retained cortical material, aqueous misdirection, elevated intraocular pressure, shallow anterior chamber. Vitreous hemorrhage, multiple retinal tears, shallow retinal detachment, hydration of cortical material in capsular bag  PROCEDURE:  Procedure(s): 25 GAUGE VITRECTOMY WITH ENDOLASER, INSERTION OF SF6 GAS , removal of cortical material from anterior chamber, removal of cortical material from posterior chamber, repair of retinal tears and localized detachment, closure of corneal wound (Left)  SURGEON:  Surgeon(s) and Role:    * Carmela RimaNarendra Arrion Burruel, MD - Primary  PHYSICIAN ASSISTANT:   ASSISTANTS: none   ANESTHESIA:   regional and MAC    The  patient was prepped and draped in the usual fashion for ocular surgery on the  left eye .  A lid speculum was placed.  Infusion line and trocar was placed at the 4 o'clock position approximately 3.5 mm from the surgical limbus.   The infusion line was allowed to run and then clamped when placed at the cannula opening. The line was inserted and secured to the drape with an adhesive strip.   Active trocars/cannula were placed at the 10 and 2 o'clock positions approximately 3.5 mm from the surgical limbus. The cannula was visualized in the vitreous cavity.  Due to epithelial bullae, the corneal epithelium was scraped centrally and the epithelium irrigated free after removing from the field.  The clear corneal cataract wound was noted to be approximately 7mm.  4 interrupted 10-0 nylon sutures were placed to ensure that the wound was water tight.  The light pipe and vitreous cutter were inserted into the vitreous cavity and a core vitrectomy was performed.  Care taken to remove the vitreous up to the vitreous base for 360 degrees.  Cortical fragments  were removed and the posterior hyaloid elevated and vitrectomized.    Attention was directed to the annulus of hydrated cortical material causing anterior rotation of the sulcus IOL in apposition to the iris.  The hydrated cortical material was carefully removed with the vitrector.    With the view to the periphery regained, careful peripheral vitrectomy was performed.  Several retinal breaks were noted at 3:30, 11:30, 1:00 and 12:30.  The superior breaks were associated with a shallow retinal detachment.  Additionally there was associated vitreous hemorrhage from the torn bridging vessels superiorly and temporally.    Endolaser was applied surrounding each tear and 360 degrees to the ora.  A complete air fluid exchange was performed.  Miochol was used to constrict the pupil and open the angle.  18% SF6 was placed in the eye.    The trocars were sequentially removed and noted to be air tight.   Subconjunctival injection of  Dexamethasone 4mg /401ml was placed.   The speculum and drapes were removed and the eye was patched with Polymixin/Bacitracin ophthalmic ointment. An eye shield was placed and the patient was transferred alert and conversant with stable vital signs to the post operative recovery area.  The patient tolerated the procedure well and no complications were noted.  Harrold DonathNarendra Mafabhai Kaylum Shrum MD

## 2015-10-09 NOTE — Anesthesia Procedure Notes (Signed)
Procedure Name: MAC Date/Time: 10/09/2015 7:44 PM Performed by: Sheppard EvensMANESS, Frisco Cordts B Pre-anesthesia Checklist: Patient identified, Emergency Drugs available, Suction available, Patient being monitored and Timeout performed Patient Re-evaluated:Patient Re-evaluated prior to inductionOxygen Delivery Method: Nasal cannula

## 2015-10-09 NOTE — OR Nursing (Signed)
Gas bracelet applied to left wrist at 2120.

## 2015-10-09 NOTE — Anesthesia Preprocedure Evaluation (Signed)
Anesthesia Evaluation  Patient identified by MRN, date of birth, ID band Patient awake    Reviewed: Allergy & Precautions, NPO status , Patient's Chart, lab work & pertinent test results  Airway Mallampati: I  TM Distance: >3 FB Neck ROM: Full    Dental   Pulmonary former smoker,    Pulmonary exam normal        Cardiovascular hypertension, Pt. on medications Normal cardiovascular exam     Neuro/Psych    GI/Hepatic   Endo/Other    Renal/GU      Musculoskeletal   Abdominal   Peds  Hematology   Anesthesia Other Findings   Reproductive/Obstetrics                             Anesthesia Physical Anesthesia Plan  ASA: II  Anesthesia Plan: MAC   Post-op Pain Management:    Induction: Intravenous  Airway Management Planned: Simple Face Mask  Additional Equipment:   Intra-op Plan:   Post-operative Plan:   Informed Consent: I have reviewed the patients History and Physical, chart, labs and discussed the procedure including the risks, benefits and alternatives for the proposed anesthesia with the patient or authorized representative who has indicated his/her understanding and acceptance.     Plan Discussed with: CRNA and Surgeon  Anesthesia Plan Comments:         Anesthesia Quick Evaluation  

## 2015-10-10 ENCOUNTER — Ambulatory Visit: Payer: Self-pay | Admitting: Ophthalmology

## 2015-10-10 ENCOUNTER — Emergency Department (HOSPITAL_COMMUNITY)
Admission: EM | Admit: 2015-10-10 | Discharge: 2015-10-10 | Discharge status: Absent without leave | Payer: Medicaid Other

## 2015-10-10 ENCOUNTER — Encounter (HOSPITAL_COMMUNITY): Admission: EM | Disposition: A | Discharge status: Home / Self Care | Payer: Self-pay | Attending: Ophthalmology

## 2015-10-10 ENCOUNTER — Ambulatory Visit (HOSPITAL_COMMUNITY)
Admission: EM | Admit: 2015-10-10 | Discharge: 2015-10-10 | Disposition: A | Discharge status: Home / Self Care | Payer: Medicaid Other | Source: Intra-hospital | Attending: Ophthalmology | Admitting: Ophthalmology

## 2015-10-10 ENCOUNTER — Inpatient Hospital Stay (HOSPITAL_COMMUNITY): Payer: Medicaid Other | Admitting: Anesthesiology

## 2015-10-10 ENCOUNTER — Encounter (HOSPITAL_COMMUNITY): Payer: Self-pay | Admitting: *Deleted

## 2015-10-10 DIAGNOSIS — I447 Left bundle-branch block, unspecified: Secondary | ICD-10-CM | POA: Diagnosis not present

## 2015-10-10 DIAGNOSIS — N400 Enlarged prostate without lower urinary tract symptoms: Secondary | ICD-10-CM | POA: Diagnosis not present

## 2015-10-10 DIAGNOSIS — Z809 Family history of malignant neoplasm, unspecified: Secondary | ICD-10-CM | POA: Insufficient documentation

## 2015-10-10 DIAGNOSIS — Z87891 Personal history of nicotine dependence: Secondary | ICD-10-CM | POA: Insufficient documentation

## 2015-10-10 DIAGNOSIS — I10 Essential (primary) hypertension: Secondary | ICD-10-CM | POA: Diagnosis not present

## 2015-10-10 DIAGNOSIS — H44412 Flat anterior chamber hypotony of left eye: Secondary | ICD-10-CM | POA: Diagnosis not present

## 2015-10-10 DIAGNOSIS — H40052 Ocular hypertension, left eye: Secondary | ICD-10-CM | POA: Diagnosis present

## 2015-10-10 DIAGNOSIS — M1990 Unspecified osteoarthritis, unspecified site: Secondary | ICD-10-CM | POA: Insufficient documentation

## 2015-10-10 DIAGNOSIS — Z87898 Personal history of other specified conditions: Secondary | ICD-10-CM | POA: Insufficient documentation

## 2015-10-10 DIAGNOSIS — Z8249 Family history of ischemic heart disease and other diseases of the circulatory system: Secondary | ICD-10-CM | POA: Insufficient documentation

## 2015-10-10 HISTORY — PX: CORNEAL LACERATION REPAIR: SHX5331

## 2015-10-10 SURGERY — REPAIR, LACERATION, CORNEA
Anesthesia: Monitor Anesthesia Care | Site: Eye | Laterality: Left

## 2015-10-10 MED ORDER — LIDOCAINE HCL 2 % IJ SOLN
INTRAMUSCULAR | Status: AC
  Filled 2015-10-10: qty 20

## 2015-10-10 MED ORDER — LIDOCAINE HCL 2 % IJ SOLN
INTRAMUSCULAR | Status: DC | PRN
  Administered 2015-10-10: .5 mL

## 2015-10-10 MED ORDER — TETRACAINE HCL 0.5 % OP SOLN
2.0000 [drp] | OPHTHALMIC | Status: AC
  Administered 2015-10-10: 2 [drp] via OPHTHALMIC

## 2015-10-10 MED ORDER — TETRACAINE HCL 0.5 % OP SOLN
OPHTHALMIC | Status: AC
  Administered 2015-10-10: 2 [drp] via OPHTHALMIC
  Filled 2015-10-10: qty 2

## 2015-10-10 MED ORDER — TETRACAINE HCL 0.5 % OP SOLN
OPHTHALMIC | Status: AC
  Filled 2015-10-10: qty 2

## 2015-10-10 MED ORDER — BSS IO SOLN
INTRAOCULAR | Status: AC
  Filled 2015-10-10: qty 15

## 2015-10-10 MED ORDER — PROVISC 10 MG/ML IO SOLN
0.8500 mL | INTRAOCULAR | Status: DC
  Filled 2015-10-10: qty 0.85

## 2015-10-10 MED ORDER — TETRACAINE HCL 0.5 % OP SOLN
OPHTHALMIC | Status: DC | PRN
  Administered 2015-10-10: 1 [drp] via OPHTHALMIC

## 2015-10-10 MED ORDER — BSS IO SOLN
INTRAOCULAR | Status: DC | PRN
  Administered 2015-10-10: 30 mL

## 2015-10-10 SURGICAL SUPPLY — 27 items
APPLICATOR DR MATTHEWS STRL (MISCELLANEOUS) IMPLANT
BAG ISOLATION DRAPE 18X18 (DRAPES) IMPLANT
BLADE EYE CATARACT 19 1.4 BEAV (BLADE) IMPLANT
BLADE MVR KNIFE 20G (BLADE) IMPLANT
CAUTERY EYE LOW TEMP 1300F FIN (OPHTHALMIC RELATED) IMPLANT
CORDS BIPOLAR (ELECTRODE) ×3 IMPLANT
COTTONBALL LRG STERILE PKG (GAUZE/BANDAGES/DRESSINGS) ×9 IMPLANT
COVER SURGICAL LIGHT HANDLE (MISCELLANEOUS) ×3 IMPLANT
DRAPE ISOLATION BAG 18X18 (DRAPES)
DRAPE OPHTHALMIC 40X48 W POUCH (DRAPES) ×3 IMPLANT
ERASER HMR WETFIELD 23G BP (MISCELLANEOUS) IMPLANT
GAUZE SPONGE 4X4 12PLY STRL (GAUZE/BANDAGES/DRESSINGS) ×3 IMPLANT
GOWN STRL REUS W/ TWL LRG LVL3 (GOWN DISPOSABLE) ×1 IMPLANT
GOWN STRL REUS W/TWL LRG LVL3 (GOWN DISPOSABLE) ×2
KIT BASIN OR (CUSTOM PROCEDURE TRAY) ×3 IMPLANT
LENS BIOM SUPER VIEW SET DISP (OPHTHALMIC RELATED) IMPLANT
NS IRRIG 1000ML POUR BTL (IV SOLUTION) ×3 IMPLANT
PACK CATARACT CUSTOM (CUSTOM PROCEDURE TRAY) ×3 IMPLANT
PAD ARMBOARD 7.5X6 YLW CONV (MISCELLANEOUS) ×6 IMPLANT
ROLLS DENTAL (MISCELLANEOUS) IMPLANT
SPEAR EYE SURG WECK-CEL (MISCELLANEOUS) ×6 IMPLANT
SPECIMEN JAR SMALL (MISCELLANEOUS) IMPLANT
SUT CHROMIC 7 0 TG140 8 (SUTURE) ×3 IMPLANT
SUT ETHILON 10 0 CS140 6 (SUTURE) ×6 IMPLANT
SUT ETHILON 9 0 TG140 8 (SUTURE) ×3 IMPLANT
TOWEL OR 17X24 6PK STRL BLUE (TOWEL DISPOSABLE) ×6 IMPLANT
WATER STERILE IRR 1000ML POUR (IV SOLUTION) ×3 IMPLANT

## 2015-10-10 NOTE — Brief Op Note (Signed)
10/10/2015  7:23 PM  PATIENT:  Stephen Hendricks  75 y.o. 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  BLOOD ADMINISTERED:none  DRAINS: none   LOCAL MEDICATIONS USED:  LIDOCAINE   SPECIMEN:  No Specimen  DISPOSITION OF SPECIMEN:  N/A  COUNTS:  YES  TOURNIQUET:  * No tourniquets in log *  DICTATION: .Note written in EPIC  PLAN OF CARE: Discharge to home after PACU  PATIENT DISPOSITION:  PACU - hemodynamically stable.   Delay start of Pharmacological VTE agent (>24hrs) due to surgical blood loss or risk of bleeding: no

## 2015-10-10 NOTE — Anesthesia Preprocedure Evaluation (Signed)
Anesthesia Evaluation  Patient identified by MRN, date of birth, ID band Patient awake    Reviewed: Allergy & Precautions, NPO status , Patient's Chart, lab work & pertinent test results  Airway Mallampati: I  TM Distance: >3 FB Neck ROM: Full    Dental  (+) Dental Advisory Given   Pulmonary former smoker,    Pulmonary exam normal breath sounds clear to auscultation       Cardiovascular hypertension, Pt. on medications Normal cardiovascular exam+ dysrhythmias  Rhythm:Regular     Neuro/Psych    GI/Hepatic   Endo/Other    Renal/GU      Musculoskeletal  (+) Arthritis ,   Abdominal   Peds  Hematology   Anesthesia Other Findings   Reproductive/Obstetrics                             Anesthesia Physical Anesthesia Plan  ASA: II  Anesthesia Plan: MAC   Post-op Pain Management:    Induction:   Airway Management Planned: Natural Airway  Additional Equipment:   Intra-op Plan:   Post-operative Plan:   Informed Consent: I have reviewed the patients History and Physical, chart, labs and discussed the procedure including the risks, benefits and alternatives for the proposed anesthesia with the patient or authorized representative who has indicated his/her understanding and acceptance.   Dental advisory given  Plan Discussed with: Surgeon and CRNA  Anesthesia Plan Comments:         Anesthesia Quick Evaluation

## 2015-10-10 NOTE — Progress Notes (Signed)
Report given to Lynn, RN

## 2015-10-10 NOTE — H&P (Signed)
Date of examination:  10/10/15  Indication for surgery: flattened anterior chamber and elevated intraocular pressure  Pertinent past medical history:  Past Medical History:  Diagnosis Date  . BPH (benign prostatic hyperplasia)   . Cataract of right eye   . Frozen shoulder   . HTN (hypertension)   . Hx of prostatitis 3.20.16   WITH E.COLI  . LBBB (left bundle branch block)   . Leishmaniasis    WENT TO [place]  . [place]    [place]  . Oral candidiasis    WENT TO [place]  . Right tennis elbow   . SOB (shortness of breath)     Pertinent ocular history:  Patient did not position well and anterior chamber flattened with elevated intraocular pressure after uncomplicated vitrectomy and placement of SF6 gas for intraocular tamponade of retinal tears/detachment  Pertinent family history:  Family History  Problem Relation Age of Onset  . Cardiomyopathy Mother   . Other Father     PROSTATE DISEASE  . Cancer Daughter     BREAST  . Heart attack Neg Hx   . Hypertension Neg Hx   . Stroke Neg Hx     General:  Healthy appearing patient in no distress.     Eyes:    Acuity  OS 20/HM Nash  External: Within normal limits   Anterior segment: flattened anterior chamber with IOL touching endothelium and complete closure of angle.  Motility:    Fundus: 90% gas fill with attached retina.     Impression: Flattened anterior chamber with IOL touching endothelium and complete closure of angle with associated elevated intraocular pressure  Plan: Wound revision with reformation of anterior chamber  Stephen Hendricks

## 2015-10-10 NOTE — Discharge Instructions (Signed)
DO NOT SLEEP ON BACK  SLEEP WITH FACE DOWN!!!  DURING DAY KEEP FACE DOWN!!!  DROPS AS INSTRUCTED PREVIOUSLY

## 2015-10-10 NOTE — Progress Notes (Signed)
Pt came from OR, no IV's no sedation. Only local given . Admitted Phase 2 . Alert oriented. DC instruction given to Pt. &  Wife. Stayed <30 min.

## 2015-10-10 NOTE — Op Note (Signed)
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  A lid speculum was placed.   The corneal wound was revised with two mattress sutures.  The anterior chamber was reformed with BSS.  Viscoat was added under the corneal wound to ensure adequate closure.  Gas was removed from the posterior chamber to ensure no posterior pressure.    The speculum and drapes were removed and the eye was patched with Polymixin/Bacitracin ophthalmic ointment. An eye shield was placed and the patient was transferred alert and conversant with stable vital signs to the post operative recovery area.  The patient tolerated the procedure well and no complications were noted.  Harrold DonathNarendra Mafabhai Taylia Berber MD

## 2015-10-10 NOTE — Transfer of Care (Signed)
Immediate Anesthesia Transfer of Care Note  Patient: Stephen Hendricks  Procedure(s) Performed: Procedure(s): WOUND REVISION WITH REFORMATION OF ANTERIOR CHAMBER (Left)  Patient Location: PACU  Anesthesia Type:MAC  Level of Consciousness: awake, alert  and oriented  Airway & Oxygen Therapy: Patient Spontanous Breathing  Post-op Assessment: Report given to RN and Post -op Vital signs reviewed and stable  Post vital signs: Reviewed and stable  Last Vitals:  Vitals:   10/10/15 1818  BP: (!) 144/72  Pulse: 67  Resp: 16  Temp: 36.8 C    Last Pain:  Vitals:   10/10/15 1818  TempSrc: Oral         Complications: No apparent anesthesia complications

## 2015-10-10 NOTE — Anesthesia Postprocedure Evaluation (Signed)
Anesthesia Post Note  Patient: Stephen Hendricks  Procedure(s) Performed: Procedure(s) (LRB): 25 GAUGE VITRECTOMY WITH ENDOLASER, removal of cortical material from anterior chamber, removal of cortical material from posterior chamber, repair of retinal tears and localized detachment, closure of corneal wound (Left (Left) INSERTION OF SF6 GAS (Left)  Patient location during evaluation: PACU Anesthesia Type: MAC Level of consciousness: awake and alert Pain management: pain level controlled Vital Signs Assessment: post-procedure vital signs reviewed and stable Respiratory status: spontaneous breathing, nonlabored ventilation, respiratory function stable and patient connected to nasal cannula oxygen Cardiovascular status: stable and blood pressure returned to baseline Anesthetic complications: no    Last Vitals:  Vitals:   10/09/15 2134 10/09/15 2141  BP: (!) 156/83 138/82  Pulse: 66 65  Resp: (!) 21 16  Temp:      Last Pain:  Vitals:   10/09/15 2125  PainSc: 0-No pain                 Kaysey Berndt DAVID

## 2015-10-10 NOTE — Anesthesia Postprocedure Evaluation (Signed)
Anesthesia Post Note  Patient: Stephen Hendricks  Procedure(s) Performed: Procedure(s) (LRB): WOUND REVISION WITH REFORMATION OF ANTERIOR CHAMBER (Left)  Patient location during evaluation: PACU Anesthesia Type: MAC Level of consciousness: awake Pain management: pain level controlled Vital Signs Assessment: post-procedure vital signs reviewed and stable Respiratory status: spontaneous breathing Cardiovascular status: stable Postop Assessment: no signs of nausea or vomiting Anesthetic complications: no    Last Vitals:  Vitals:   10/10/15 1930 10/10/15 1951  BP: (!) 158/85 (!) 154/82  Pulse: 62 64  Resp: 16 15  Temp:  36.5 C    Last Pain:  Vitals:   10/10/15 1818  TempSrc: Oral                 Neeley Sedivy

## 2015-10-12 ENCOUNTER — Encounter (HOSPITAL_COMMUNITY): Payer: Self-pay | Admitting: Ophthalmology

## 2015-10-14 ENCOUNTER — Ambulatory Visit (INDEPENDENT_AMBULATORY_CARE_PROVIDER_SITE_OTHER): Payer: Medicaid Other | Admitting: *Deleted

## 2015-10-14 ENCOUNTER — Other Ambulatory Visit: Payer: Self-pay | Admitting: Internal Medicine

## 2015-10-14 DIAGNOSIS — N401 Enlarged prostate with lower urinary tract symptoms: Secondary | ICD-10-CM

## 2015-10-14 DIAGNOSIS — Z0000 Encounter for general adult medical examination without abnormal findings: Secondary | ICD-10-CM

## 2015-10-14 DIAGNOSIS — Z23 Encounter for immunization: Secondary | ICD-10-CM | POA: Diagnosis present

## 2015-10-14 DIAGNOSIS — I10 Essential (primary) hypertension: Secondary | ICD-10-CM

## 2015-10-14 DIAGNOSIS — R3916 Straining to void: Principal | ICD-10-CM

## 2015-10-14 MED ORDER — AMLODIPINE BESYLATE 5 MG PO TABS: 5.0000 mg | ORAL_TABLET | Freq: Every day | ORAL | 0 refills | Status: DC

## 2015-10-14 MED ORDER — TAMSULOSIN HCL 0.4 MG PO CAPS: 0.8000 mg | ORAL_CAPSULE | Freq: Every day | ORAL | 0 refills | Status: DC

## 2015-10-14 MED ORDER — FINASTERIDE 5 MG PO TABS: 5.0000 mg | ORAL_TABLET | Freq: Every day | ORAL | 0 refills | Status: AC

## 2015-10-14 MED ORDER — ZOSTER VACCINE LIVE 19400 UNT/0.65ML ~~LOC~~ SUSR: 0.6500 mL | Freq: Once | SUBCUTANEOUS | 0 refills | Status: AC

## 2015-10-14 MED ORDER — FAMOTIDINE 20 MG PO TABS: ORAL_TABLET | ORAL | 0 refills | Status: DC

## 2015-10-14 MED ORDER — LOSARTAN POTASSIUM 25 MG PO TABS: 25.0000 mg | ORAL_TABLET | Freq: Every day | ORAL | 0 refills | Status: DC

## 2015-10-14 NOTE — Progress Notes (Signed)
Patient requesting vaccinations and medication refills prior to 4 month trip to [place]. We have given the influenza vaccine and PCV13 today and I have provided a printed prescription for Zostavax. I have refilled the following medications for a 5 month supply: Amlodipine, Losartan, Famotidine, Finasteride, and Tamsulosin.  He will follow up with me in February 2018.

## 2015-11-04 ENCOUNTER — Encounter: Payer: Self-pay | Admitting: *Deleted

## 2015-11-16 NOTE — Progress Notes (Signed)
Cardiology Office Note   Date:  11/17/2015   ID:  Stephen Hendricks, DOB August 30, 1940, MRN [id number]  PCP:  Darreld Mclean, MD  Cardiologist:   Chilton Si, MD   Chief Complaint  Patient presents with  . Follow-up  . Shortness of Breath    occassionally.  . Fatigue    occassionally.      History of Present Illness: Stephen Hendricks is a 75 y.o. male with hypertension, hyperlipidemia, LBBB who presents for follow up.  He saw Wilburt Finlay 10/23/14 for evaluation of LBBB.  At the time he was feeling well.He had an echo 10/17/14 that revealed LVEF 55-60% and grade 1 diastolic dysfunction. Pulmonary pressures were at the upper limit of normal at 38 mmHg. He also had a Lexiscan Myoview10/14/17 with LVEF 52% and no evidence of ischemia. He presents today with a complaint of shortness of breath with minimal exertion. After walking up one flight of stairs or even after walking for a distance on flat land he notes increased shortness of breath. He denies any associated chest pain, palpitations, lightheadedness or dizziness. He does complain of right knee pain this prevents him from exercising regularly. He occasionally goes to the gym and uses an exercise bike. In the past he was much more physically active. He denies any recent weight change. He notes that he drinks approximately 5 alcoholic beverages each evening. He quit smoking in 1993 after smoking 2-3 packs daily for 35 years.  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  He is from [place] and frequently travels there for work.  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  The last time he had malaria was nearly 7 years ago.     Past Medical History:  Diagnosis Date  . BPH (benign prostatic hyperplasia)   . Cataract of right eye   . Frozen shoulder   . HTN (hypertension)   . Hx of prostatitis 3.20.16   WITH E.COLI  . LBBB (left bundle branch block)   . Leishmaniasis    [place]  . [place]    [place]    . Oral candidiasis    WENT TO [place]  . Right tennis elbow   . SOB (shortness of breath)     Past Surgical History:  Procedure Laterality Date  . CORNEAL LACERATION REPAIR Left 10/10/2015   Procedure: WOUND REVISION WITH REFORMATION OF ANTERIOR CHAMBER;  Surgeon: Carmela Rima, MD;  Location: [facility] OR;  Service: Ophthalmology;  Laterality: Left;  Marland Kitchen GAS INSERTION Left 10/09/2015   Procedure: INSERTION OF SF6 GAS;  Surgeon: Carmela Rima, MD;  Location: [facility] OR;  Service: Ophthalmology;  Laterality: Left;  . PARS PLANA VITRECTOMY Left 10/09/2015   Procedure: 25 GAUGE VITRECTOMY WITH ENDOLASER, removal of cortical material from anterior chamber, removal of cortical material from posterior chamber, repair of retinal tears and localized detachment, closure of corneal wound (Left;  Surgeon: Carmela Rima, MD;  Location: [facility] OR;  Service: Ophthalmology;  Laterality: Left;  . 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 TREATMENT FISTULA ANAL  1980   pt really couldn't remember     Current Outpatient Prescriptions  Medication Sig Dispense Refill  . amLODipine (NORVASC) 5 MG tablet Take 1 tablet (5 mg total) by mouth daily. 150 tablet 0  . aspirin EC 81 MG tablet Take 81 mg by mouth daily.    . famotidine (PEPCID) 20 MG tablet TAKE 1 TABLET (20 MG TOTAL) BY MOUTH 2 (TWO) TIMES DAILY. 300 tablet  0  . finasteride (PROSCAR) 5 MG tablet Take 1 tablet (5 mg total) by mouth daily. 150 tablet 0  . gabapentin (NEURONTIN) 100 MG capsule Take 900 mg by mouth at bedtime.     Marland Kitchen. ibuprofen (ADVIL,MOTRIN) 200 MG tablet Take 400 mg by mouth daily as needed for headache (pain).    Marland Kitchen. losartan (COZAAR) 25 MG tablet Take 1 tablet (25 mg total) by mouth daily. 150 tablet 0  . sodium chloride (OCEAN) 0.65 % nasal spray Place 2 sprays into the nose daily as needed for congestion.     . tamsulosin (FLOMAX) 0.4 MG CAPS capsule Take 2 capsules (0.8 mg total) by mouth daily after supper. 300 capsule 0  . Travoprost, BAK Free,  (TRAVATAN) 0.004 % SOLN ophthalmic solution Place 1 drop into the right eye at bedtime.     No current facility-administered medications for this visit.     Allergies:   Patient has no known allergies.    Social History:  The patient  reports that he quit smoking about 24 years ago. He started smoking about 59 years ago. He has never used smokeless tobacco. He reports that he drinks about 1.8 - 2.4 oz of alcohol per week . He reports that he does not use drugs.   Family History:  The patient's family history includes Cancer in his daughter; Cardiomyopathy in his mother; Diabetes in his brother; Other in his father.    ROS:  Please see the history of present illness.   Otherwise, review of systems are positive for R knee pain.   All other systems are reviewed and negative.    PHYSICAL EXAM: VS:  BP (!) 149/79   Pulse 85   Ht 6\' 2"  (1.88 m)   Wt 103.3 kg (227 lb 12.8 oz)   BMI 29.25 kg/m  , BMI Body mass index is 29.25 kg/m. GENERAL:  Well appearing HEENT:  Pupils equal round and reactive, fundi not visualized, oral mucosa unremarkable NECK:  No jugular venous distention, waveform within normal limits, carotid upstroke brisk and symmetric, no bruits, no thyromegaly LYMPHATICS:  No cervical adenopathy LUNGS:  Clear to auscultation bilaterally HEART:  RRR.  PMI not displaced or sustained,S1 and S2 within normal limits, no S3, no S4, no clicks, no rubs, II/VI systolic murmur at LUSB ABD:  Flat, positive bowel sounds normal in frequency in pitch, no bruits, no rebound, no guarding, no midline pulsatile mass, no hepatomegaly, no splenomegaly EXT:  2 plus pulses throughout, no edema, no cyanosis no clubbing SKIN:  No rashes no nodules NEURO:  Cranial nerves II through XII grossly intact, motor grossly intact throughout PSYCH:  Cognitively intact, oriented to person place and time    EKG:  EKG is ordered today. The ekg ordered today demonstrates sinus rhythm.  Rate 85 bpm.  LBBB.   LAD.   Recent Labs: 03/19/2015: BUN 15; Creatinine, Ser 0.95; Potassium 4.2; Sodium 143    Lipid Panel    Component Value Date/Time   CHOL 132 11/14/2014 0918   CHOL 221 (H) 09/29/2014 1511   TRIG 66 11/14/2014 0918   HDL 70 11/14/2014 0918   HDL 67 09/29/2014 1511   CHOLHDL 1.9 11/14/2014 0918   VLDL 13 11/14/2014 0918   LDLCALC 49 11/14/2014 0918   LDLCALC 124 (H) 09/29/2014 1511      Wt Readings from Last 3 Encounters:  11/17/15 103.3 kg (227 lb 12.8 oz)  08/05/15 102.4 kg (225 lb 12.8 oz)  06/25/15 100.3 kg (221  lb 1.6 oz)      ASSESSMENT AND PLAN:  # Shortness of breath: I suspect that Stephen Hendricks's shortness of breath may be due to either COPD (105 pack year history) or deconditioning.  His stress test and echo last year were relatively unremarkable. His pulmonary pressures were very mildly elevated which may be a sign that he does have COPD. [place] also frequently [place] to [place] which puts him at risk for developing pulmonary emboli. We will check a CT to rule out pulmonary embolism as well as an echo to evaluate for worsening of his pulmonary hypertension. We will get pulmonary function tests and referred to pulmonary. Does have underlying lung disease.  # Hypertension:  Blood pressure is above goal today. However, he reports symptoms of orthostasis when he was on higher doses of antihypertensives. Therefore we will not change his amlodipine and losartan at this time.  # LBBB: Stable. Lexiscan Myoview was negative last year.  Current medicines are reviewed at length with the patient today.  The patient does not have concerns regarding medicines.  The following changes have been made:  no change  Labs/ tests ordered today include:   Orders Placed This Encounter  Procedures  . EKG 12-Lead  . ECHOCARDIOGRAM COMPLETE  . Pulmonary function test     Disposition:   FU with Gayle Martinez C. Duke Salviaandolph, MD, [facility] in 1 month.    This note was written with the assistance of  speech recognition software.  Please excuse any transcriptional errors.  Signed, Ritesh Opara C. Duke Salviaandolph, MD, [facility]  11/17/2015 1:18 PM    [facility]

## 2015-11-17 ENCOUNTER — Encounter: Payer: Self-pay | Admitting: Cardiovascular Disease

## 2015-11-17 ENCOUNTER — Other Ambulatory Visit (HOSPITAL_COMMUNITY): Payer: Self-pay | Admitting: Respiratory Therapy

## 2015-11-17 ENCOUNTER — Ambulatory Visit (INDEPENDENT_AMBULATORY_CARE_PROVIDER_SITE_OTHER): Payer: Medicaid Other | Admitting: Cardiovascular Disease

## 2015-11-17 VITALS — BP 149/79 | HR 85 | Ht 74.0 in | Wt 227.8 lb

## 2015-11-17 DIAGNOSIS — I10 Essential (primary) hypertension: Secondary | ICD-10-CM | POA: Diagnosis not present

## 2015-11-17 DIAGNOSIS — I272 Pulmonary hypertension, unspecified: Secondary | ICD-10-CM

## 2015-11-17 DIAGNOSIS — I447 Left bundle-branch block, unspecified: Secondary | ICD-10-CM | POA: Diagnosis not present

## 2015-11-17 DIAGNOSIS — R0602 Shortness of breath: Secondary | ICD-10-CM | POA: Diagnosis not present

## 2015-11-17 NOTE — Patient Instructions (Addendum)
Medication Instructions:  Your physician recommends that you continue on your current medications as directed. Please refer to the Current Medication list given to you today.  Labwork: NONE  Testing/Procedures: Your physician has requested that you have an echocardiogram. Echocardiography is a painless test that uses sound waves to create images of your heart. It provides your doctor with information about the size and shape of your heart and how well your heart's chambers and valves are working. This procedure takes approximately one hour. There are no restrictions for this procedure. [facility] [address]  Your physician has recommended that you have a pulmonary function test. Pulmonary Function Tests are a group of tests that measure how well air moves in and out of your lungs.  Follow-Up: Your physician recommends that you schedule a follow-up appointment in: 1 MONTH OV   Any Other Special Instructions Will Be Listed Below (If Applicable).  Low-Sodium Eating Plan Sodium raises blood pressure and causes water to be held in the body. Getting less sodium from food will help lower your blood pressure, reduce any swelling, and protect your heart, liver, and kidneys. We get sodium by adding salt (sodium chloride) to food. Most of our sodium comes from canned, boxed, and frozen foods. Restaurant foods, fast foods, and pizza are also very high in sodium. Even if you take medicine to lower your blood pressure or to reduce fluid in your body, getting less sodium from your food is important. What is my plan? Most people should limit their sodium intake to 2,300 mg a day. Your health care provider recommends that you limit your sodium intake to __________ a day. What do I need to know about this eating plan? For the low-sodium eating plan, you will follow these general guidelines:  Choose foods with a % Daily Value for sodium of less than 5% (as listed on the food label).  Use  salt-free seasonings or herbs instead of table salt or sea salt.  Check with your health care provider or pharmacist before using salt substitutes.  Eat fresh foods.  Eat more vegetables and fruits.  Limit canned vegetables. If you do use them, rinse them well to decrease the sodium.  Limit cheese to 1 oz (28 g) per day.  Eat lower-sodium products, often labeled as "lower sodium" or "no salt added."  Avoid foods that contain monosodium glutamate (MSG). MSG is sometimes added to [place] food and some canned foods.  Check food labels (Nutrition Facts labels) on foods to learn how much sodium is in one serving.  Eat more home-cooked food and less restaurant, buffet, and fast food.  When eating at a restaurant, ask that your food be prepared with less salt, or no salt if possible. How do I read food labels for sodium information? The Nutrition Facts label lists the amount of sodium in one serving of the food. If you eat more than one serving, you must multiply the listed amount of sodium by the number of servings. Food labels may also identify foods as:  Sodium free-Less than 5 mg in a serving.  Very low sodium-35 mg or less in a serving.  Low sodium-140 mg or less in a serving.  Light in sodium-50% less sodium in a serving. For example, if a food that usually has 300 mg of sodium is changed to become light in sodium, it will have 150 mg of sodium.  Reduced sodium-25% less sodium in a serving. For example, if a food that  usually has 400 mg of sodium is changed to reduced sodium, it will have 300 mg of sodium. What foods can I eat? Grains  Low-sodium cereals, including oats, puffed wheat and rice, and shredded wheat cereals. Low-sodium crackers. Unsalted rice and pasta. Lower-sodium bread. Vegetables  Frozen or fresh vegetables. Low-sodium or reduced-sodium canned vegetables. Low-sodium or reduced-sodium tomato sauce and paste. Low-sodium or reduced-sodium tomato and vegetable  juices. Fruits  Fresh, frozen, and canned fruit. Fruit juice. Meat and Other Protein Products  Low-sodium canned tuna and salmon. Fresh or frozen meat, poultry, seafood, and fish. Lamb. Unsalted nuts. Dried beans, peas, and lentils without added salt. Unsalted canned beans. Homemade soups without salt. Eggs. Dairy  Milk. Soy milk. Ricotta cheese. Low-sodium or reduced-sodium cheeses. Yogurt. Condiments  Fresh and dried herbs and spices. Salt-free seasonings. Onion and garlic powders. Low-sodium varieties of mustard and ketchup. Fresh or refrigerated horseradish. Lemon juice. Fats and Oils  Reduced-sodium salad dressings. Unsalted butter. Other  Unsalted popcorn and pretzels. The items listed above may not be a complete list of recommended foods or beverages. Contact your dietitian for more options.  What foods are not recommended? Grains  Instant hot cereals. Bread stuffing, pancake, and biscuit mixes. Croutons. Seasoned rice or pasta mixes. Noodle soup cups. Boxed or frozen macaroni and cheese. Self-rising flour. Regular salted crackers. Vegetables  Regular canned vegetables. Regular canned tomato sauce and paste. Regular tomato and vegetable juices. Frozen vegetables in sauces. Salted [place] fries. Olives. Rosita FirePickles. Relishes. Sauerkraut. Salsa. Meat and Other Protein Products  Salted, canned, smoked, spiced, or pickled meats, seafood, or fish. Bacon, ham, sausage, hot dogs, corned beef, chipped beef, and packaged luncheon meats. Salt pork. Jerky. Pickled herring. Anchovies, regular canned tuna, and sardines. Salted nuts. Dairy  Processed cheese and cheese spreads. Cheese curds. Blue cheese and cottage cheese. Buttermilk. Condiments  Onion and garlic salt, seasoned salt, table salt, and sea salt. Canned and packaged gravies. Worcestershire sauce. Tartar sauce. Barbecue sauce. Teriyaki sauce. Soy sauce, including reduced sodium. Steak sauce. Fish sauce. Oyster sauce. Cocktail sauce.  Horseradish that you find on the shelf. Regular ketchup and mustard. Meat flavorings and tenderizers. Bouillon cubes. Hot sauce. Tabasco sauce. Marinades. Taco seasonings. Relishes. Fats and Oils  Regular salad dressings. Salted butter. Margarine. Ghee. Bacon fat. Other  Potato and tortilla chips. Corn chips and puffs. Salted popcorn and pretzels. Canned or dried soups. Pizza. Frozen entrees and pot pies. The items listed above may not be a complete list of foods and beverages to avoid. Contact your dietitian for more information.  This information is not intended to replace advice given to you by your health care provider. Make sure you discuss any questions you have with your health care provider. Document Released: 06/11/2001 Document Revised: 05/28/2015 Document Reviewed: 10/24/2012 Elsevier Interactive Patient Education  2017 [employer]

## 2015-11-17 NOTE — Addendum Note (Signed)
Addended by: Regis BillPRATT, Leary Mcnulty B on: 11/17/2015 03:11 PM   Modules accepted: Orders

## 2015-11-18 ENCOUNTER — Telehealth: Payer: Self-pay | Admitting: *Deleted

## 2015-11-18 ENCOUNTER — Other Ambulatory Visit: Payer: Self-pay | Admitting: Cardiovascular Disease

## 2015-11-18 ENCOUNTER — Telehealth: Payer: Self-pay | Admitting: Cardiovascular Disease

## 2015-11-18 ENCOUNTER — Other Ambulatory Visit: Payer: Self-pay | Admitting: *Deleted

## 2015-11-18 ENCOUNTER — Ambulatory Visit (INDEPENDENT_AMBULATORY_CARE_PROVIDER_SITE_OTHER)
Admission: RE | Admit: 2015-11-18 | Discharge: 2015-11-18 | Disposition: A | Discharge status: Home / Self Care | Payer: Medicaid Other | Source: Ambulatory Visit | Attending: Cardiovascular Disease | Admitting: Cardiovascular Disease

## 2015-11-18 DIAGNOSIS — R0602 Shortness of breath: Secondary | ICD-10-CM

## 2015-11-18 DIAGNOSIS — J439 Emphysema, unspecified: Secondary | ICD-10-CM

## 2015-11-18 DIAGNOSIS — Z79899 Other long term (current) drug therapy: Secondary | ICD-10-CM

## 2015-11-18 LAB — BASIC METABOLIC PANEL
BUN: 11 mg/dL (ref 7–25)
CALCIUM: 9.4 mg/dL (ref 8.6–10.3)
CO2: 30 mmol/L (ref 20–31)
CREATININE: 0.87 mg/dL (ref 0.70–1.18)
Chloride: 101 mmol/L (ref 98–110)
GLUCOSE: 102 mg/dL — AB (ref 65–99)
Potassium: 4.2 mmol/L (ref 3.5–5.3)
Sodium: 138 mmol/L (ref 135–146)

## 2015-11-18 MED ORDER — APIXABAN 5 MG PO TABS: ORAL_TABLET | ORAL | 0 refills | Status: DC

## 2015-11-18 MED ORDER — IOPAMIDOL (ISOVUE-370) INJECTION 76%
80.0000 mL | Freq: Once | INTRAVENOUS | Status: AC | PRN
  Administered 2015-11-18: 80 mL via INTRAVENOUS

## 2015-11-18 NOTE — Telephone Encounter (Signed)
Patient getting CT later today so labs placed in Epic to be done STAT

## 2015-11-18 NOTE — Telephone Encounter (Signed)
-----   Message from Chilton Siiffany Chapin, MD sent at 11/18/2015  4:39 PM EST ----- Called patient to inform of these results and asked him to call me back.  We will start Eliquis 10mg  bid x7 days then 5mg  bid x3 months.  Follow up in 2 weeks.  We will cancel the echo.  Please refer to pulmonary for emphysema.

## 2015-11-18 NOTE — Telephone Encounter (Signed)
Spoke with son, results given by Dr Duke Salviaandolph Advised son to stop ASA and scheduled follow up ov with Dr Duke Salviaandolph

## 2015-11-18 NOTE — Telephone Encounter (Signed)
Labs reviewed and Stephen ArnoldStacy in CT aware BMET resulted

## 2015-11-20 ENCOUNTER — Encounter (HOSPITAL_COMMUNITY): Payer: Medicaid Other

## 2015-11-23 ENCOUNTER — Encounter: Payer: Self-pay | Admitting: Cardiovascular Disease

## 2015-11-23 ENCOUNTER — Encounter (INDEPENDENT_AMBULATORY_CARE_PROVIDER_SITE_OTHER): Payer: Medicaid Other | Admitting: Cardiovascular Disease

## 2015-12-02 ENCOUNTER — Ambulatory Visit: Payer: Medicaid Other | Admitting: Cardiovascular Disease

## 2015-12-03 ENCOUNTER — Ambulatory Visit (INDEPENDENT_AMBULATORY_CARE_PROVIDER_SITE_OTHER): Payer: Medicaid Other | Admitting: Cardiovascular Disease

## 2015-12-03 ENCOUNTER — Encounter: Payer: Self-pay | Admitting: Cardiovascular Disease

## 2015-12-03 VITALS — BP 148/86 | HR 77 | Ht 74.0 in | Wt 230.6 lb

## 2015-12-03 DIAGNOSIS — I2699 Other pulmonary embolism without acute cor pulmonale: Secondary | ICD-10-CM | POA: Diagnosis not present

## 2015-12-03 DIAGNOSIS — I447 Left bundle-branch block, unspecified: Secondary | ICD-10-CM

## 2015-12-03 DIAGNOSIS — I10 Essential (primary) hypertension: Secondary | ICD-10-CM | POA: Diagnosis not present

## 2015-12-03 HISTORY — DX: Other pulmonary embolism without acute cor pulmonale: I26.99

## 2015-12-03 MED ORDER — AMLODIPINE BESYLATE 10 MG PO TABS: 10.0000 mg | ORAL_TABLET | Freq: Every day | ORAL | 3 refills | Status: DC

## 2015-12-03 MED ORDER — APIXABAN 5 MG PO TABS: 5.0000 mg | ORAL_TABLET | Freq: Two times a day (BID) | ORAL | 1 refills | Status: DC

## 2015-12-03 MED ORDER — LOSARTAN POTASSIUM 25 MG PO TABS: 25.0000 mg | ORAL_TABLET | Freq: Every day | ORAL | 3 refills | Status: AC

## 2015-12-03 NOTE — Patient Instructions (Addendum)
Medication Instructions:  INCREASE YOUR AMLODIPINE TO 10 MG DAILY   Labwork: NONE  Testing/Procedures: NONE  Follow-Up: Your physician recommends that you schedule a follow-up appointment in: 3 MONTH OV  If you need a refill on your cardiac medications before your next appointment, please call your pharmacy.

## 2015-12-03 NOTE — Progress Notes (Signed)
Cardiology Office Note   Date:  12/03/2015   ID:  Stephen Hendricks, DOB 1940-07-21, MRN [id number]  PCP:  Darreld Mclean, MD  Cardiologist:   Chilton Si, MD   Chief Complaint  Patient presents with  . Follow-up    2 weeks; BP elevated past two days.  . Shortness of Breath    when walking up the stairs       History of Present Illness: Stephen Hendricks is a 75 y.o. male with hypertension, hyperlipidemia, pulmonary embolism (11/2015), and LBBB who presents for follow up.  He saw Wilburt Finlay 10/23/14 for evaluation of LBBB.  At the time he was feeling well.  He had an echo 10/17/14 that revealed LVEF 55-60% and grade 1 diastolic dysfunction. Pulmonary pressures were at the upper limit of normal at 38 mmHg. He also had a Lexiscan Myoview10/14/17 with LVEF 52% and no evidence of ischemia. He was seen 11/17/15 with a complaint of shortness of breath with minimal exertion.  Given his frequent intercontinental travel, he was referred for a CT that was positive for acute bilateral lower lobe PE.  He was started on Eliquis and presents today for follow up.  Since his last appointment Stephen Hendricks has been feeling better.  His shortness of breath has improved, though he still notes dyspnea with extreme exertin.  He denies chest pain, lower extremity edema, orthopnea, or PND. His last intercontinental trip was to [place] nearly 6 months ago. Since that time he's had several long car rides and was nearly immobilized after several eye surgeries that required him to lay flat for long periods of time.   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  He is from [place] and frequently travels there for work.  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  The last time he had malaria was nearly 7 years ago.     Past Medical History:  Diagnosis Date  . Acute pulmonary embolism (HCC) 12/03/2015   Bilateral lower lobe pulmonary emboli 11/2015.  Marland Kitchen BPH (benign prostatic hyperplasia)   .  Cataract of right eye   . Frozen shoulder   . HTN (hypertension)   . Hx of prostatitis 3.20.16   WITH E.COLI  . LBBB (left bundle branch block)   . Leishmaniasis    [place]ENT [place]  . [place]  . Oral candidiasis    [place] TO [place]  . Right tennis elbow   . SOB (shortness of breath)     Past Surgical History:  Procedure Laterality Date  . CORNEAL LACERATION REPAIR Left 10/10/2015   Procedure: WOUND REVISION WITH REFORMATION OF ANTERIOR CHAMBER;  Surgeon: Carmela Rima, MD;  Location: [facility] OR;  Service: Ophthalmology;  Laterality: Left;  Marland Kitchen GAS INSERTION Left 10/09/2015   Procedure: INSERTION OF SF6 GAS;  Surgeon: Carmela Rima, MD;  Location: [facility] OR;  Service: Ophthalmology;  Laterality: Left;  . PARS PLANA VITRECTOMY Left 10/09/2015   Procedure: 25 GAUGE VITRECTOMY WITH ENDOLASER, removal of cortical material from anterior chamber, removal of cortical material from posterior chamber, repair of retinal tears and localized detachment, closure of corneal wound (Left;  Surgeon: Carmela Rima, MD;  Location: [facility] OR;  Service: Ophthalmology;  Laterality: Left;  . 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 TREATMENT FISTULA ANAL  1980   pt really couldn't remember     Current Outpatient Prescriptions  Medication Sig Dispense Refill  . amLODipine (NORVASC) 10 MG tablet Take 1 tablet (10 mg  total) by mouth daily. 90 tablet 3  . apixaban (ELIQUIS) 5 MG TABS tablet Take 1 tablet (5 mg total) by mouth 2 (two) times daily. 180 tablet 1  . famotidine (PEPCID) 20 MG tablet TAKE 1 TABLET (20 MG TOTAL) BY MOUTH 2 (TWO) TIMES DAILY. 300 tablet 0  . finasteride (PROSCAR) 5 MG tablet Take 1 tablet (5 mg total) by mouth daily. 150 tablet 0  . gabapentin (NEURONTIN) 100 MG capsule Take 900 mg by mouth at bedtime.     Marland Kitchen. losartan (COZAAR) 25 MG tablet Take 1 tablet (25 mg total) by mouth daily. 90 tablet 3  . sodium chloride (OCEAN) 0.65 % nasal spray Place 2 sprays into the nose daily as  needed for congestion.     . tamsulosin (FLOMAX) 0.4 MG CAPS capsule Take 2 capsules (0.8 mg total) by mouth daily after supper. 300 capsule 0  . Travoprost, BAK Free, (TRAVATAN) 0.004 % SOLN ophthalmic solution Place 1 drop into the right eye at bedtime.     No current facility-administered medications for this visit.     Allergies:   Patient has no known allergies.    Social History:  The patient  reports that he quit smoking about 24 years ago. He started smoking about 59 years ago. He has never used smokeless tobacco. He reports that he drinks about 1.8 - 2.4 oz of alcohol per week . He reports that he does not use drugs.   Family History:  The patient's family history includes Cancer in his daughter; Cardiomyopathy in his mother; Diabetes in his brother; Other in his father.    ROS:  Please see the history of present illness.   Otherwise, review of systems are positive for R knee pain.   All other systems are reviewed and negative.    PHYSICAL EXAM: VS:  BP (!) 148/86   Pulse 77   Ht 6\' 2"  (1.88 m)   Wt 104.6 kg (230 lb 9.6 oz)   BMI 29.61 kg/m  , BMI Body mass index is 29.61 kg/m. GENERAL:  Well appearing HEENT:  Pupils equal round and reactive, fundi not visualized, oral mucosa unremarkable NECK:  No jugular venous distention, waveform within normal limits, carotid upstroke brisk and symmetric, no bruits LYMPHATICS:  No cervical adenopathy LUNGS:  Clear to auscultation bilaterally HEART:  RRR.  PMI not displaced or sustained,S1 and S2 within normal limits, no S3, no S4, no clicks, no rubs, II/VI systolic murmur at LUSB ABD:  Flat, positive bowel sounds normal in frequency in pitch, no bruits, no rebound, no guarding, no midline pulsatile mass, no hepatomegaly, no splenomegaly EXT:  2 plus pulses throughout, no edema, no cyanosis no clubbing SKIN:  No rashes no nodules NEURO:  Cranial nerves II through XII grossly intact, motor grossly intact throughout PSYCH:  Cognitively  intact, oriented to person place and time   EKG:  EKG is not ordered today. The ekg ordered 11/17/15 demonstrates sinus rhythm.  Rate 85 bpm.  LBBB.  LAD.   Recent Labs: 11/18/2015: BUN 11; Creat 0.87; Potassium 4.2; Sodium 138    Lipid Panel    Component Value Date/Time   CHOL 132 11/14/2014 0918   CHOL 221 (H) 09/29/2014 1511   TRIG 66 11/14/2014 0918   HDL 70 11/14/2014 0918   HDL 67 09/29/2014 1511   CHOLHDL 1.9 11/14/2014 0918   VLDL 13 11/14/2014 0918   LDLCALC 49 11/14/2014 0918   LDLCALC 124 (H) 09/29/2014 1511  Wt Readings from Last 3 Encounters:  12/03/15 104.6 kg (230 lb 9.6 oz)  11/23/15 103.8 kg (228 lb 12.8 oz)  11/17/15 103.3 kg (227 lb 12.8 oz)      ASSESSMENT AND PLAN:  # Pulmonary embolism: StephenHendricks sas noted to have bilateral lower lobe pulmonary emboli 11/2015.  He is doing well on Eliquis.  We will plan for 3 months of therapy. I also encouraged him to walk while traveling for long distances and to wear compression stockings. He expressed understanding. We will consider whether to pursue hematology testing after 3 months. He does not have any evidence of heart failure on exam and is improving clinically so we will defer a repeat echocardiogram at this time.   # Hypertension:  Blood pressure remains above goal today.   We will increase amlodipine to 10 mg and continue losartan 25 mg daily.   # LBBB: Stable. Lexiscan Myoview was negative last year.  Current medicines are reviewed at length with the patient today.  The patient does not have concerns regarding medicines.  The following changes have been made:  no change  Labs/ tests ordered today include:   No orders of the defined types were placed in this encounter.    Disposition:   FU with Stephen Loyal C. Duke Salviaandolph, MD, [facility]FACC in 3 months.    This note was written with the assistance of speech recognition software.  Please excuse any transcriptional errors.  Signed, Vanellope Passmore C. Duke Salviaandolph,  MD, [facility]  12/03/2015 1:54 PM    [facility]

## 2015-12-08 ENCOUNTER — Other Ambulatory Visit (HOSPITAL_COMMUNITY): Payer: Medicaid Other

## 2015-12-14 ENCOUNTER — Other Ambulatory Visit: Payer: Self-pay | Admitting: Cardiovascular Disease

## 2015-12-14 NOTE — Telephone Encounter (Signed)
Please review for refill. Thanks!  

## 2015-12-15 ENCOUNTER — Ambulatory Visit (INDEPENDENT_AMBULATORY_CARE_PROVIDER_SITE_OTHER): Payer: Medicaid Other | Admitting: Pulmonary Disease

## 2015-12-15 ENCOUNTER — Encounter: Payer: Self-pay | Admitting: Pulmonary Disease

## 2015-12-15 VITALS — BP 122/74 | HR 80 | Ht 74.0 in | Wt 228.2 lb

## 2015-12-15 DIAGNOSIS — R0602 Shortness of breath: Secondary | ICD-10-CM | POA: Diagnosis not present

## 2015-12-15 NOTE — Patient Instructions (Signed)
We will schedule for pulmonary function tests. Return to clinic after lung function tests for review and follow up

## 2015-12-15 NOTE — Progress Notes (Signed)
Stephen Hendricks    [id number]    24-Oct-1940  Primary Care Physician:Vishal Allena KatzPatel, MD  Referring Physician: Darreld McleanVishal Patel, MD [address] [place], [place] [id number]  Chief complaint:  Consult for evaluation of emphysema.  HPI: Stephen Hendricks is a 75 year old with past medical history of hypertension, left bundle branch block, recent diagnosis of pulmonary embolism in Nov 2017. He has been evaluated by cardiology for dyspnea on exertion and had a CT scan that showed acute bilateral lower lobe PE and started on Eliquis. He has history of travel to [place] and [place]. He's had multiple recent eye surgeries for cataract and retinal injury. He also reports being immobile with recent car rides.  He reports that his dyspnea has improved on anticoagulation but he still has some symptoms persisting. He is a retired Surveyor, quantitymeritus Professor of Ecology. [place] still [place] to [place]. He's had multiple episodes of malaria and leishmania infections in the past. He has about 150-pack-year smoking history, quit in 1993.  His chief complaint in office today is mild dyspnea on exertion. He denies any dyspnea at rest, cough, sputum production, wheezing, chest pain, palpitations.  Outpatient Encounter Prescriptions as of 12/15/2015  Medication Sig  . amLODipine (NORVASC) 10 MG tablet Take 1 tablet (10 mg total) by mouth daily.  Marland Kitchen. apixaban (ELIQUIS) 5 MG TABS tablet Take 1 tablet (5 mg total) by mouth 2 (two) times daily.  Marland Kitchen. apixaban (ELIQUIS) 5 MG TABS tablet Take 1 tablet (5 mg total) by mouth 2 (two) times daily.  . famotidine (PEPCID) 20 MG tablet TAKE 1 TABLET (20 MG TOTAL) BY MOUTH 2 (TWO) TIMES DAILY.  . finasteride (PROSCAR) 5 MG tablet Take 1 tablet (5 mg total) by mouth daily.  Marland Kitchen. gabapentin (NEURONTIN) 100 MG capsule Take 900 mg by mouth at bedtime.   Marland Kitchen. losartan (COZAAR) 25 MG tablet Take 1 tablet (25 mg total) by mouth daily.  . sodium chloride (OCEAN) 0.65 % nasal spray Place 2  sprays into the nose daily as needed for congestion.   . tamsulosin (FLOMAX) 0.4 MG CAPS capsule Take 2 capsules (0.8 mg total) by mouth daily after supper.  . Travoprost, BAK Free, (TRAVATAN) 0.004 % SOLN ophthalmic solution Place 1 drop into the right eye at bedtime.   No facility-administered encounter medications on file as of 12/15/2015.     Allergies as of 12/15/2015  . (No Known Allergies)    Past Medical History:  Diagnosis Date  . Acute pulmonary embolism (HCC) 12/03/2015   Bilateral lower lobe pulmonary emboli 11/2015.  Marland Kitchen. BPH (benign prostatic hyperplasia)   . Cataract of right eye   . Frozen shoulder   . HTN (hypertension)   . Hx of prostatitis 3.20.16   WITH E.COLI  . LBBB (left bundle branch block)   . Leishmaniasis    [place]  . [place]    [place]  . Oral candidiasis    [place] TO [place]  . Right tennis elbow   . SOB (shortness of breath)     Past Surgical History:  Procedure Laterality Date  . CORNEAL LACERATION REPAIR Left 10/10/2015   Procedure: WOUND REVISION WITH REFORMATION OF ANTERIOR CHAMBER;  Surgeon: Carmela RimaNarendra Patel, MD;  [facility];  Service: Ophthalmology;  Laterality: Left;  Marland Kitchen. GAS INSERTION Left 10/09/2015   Procedure: INSERTION OF SF6 GAS;  Surgeon: Carmela RimaNarendra Patel, MD;  [facility];  Service: Ophthalmology;  Laterality: Left;  . PARS PLANA  VITRECTOMY Left 10/09/2015   Procedure: 25 GAUGE VITRECTOMY WITH ENDOLASER, removal of cortical material from anterior chamber, removal of cortical material from posterior chamber, repair of retinal tears and localized detachment, closure of corneal wound (Left;  Surgeon: Carmela RimaNarendra Patel, MD;  [facility];  Service: Ophthalmology;  Laterality: Left;  . 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 TREATMENT FISTULA ANAL  1980   pt really couldn't remember    Family History  Problem Relation Age of Onset  . Cardiomyopathy Mother   . Other Father     PROSTATE DISEASE  . Cancer Daughter      BREAST  . Diabetes Brother   . Heart attack Neg Hx   . Hypertension Neg Hx   . Stroke Neg Hx     Social History   Social History  . Marital status: Married    Spouse name: N/A  . Number of children: N/A  . Years of education: N/A   Occupational History  . Not on file.   Social History Main Topics  . Smoking status: Former Smoker    Start date: 01/04/1956    Quit date: 01/04/1991  . Smokeless tobacco: Never Used  . Alcohol use 1.8 - 2.4 oz/week    3 - 4 Standard drinks or equivalent per week     Comment: ALSO DRINKS BEER  . Drug use: No  . Sexual activity: Not on file   Other Topics Concern  . Not on file   Social History Narrative  . No narrative on file   Review of systems: Review of Systems  Constitutional: Negative for fever and chills.  HENT: Negative.   Eyes: Negative for blurred vision.  Respiratory: as per HPI  Cardiovascular: Negative for chest pain and palpitations.  Gastrointestinal: Negative for vomiting, diarrhea, blood per rectum. Genitourinary: Negative for dysuria, urgency, frequency and hematuria.  Musculoskeletal: Negative for myalgias, back pain and joint pain.  Skin: Negative for itching and rash.  Neurological: Negative for dizziness, tremors, focal weakness, seizures and loss of consciousness.  Endo/Heme/Allergies: Negative for environmental allergies.  Psychiatric/Behavioral: Negative for depression, suicidal ideas and hallucinations.  All other systems reviewed and are negative.  Physical Exam: Blood pressure 122/74, pulse 80, height 6\' 2"  (1.88 m), weight 228 lb 3.2 oz (103.5 kg), SpO2 96 %. Gen:      No acute distress HEENT:  EOMI, sclera anicteric Neck:     No masses; no thyromegaly Lungs:    Clear to auscultation bilaterally; normal respiratory effort CV:         Regular rate and rhythm; no murmurs Abd:      + bowel sounds; soft, non-tender; no palpable masses, no distension Ext:    No edema; adequate peripheral perfusion Skin:       Warm and dry; no rash Neuro: alert and oriented x 3 Psych: normal mood and affect  Data Reviewed: CT scan 11/18/15-bilateral lower lobe segmental PE, no right heart strain. Bibasilar atelectasis, right lower lobe infarct, moderate emphysema. CXR 06/12/15- right middle lobe opacity  All images reviewed  Echo 10/17/14 - Left ventricle: The cavity size was normal. Wall thickness was   normal. Systolic function was normal. The estimated ejection   fraction was in the range of 55% to 60%. Doppler parameters are   consistent with abnormal left ventricular relaxation (grade 1   diastolic dysfunction). - Aortic valve: There was trivial regurgitation. - Pulmonary arteries: PA peak pressure: 38 mm Hg (S).  Assessment:  Evaluation for dyspnea on  exertion He likely has COPD based on his smoking history and review of his CT scan which shows moderate emphysematous changes. He is not very symptomatic except for some mild dyspnea on exertion and I don't believe he needs to be on any inhalers at present. I will get pulmonary function tests for further evaluation.  He'll return to clinic in 1 month for review of PFTs and further follow-up as needed.  Recent bilateral lower lobe pulmonary embolism Stable on eliquis  Plan/Recommendations: - PFTs - Continue eliquis  Chilton Greathouse MD [facility] Pager [phone] 12/15/2015, 3:41 PM  CC: Darreld Mclean, MD

## 2015-12-18 ENCOUNTER — Ambulatory Visit: Payer: Medicaid Other | Admitting: Cardiovascular Disease

## 2016-01-03 NOTE — Progress Notes (Signed)
This encounter was created in error - please disregard.

## 2016-01-13 ENCOUNTER — Encounter: Payer: Medicaid Other | Admitting: Internal Medicine

## 2016-02-10 ENCOUNTER — Encounter: Payer: Medicaid Other | Admitting: Internal Medicine

## 2016-02-16 ENCOUNTER — Telehealth: Payer: Self-pay | Admitting: Cardiovascular Disease

## 2016-02-16 NOTE — Telephone Encounter (Signed)
Pt's son calling regarding blood thinner pt is on, he is travelling abroad and needs to know if he needs to stay on blood thinner after the 3 months that was prescribed? Pls call [phone] son Ahmed

## 2016-02-16 NOTE — Telephone Encounter (Signed)
Spoke with son and he is concerned with his father being abroad and not having follow up he will need the Eliquis for longer than the 3 months. He has no idea when patient will be returning to the states but he will get Eliquis and send to his father if he needs to continue.  Will forward to Dr Duke Salviaandolph for review

## 2016-02-17 NOTE — Telephone Encounter (Signed)
Given that this was his first PE, he technically only needs 3 months of therapy.  However, I think he should take it at least for a couple days before taking international flights.  When he returns I would also like him to see a hematologist to determine if there were any other underlying causes for his PE that would warrant him being on therapy for a longer time.

## 2016-02-18 NOTE — Telephone Encounter (Signed)
Discussed Dr Leonides Sakeandolph's recommendations with patients son, verbalized understanding

## 2016-02-24 ENCOUNTER — Encounter: Payer: Self-pay | Admitting: Internal Medicine

## 2016-02-24 ENCOUNTER — Encounter: Payer: Medicaid Other | Admitting: Internal Medicine

## 2016-02-29 ENCOUNTER — Ambulatory Visit: Payer: Medicaid Other | Admitting: Pulmonary Disease

## 2016-03-02 ENCOUNTER — Ambulatory Visit: Payer: Medicaid Other | Admitting: Cardiovascular Disease

## 2016-03-02 ENCOUNTER — Encounter: Payer: Self-pay | Admitting: *Deleted

## 2016-03-02 NOTE — Progress Notes (Deleted)
Cardiology Office Note   Date:  03/02/2016   ID:  Stephen Hendricks, DOB 03/09/1940, MRN [id number]  PCP:  Darreld McleanVishal Patel, MD  Cardiologist:   Chilton Siiffany [place], MD   No chief complaint on file.     History of Present Illness: Stephen Hendricks is a 76 y.o. male with hypertension, hyperlipidemia, pulmonary embolism (11/2015), and LBBB who presents for follow up.  He saw Wilburt FinlayBryan Hager 10/23/14 for evaluation of LBBB.  At the time he was feeling well.  He had an echo 10/17/14 that revealed LVEF 55-60% and grade 1 diastolic dysfunction. Pulmonary pressures were at the upper limit of normal at 38 mmHg. He also had a Lexiscan Myoview10/14/17 with LVEF 52% and no evidence of ischemia. He was seen 11/17/15 with a complaint of shortness of breath with minimal exertion.  Given his frequent intercontinental travel, he was referred for a CT that was positive for acute bilateral lower lobe PE.  He was started on Eliquis and presents today for follow up.  Since his last appointment Stephen Hendricks has been feeling better.  His shortness of breath has improved, though he still notes dyspnea with extreme exertin.  He denies chest pain, lower extremity edema, orthopnea, or PND. His last intercontinental trip was to [place] nearly 6 months ago. Since that time he's had several long car rides and was nearly immobilized after several eye surgeries that required him to lay flat for long periods of time.   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  He is from [place] and frequently travels there for work.  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  The last time he had malaria was nearly 7 years ago.     Past Medical History:  Diagnosis Date  . Acute pulmonary embolism (HCC) 12/03/2015   Bilateral lower lobe pulmonary emboli 11/2015.  Marland Kitchen. BPH (benign prostatic hyperplasia)   . Cataract of right eye   . Frozen shoulder   . HTN (hypertension)   . Hx of prostatitis 3.20.16   WITH E.COLI  . LBBB  (left bundle branch block)   . Leishmaniasis    WENT TO [place]  . [place]    [place]  . Oral candidiasis    WENT TO [place]  . Right tennis elbow   . SOB (shortness of breath)     Past Surgical History:  Procedure Laterality Date  . CORNEAL LACERATION REPAIR Left 10/10/2015   Procedure: WOUND REVISION WITH REFORMATION OF ANTERIOR CHAMBER;  Surgeon: Carmela RimaNarendra Patel, MD;  [facility];  Service: Ophthalmology;  Laterality: Left;  Marland Kitchen. GAS INSERTION Left 10/09/2015   Procedure: INSERTION OF SF6 GAS;  Surgeon: Carmela RimaNarendra Patel, MD;  [facility];  Service: Ophthalmology;  Laterality: Left;  . PARS PLANA VITRECTOMY Left 10/09/2015   Procedure: 25 GAUGE VITRECTOMY WITH ENDOLASER, removal of cortical material from anterior chamber, removal of cortical material from posterior chamber, repair of retinal tears and localized detachment, closure of corneal wound (Left;  Surgeon: Carmela RimaNarendra Patel, MD;  [facility];  Service: Ophthalmology;  Laterality: Left;  . 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 TREATMENT FISTULA ANAL  1980   pt really couldn't remember     Current Outpatient Prescriptions  Medication Sig Dispense Refill  . amLODipine (NORVASC) 10 MG tablet Take 1 tablet (10 mg total) by mouth daily. 90 tablet 3  . apixaban (ELIQUIS) 5 MG TABS tablet Take 1 tablet (5 mg total) by mouth 2 (two) times daily. 180 tablet  1  . apixaban (ELIQUIS) 5 MG TABS tablet Take 1 tablet (5 mg total) by mouth 2 (two) times daily. 60 tablet 5  . famotidine (PEPCID) 20 MG tablet TAKE 1 TABLET (20 MG TOTAL) BY MOUTH 2 (TWO) TIMES DAILY. 300 tablet 0  . finasteride (PROSCAR) 5 MG tablet Take 1 tablet (5 mg total) by mouth daily. 150 tablet 0  . gabapentin (NEURONTIN) 100 MG capsule Take 900 mg by mouth at bedtime.     Marland Kitchen losartan (COZAAR) 25 MG tablet Take 1 tablet (25 mg total) by mouth daily. 90 tablet 3  . sodium chloride (OCEAN) 0.65 % nasal spray Place 2 sprays into the nose daily as needed for  congestion.     . tamsulosin (FLOMAX) 0.4 MG CAPS capsule Take 2 capsules (0.8 mg total) by mouth daily after supper. 300 capsule 0  . Travoprost, BAK Free, (TRAVATAN) 0.004 % SOLN ophthalmic solution Place 1 drop into the right eye at bedtime.     No current facility-administered medications for this visit.     Allergies:   Patient has no known allergies.    Social History:  The patient  reports that he quit smoking about 25 years ago. He started smoking about 60 years ago. He has never used smokeless tobacco. He reports that he drinks about 1.8 - 2.4 oz of alcohol per week . He reports that he does not use drugs.   Family History:  The patient's family history includes Cancer in his daughter; Cardiomyopathy in his mother; Diabetes in his brother; Other in his father.    ROS:  Please see the history of present illness.   Otherwise, review of systems are positive for R knee pain.   All other systems are reviewed and negative.    PHYSICAL EXAM: VS:  There were no vitals taken for this visit. , BMI There is no height or weight on file to calculate BMI. GENERAL:  Well appearing HEENT:  Pupils equal round and reactive, fundi not visualized, oral mucosa unremarkable NECK:  No jugular venous distention, waveform within normal limits, carotid upstroke brisk and symmetric, no bruits LYMPHATICS:  No cervical adenopathy LUNGS:  Clear to auscultation bilaterally HEART:  RRR.  PMI not displaced or sustained,S1 and S2 within normal limits, no S3, no S4, no clicks, no rubs, II/VI systolic murmur at LUSB ABD:  Flat, positive bowel sounds normal in frequency in pitch, no bruits, no rebound, no guarding, no midline pulsatile mass, no hepatomegaly, no splenomegaly EXT:  2 plus pulses throughout, no edema, no cyanosis no clubbing SKIN:  No rashes no nodules NEURO:  Cranial nerves II through XII grossly intact, motor grossly intact throughout PSYCH:  Cognitively intact, oriented to person place and  time   EKG:  EKG is not ordered today. The ekg ordered 11/17/15 demonstrates sinus rhythm.  Rate 85 bpm.  LBBB.  LAD.   Recent Labs: 11/18/2015: BUN 11; Creat 0.87; Potassium 4.2; Sodium 138    Lipid Panel    Component Value Date/Time   CHOL 132 11/14/2014 0918   CHOL 221 (H) 09/29/2014 1511   TRIG 66 11/14/2014 0918   HDL 70 11/14/2014 0918   HDL 67 09/29/2014 1511   CHOLHDL 1.9 11/14/2014 0918   VLDL 13 11/14/2014 0918   LDLCALC 49 11/14/2014 0918   LDLCALC 124 (H) 09/29/2014 1511      Wt Readings from Last 3 Encounters:  12/15/15 103.5 kg (228 lb 3.2 oz)  12/03/15 104.6 kg (230 lb 9.6  oz)  11/23/15 103.8 kg (228 lb 12.8 oz)      ASSESSMENT AND PLAN:  # Pulmonary embolism: StephenBaumert sas noted to have bilateral lower lobe pulmonary emboli 11/2015.  He is doing well on Eliquis.  We will plan for 3 months of therapy. I also encouraged him to walk while traveling for long distances and to wear compression stockings. He expressed understanding. We will consider whether to pursue hematology testing after 3 months. He does not have any evidence of heart failure on exam and is improving clinically so we will defer a repeat echocardiogram at this time.   # Hypertension:  Blood pressure remains above goal today.   We will increase amlodipine to 10 mg and continue losartan 25 mg daily.   # LBBB: Stable. Lexiscan Myoview was negative last year.  Current medicines are reviewed at length with the patient today.  The patient does not have concerns regarding medicines.  The following changes have been made:  no change  Labs/ tests ordered today include:   No orders of the defined types were placed in this encounter.    Disposition:   FU with Lean Jaeger C. Duke Salvia, MD, [facility] in 3 months.    This note was written with the assistance of speech recognition software.  Please excuse any transcriptional errors.  Signed, Sade Hollon C. Duke Salvia, MD, [facility]  03/02/2016 8:05 AM    [facility]

## 2016-03-03 ENCOUNTER — Encounter: Payer: Self-pay | Admitting: Internal Medicine

## 2017-04-06 IMAGING — US US SCROTUM
1 series · 13 of 25 positions shown · non-contrast
Comparison: None.

CLINICAL DATA: Right testicular swelling for 3 days with fever and
pain.

EXAM:
SCROTAL ULTRASOUND
DOPPLER ULTRASOUND OF THE TESTICLES
TECHNIQUE: Complete ultrasound examination of the testicles, epididymis, and
other scrotal structures was performed. Color and spectral Doppler
ultrasound were also utilized to evaluate blood flow to the
testicles.

[Series 1: us scrotum · 0.06mm/px · 70 acquisitions, 13 frames shown]
[im 1/70]
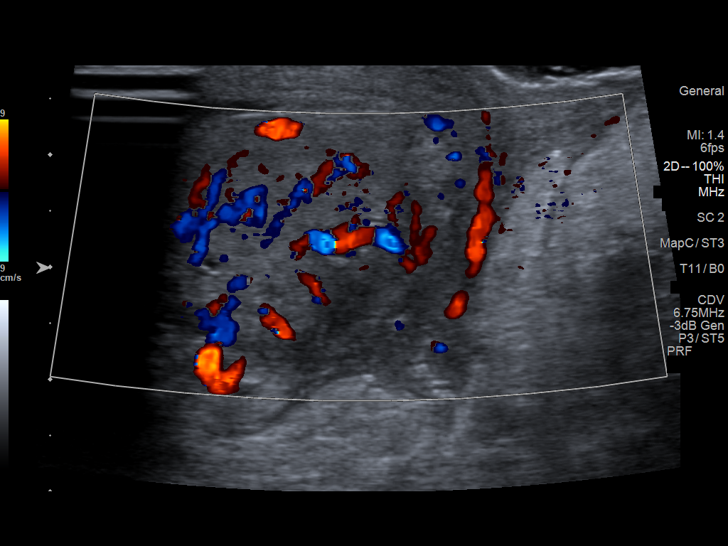
[im 6/70]
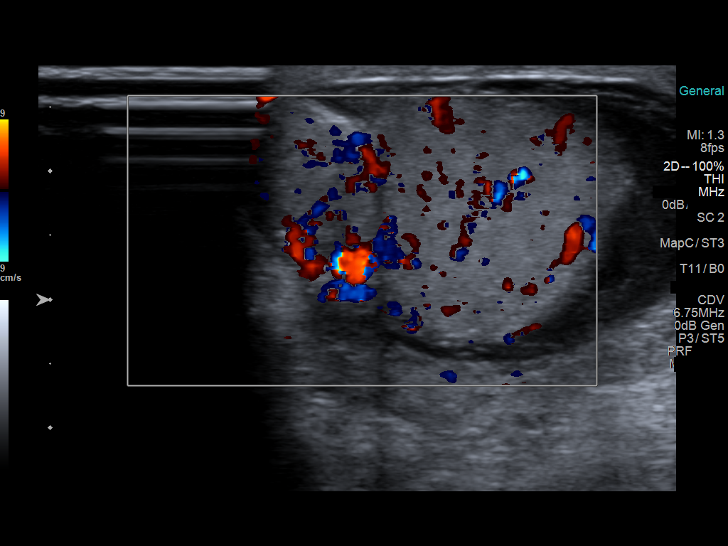
[im 12/70]
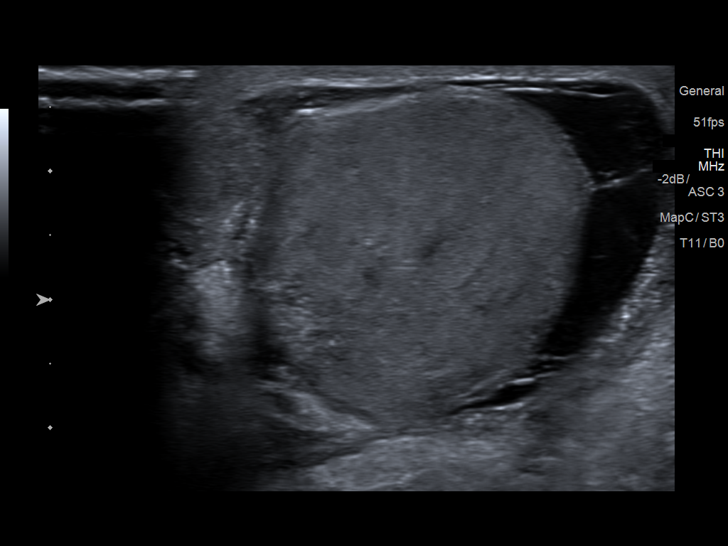
[im 18/70]
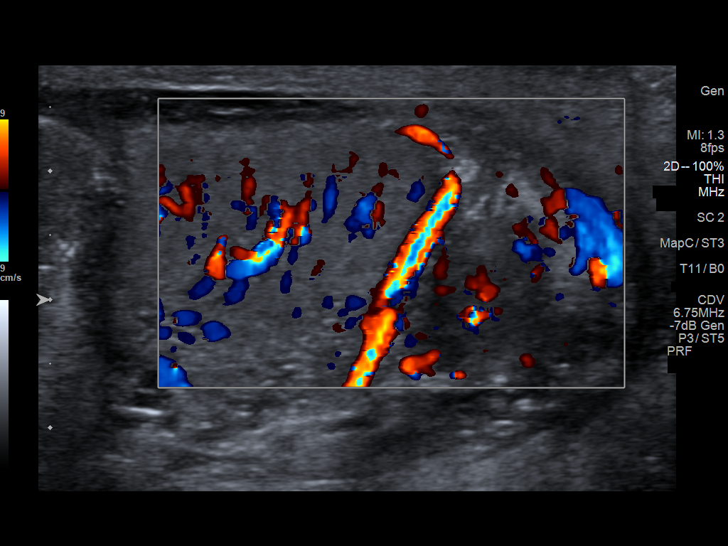
[im 24/70]
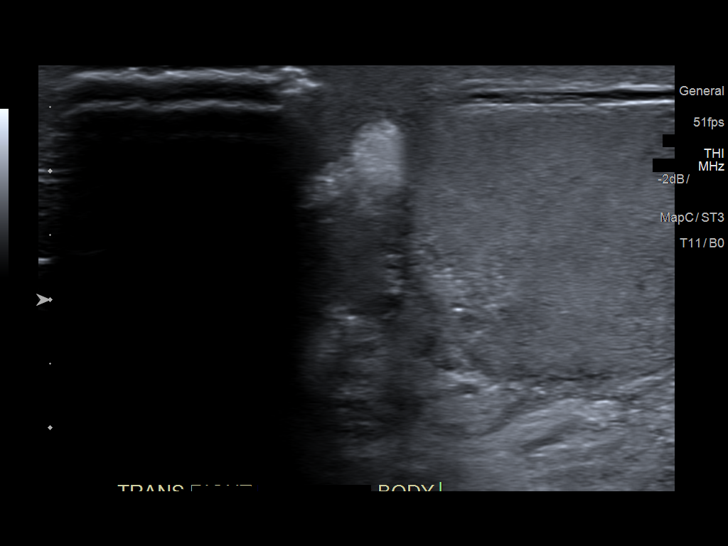
[im 29/70]
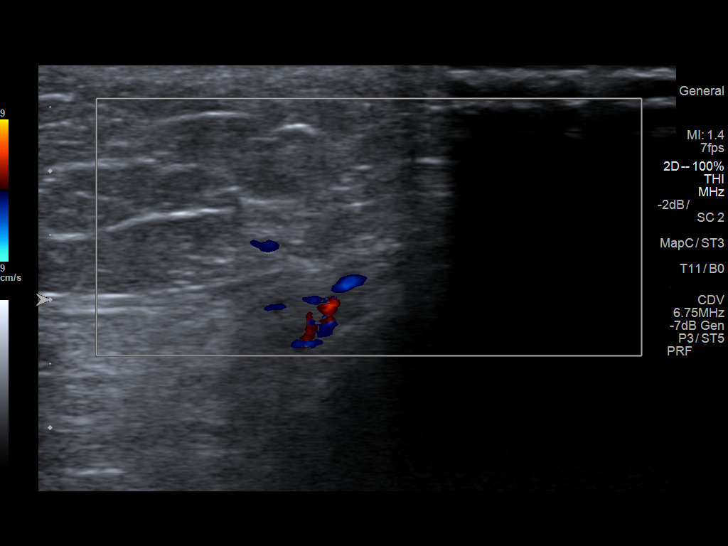
[im 35/70]
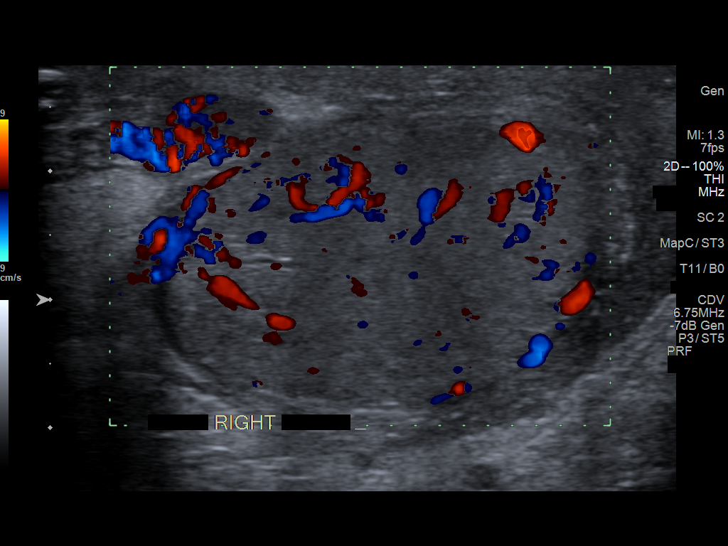
[im 41/70]
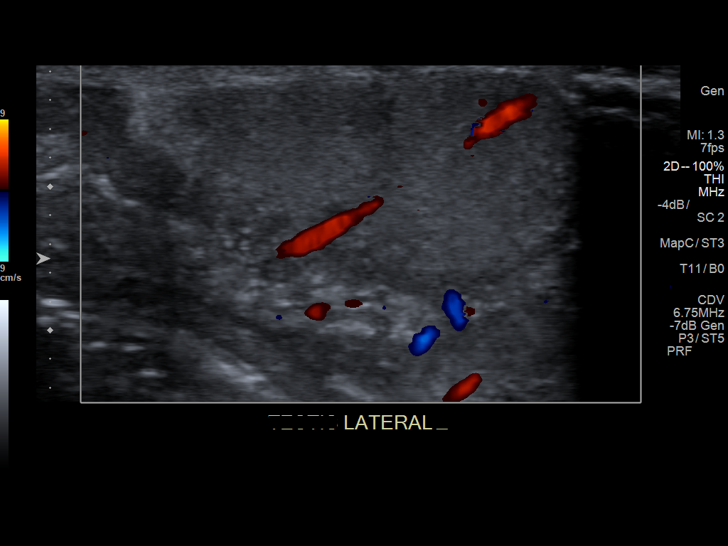
[im 47/70]
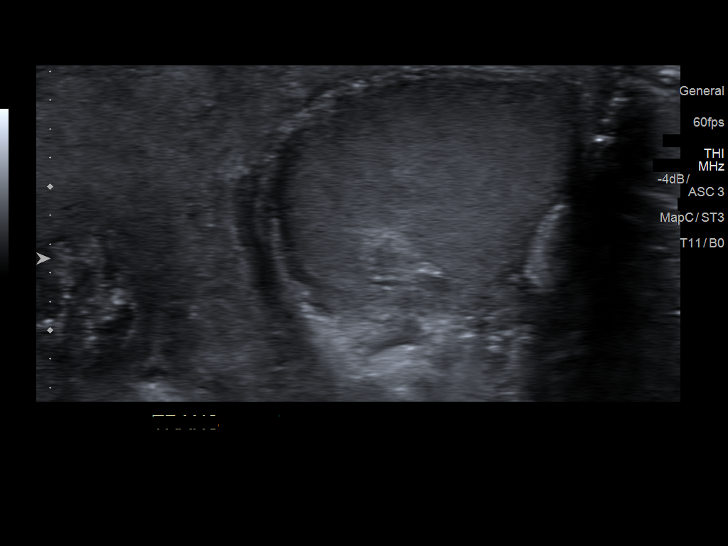
[im 52/70]
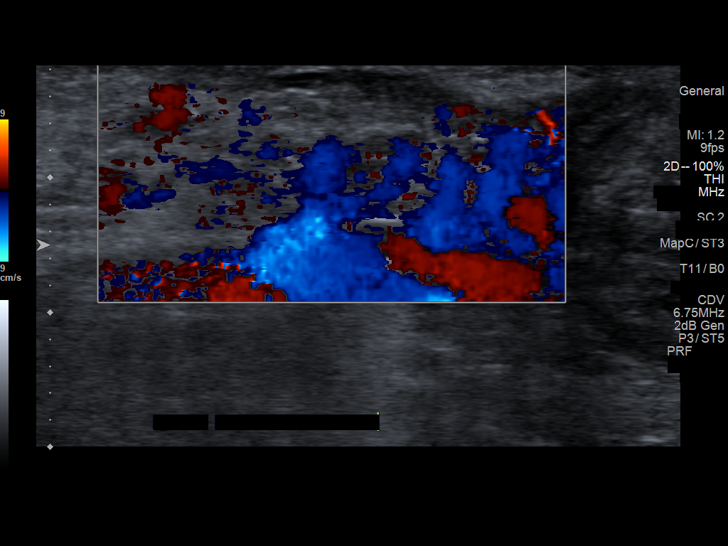
[im 58/70]
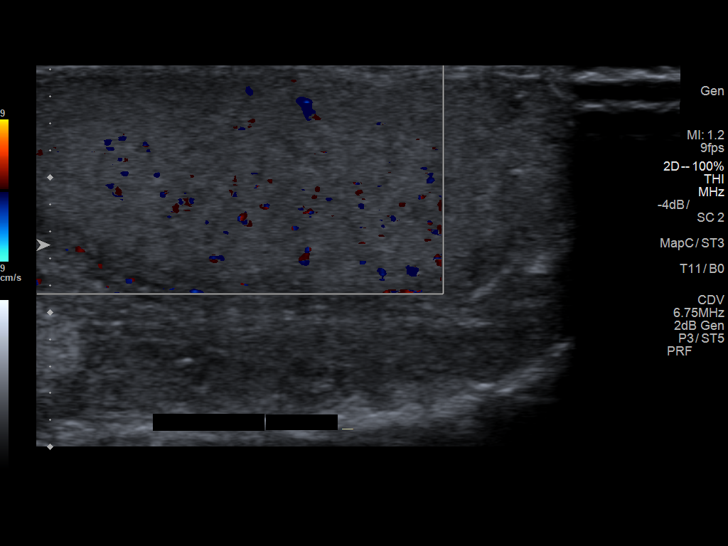
[im 64/70]
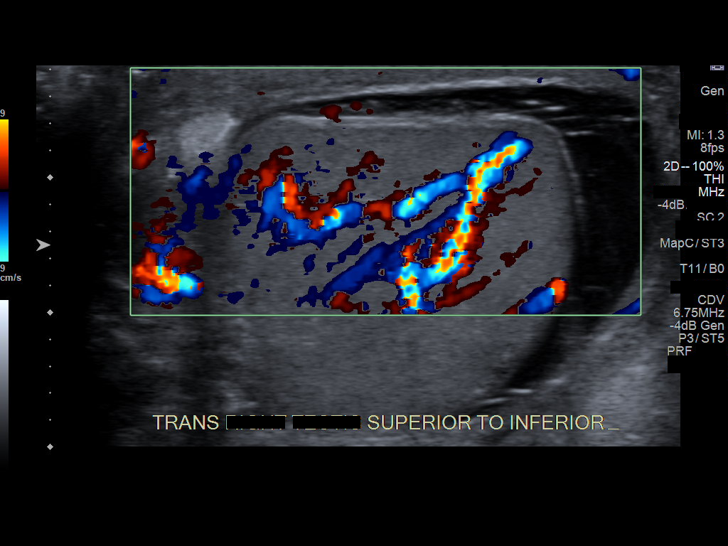
[im 70/70]
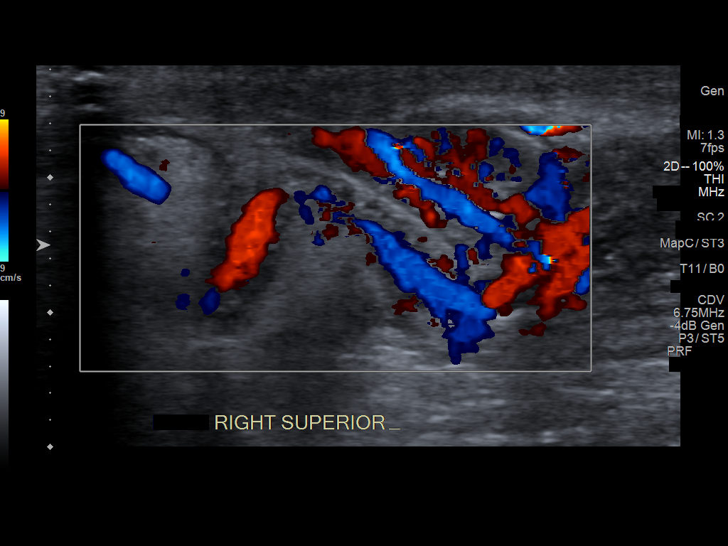

[13 of 25 positions shown; findings below may reference images not displayed]

FINDINGS: Right testicle

Measurements: 2.9 x 2.5 x 2.6 cm. No mass or microlithiasis
visualized. The right testicle is mildly heterogeneous with
increased vascular flow. Scrotal thickening adjacent to the right
testicle.

Left testicle

Measurements: 3.7 x 1.8 x 2.2 cm. No mass or microlithiasis
visualized.

Right epididymis: The right epididymis is enlarged and heterogeneous
with increased vascular flow. Appears to be an epididymal appendix
measuring up to 0.8 cm.

Left epididymis:  Normal in size and appearance.

Hydrocele:  There is small complex right hydrocele.

Varicocele:  Bilateral varicoceles, left side greater than right.

Pulsed Doppler interrogation of both testes demonstrates normal low
resistance arterial and venous waveforms bilaterally.
IMPRESSION: The right epididymis is enlarged and there is increased vascular
flow to the right testicle and right epididymis. There is also a
complex right hydrocele. Findings are most compatible with acute
right epididymo-orchitis.

Bilateral varicoceles, left side greater than right.

Negative for testicular torsion.

## 2018-02-23 IMAGING — CT CT ANGIO CHEST
1 of 3 series · 18 of 32 positions shown · IV contrast (isovue)
Comparison: Chest radiograph June 12, 2015

ADDENDUM:
Critical Value/emergent results were called by telephone at the time
of interpretation on 11/18/2015 at [DATE] to Dr. GEIR ANDRE TI ,
who verbally acknowledged these results.
CLINICAL DATA: Shortness breath for few months. Frequent travel to
[place]. History of pulmonary hypertension. Assess for pulmonary
embolism.

EXAM:
CT ANGIOGRAPHY CHEST WITH CONTRAST
TECHNIQUE: Multidetector CT imaging of the chest was performed using the
standard protocol during bolus administration of intravenous
contrast. Multiplanar CT image reconstructions and MIPs were
obtained to evaluate the vascular anatomy.
CONTRAST:  80 cc Isovue 370

[Series 5: thins · axial · 0.72mm/px · z∈[-277,-3]mm · 18 of 302 slices shown]
[im 14/302  lung]
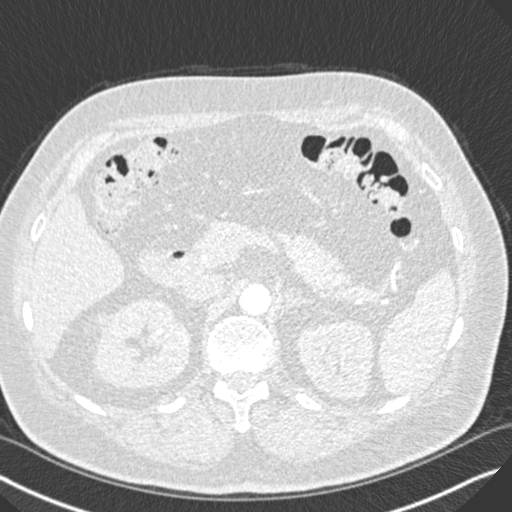
[im 27/302  soft-tissue]
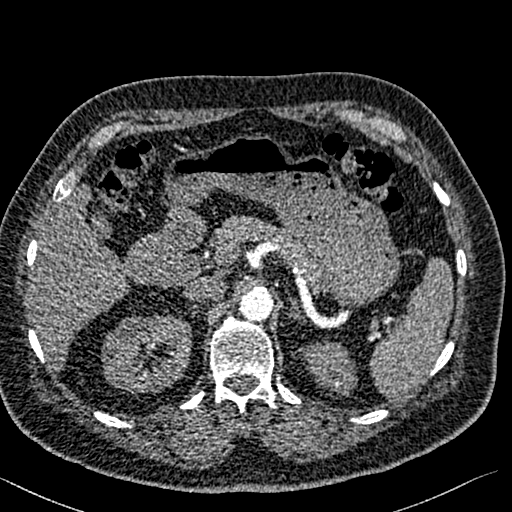
[im 53/302  lung]
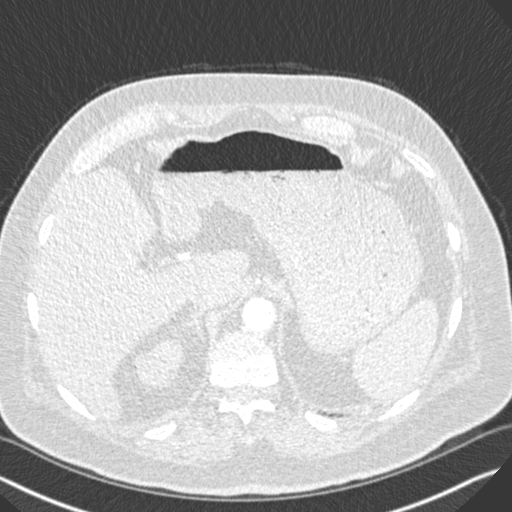
[im 66/302  soft-tissue]
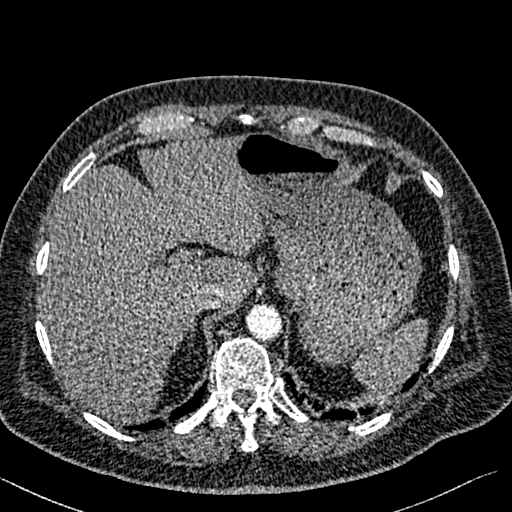
[im 79/302  lung]
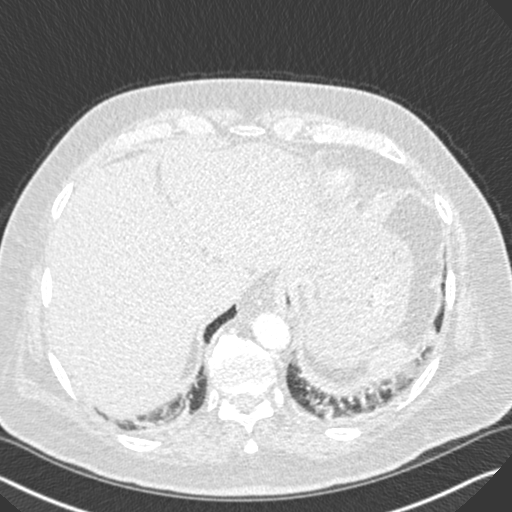
[im 92/302  soft-tissue]
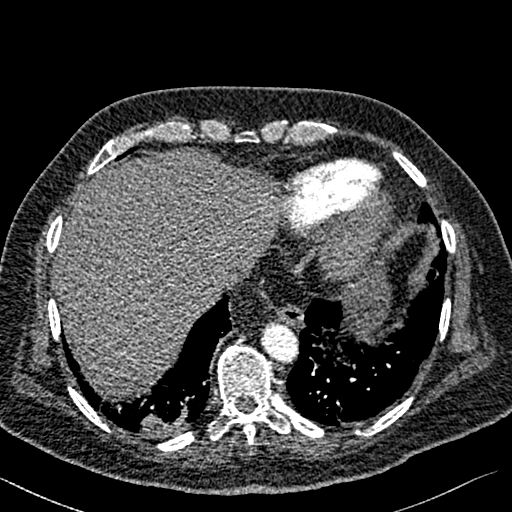
[im 105/302  lung]
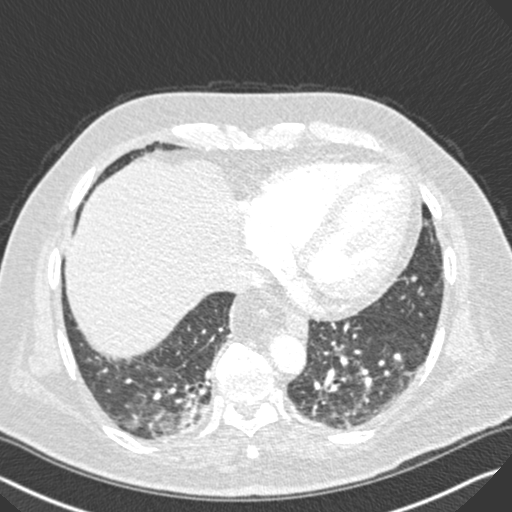
[im 131/302  soft-tissue]
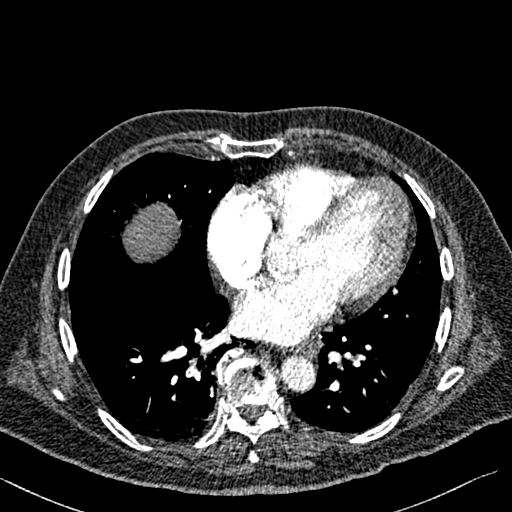
[im 144/302  lung]
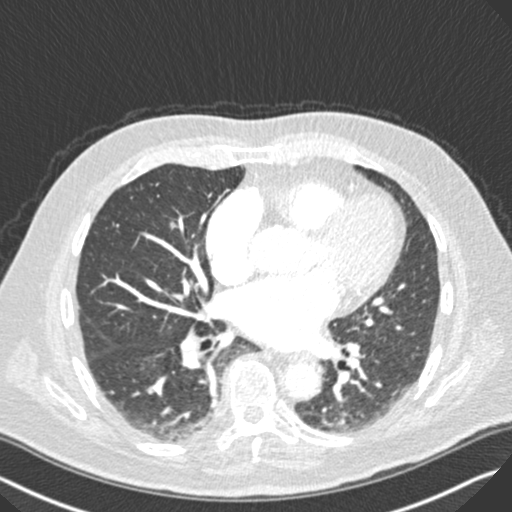
[im 158/302  soft-tissue]
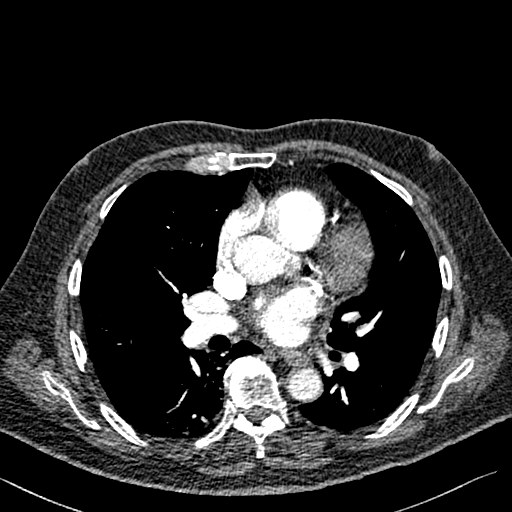
[im 171/302  lung]
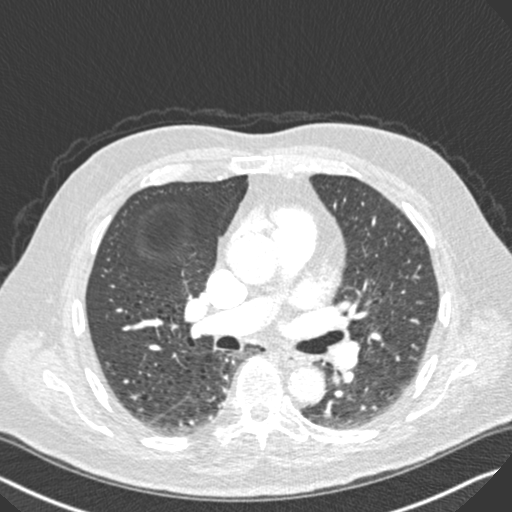
[im 197/302  soft-tissue]
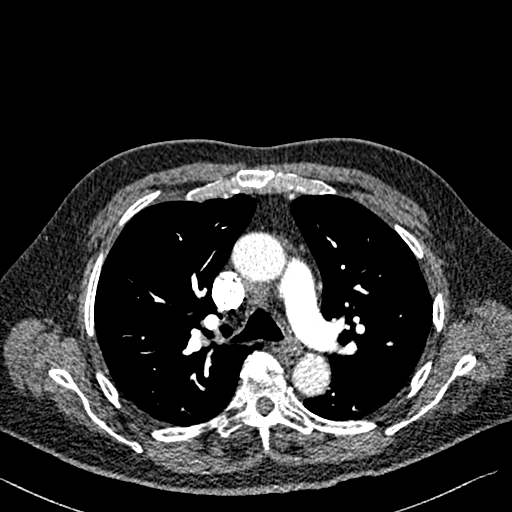
[im 210/302  lung]
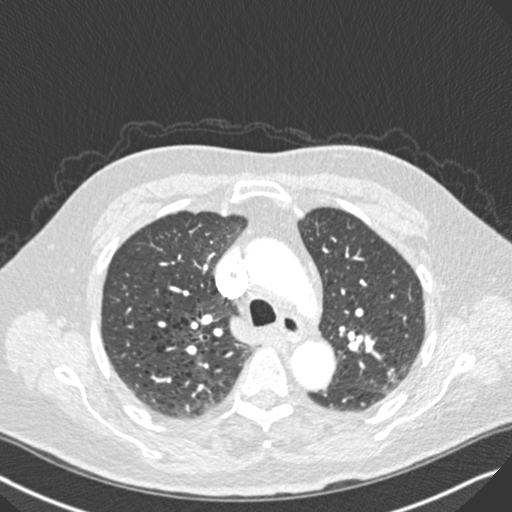
[im 223/302  soft-tissue]
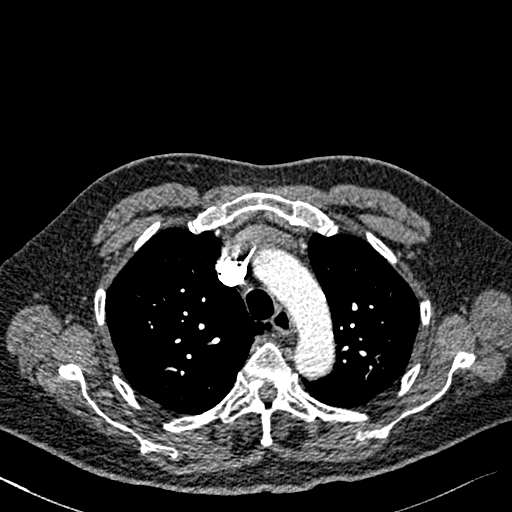
[im 236/302  lung]
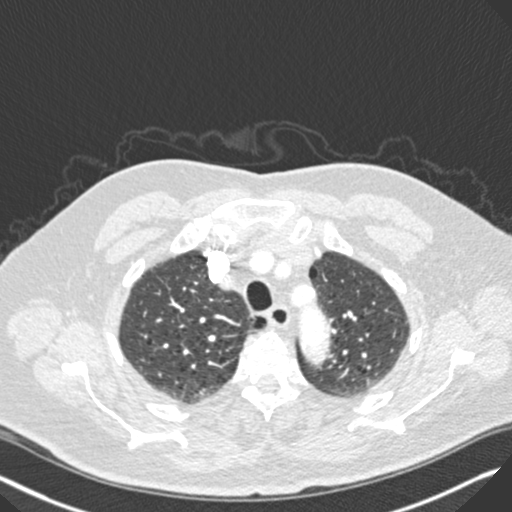
[im 249/302  soft-tissue]
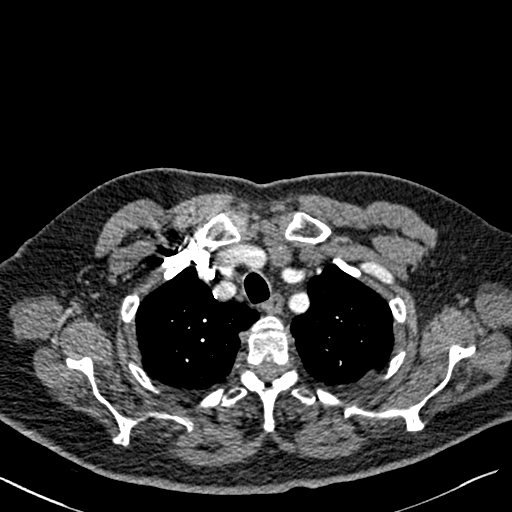
[im 275/302  lung]
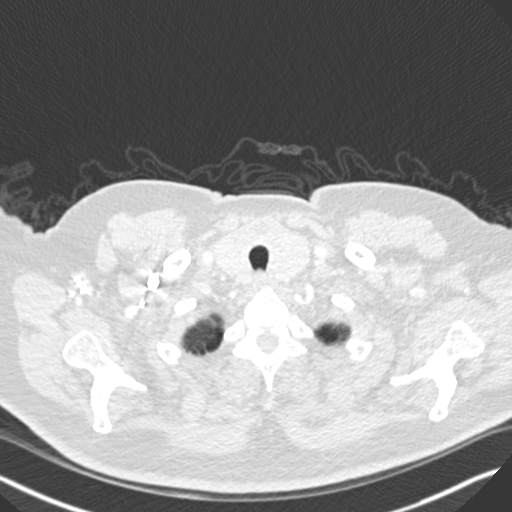
[im 288/302  soft-tissue]
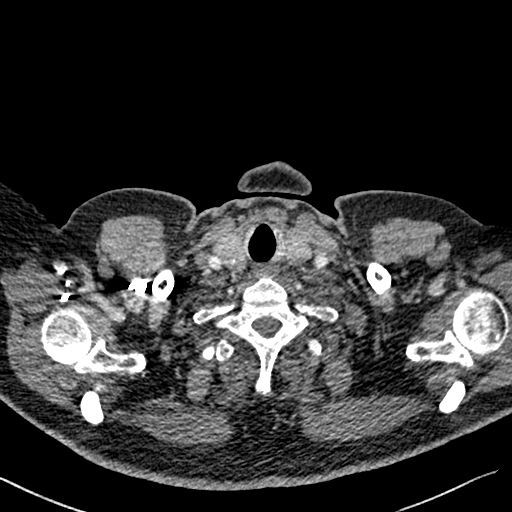

[18 of 32 positions shown; findings below may reference images not displayed]

FINDINGS: CARDIOVASCULAR: Adequate contrast opacification of the pulmonary
artery's. Main pulmonary artery is not enlarged. RIGHT greater than
LEFT lower lobe segmental to subsegmental nonocclusive acute
pulmonary emboli. Heart size is normal, no right heart strain.
Severe coronary artery calcifications. No pericardial effusions.
Thoracic aorta is normal course and caliber, moderate calcific
atherosclerosis.

MEDIASTINUM/NODES: No lymphadenopathy by CT size criteria.

LUNGS/PLEURA: Tracheobronchial tree is patent, no pneumothorax.
Moderate centrilobular emphysema. RIGHT greater than LEFT lower lobe
bandlike densities. No pleural effusion. Focal 5 mm pleural based
nodule along the RIGHT minor fissure.

UPPER ABDOMEN: Included view of the abdomen is nonacute. Partially
imaged 4.5 cm RIGHT interpolar cyst, smaller RIGHT upper pole cyst.
No adrenal mass.

MUSCULOSKELETAL: Visualized soft tissues and included osseous
structures are nonacute. Thyromegaly without dominant nodule.
Punctate thyroid calcifications. Moderate degenerative change of
thoracic spine without destructive bony lesions.

Review of the MIP images confirms the above findings.
IMPRESSION: Acute bilateral lower lobe segmental to subsegmental nonocclusive
pulmonary emboli. No RIGHT heart strain.

Bibasilar atelectasis, possible superimposed RIGHT lower lobe
infarct.

Moderate emphysema.

## 2019-12-15 ENCOUNTER — Other Ambulatory Visit: Payer: Self-pay

## 2019-12-15 ENCOUNTER — Emergency Department (HOSPITAL_COMMUNITY)
Admission: EM | Admit: 2019-12-15 | Discharge: 2019-12-15 | Disposition: A | Discharge status: Home / Self Care | Payer: Self-pay | Attending: Emergency Medicine | Admitting: Emergency Medicine

## 2019-12-15 ENCOUNTER — Emergency Department (HOSPITAL_COMMUNITY): Payer: Self-pay

## 2019-12-15 ENCOUNTER — Encounter (HOSPITAL_COMMUNITY): Payer: Self-pay | Admitting: Emergency Medicine

## 2019-12-15 DIAGNOSIS — I110 Hypertensive heart disease with heart failure: Secondary | ICD-10-CM | POA: Insufficient documentation

## 2019-12-15 DIAGNOSIS — Z7901 Long term (current) use of anticoagulants: Secondary | ICD-10-CM | POA: Insufficient documentation

## 2019-12-15 DIAGNOSIS — I5031 Acute diastolic (congestive) heart failure: Secondary | ICD-10-CM | POA: Insufficient documentation

## 2019-12-15 DIAGNOSIS — R0600 Dyspnea, unspecified: Secondary | ICD-10-CM | POA: Insufficient documentation

## 2019-12-15 DIAGNOSIS — I5033 Acute on chronic diastolic (congestive) heart failure: Secondary | ICD-10-CM

## 2019-12-15 DIAGNOSIS — Z79899 Other long term (current) drug therapy: Secondary | ICD-10-CM | POA: Insufficient documentation

## 2019-12-15 DIAGNOSIS — Z20822 Contact with and (suspected) exposure to covid-19: Secondary | ICD-10-CM | POA: Insufficient documentation

## 2019-12-15 DIAGNOSIS — Z87891 Personal history of nicotine dependence: Secondary | ICD-10-CM | POA: Insufficient documentation

## 2019-12-15 DIAGNOSIS — Z96653 Presence of artificial knee joint, bilateral: Secondary | ICD-10-CM | POA: Insufficient documentation

## 2019-12-15 HISTORY — DX: Heart failure, unspecified: I50.9

## 2019-12-15 LAB — BASIC METABOLIC PANEL
Anion gap: 11 (ref 5–15)
BUN: 19 mg/dL (ref 8–23)
CO2: 25 mmol/L (ref 22–32)
Calcium: 9.4 mg/dL (ref 8.9–10.3)
Chloride: 106 mmol/L (ref 98–111)
Creatinine, Ser: 1.11 mg/dL (ref 0.61–1.24)
GFR, Estimated: >60 mL/min (ref >60)
Glucose, Bld: 126 mg/dL — ABNORMAL HIGH (ref 70–99)
Potassium: 4.1 mmol/L (ref 3.5–5.1)
Sodium: 142 mmol/L (ref 135–145)

## 2019-12-15 LAB — CBC
HCT: 42.1 % (ref 39.0–52.0)
Hemoglobin: 14 g/dL (ref 13.0–17.0)
MCH: 32.3 pg (ref 26.0–34.0)
MCHC: 33.3 g/dL (ref 30.0–36.0)
MCV: 97.2 fL (ref 80.0–100.0)
Platelets: 153 10*3/uL (ref 150–400)
RBC: 4.33 MIL/uL (ref 4.22–5.81)
RDW: 13.6 % (ref 11.5–15.5)
WBC: 4.3 10*3/uL (ref 4.0–10.5)
nRBC: 0 % (ref 0.0–0.2)

## 2019-12-15 LAB — BRAIN NATRIURETIC PEPTIDE: B Natriuretic Peptide: 2779.3 pg/mL — ABNORMAL HIGH (ref 0.0–100.0)

## 2019-12-15 LAB — RESP PANEL BY RT-PCR (FLU A&B, COVID) ARPGX2
Influenza A by PCR: NEGATIVE
Influenza B by PCR: NEGATIVE
SARS Coronavirus 2 by RT PCR: NEGATIVE

## 2019-12-15 MED ORDER — FUROSEMIDE 10 MG/ML IJ SOLN
40.0000 mg | Freq: Once | INTRAMUSCULAR | Status: AC
  Administered 2019-12-15: 40 mg via INTRAVENOUS
  Filled 2019-12-15: qty 4

## 2019-12-15 MED ORDER — FUROSEMIDE 40 MG PO TABS: 40.0000 mg | ORAL_TABLET | Freq: Every day | ORAL | 0 refills | Status: DC

## 2019-12-15 NOTE — ED Provider Notes (Signed)
[facility] EMERGENCY DEPARTMENT Provider Note   CSN: [id number] Arrival date & time: 12/15/19  1208     History Chief Complaint  Patient presents with  . Shortness of Breath    Stephen Hendricks is a 79 y.o. male.  HPI   Pt started feeling short of breath 2 days ago.  He felt it come on somewhat suddenly.  He has been coughing, productive of brown red and clear mucus.  No fever.  No pain no leg swelling.   No weight gain or fluid retention.  Pt has been vaccinated for covid while he was in [place].  Past Medical History:  Diagnosis Date  . Acute pulmonary embolism (HCC) 12/03/2015   Bilateral lower lobe pulmonary emboli 11/2015.  Marland Kitchen BPH (benign prostatic hyperplasia)   . Cataract of right eye   . CHF (congestive heart failure) (HCC)   . Frozen shoulder   . HTN (hypertension)   . Hx of prostatitis 3.20.16   WITH E.COLI  . LBBB (left bundle branch block)   . Leishmaniasis    [place]  . [place]aria    [place]  . Oral candidiasis    WENT TO [place]  . Right tennis elbow   . SOB (shortness of breath)     Patient Active Problem List   Diagnosis Date Noted  . Acute pulmonary embolism (HCC) 12/03/2015  . Cataracts, bilateral 08/05/2015  . Recurrent fever 06/29/2015  . Genital warts 03/19/2015  . Dyspnea 10/03/2014  . Hyperlipidemia 10/03/2014  . LBBB (left bundle branch block)   . Frozen shoulder   . Right tennis elbow   . Nasal dryness 09/29/2014  . Thoracic back pain 09/29/2014  . Numbness of left foot 09/29/2014  . Essential hypertension 09/11/2014  . BPH (benign prostatic hyperplasia) 09/11/2014  . Healthcare maintenance 09/11/2014    Past Surgical History:  Procedure Laterality Date  . CORNEAL LACERATION REPAIR Left 10/10/2015   Procedure: WOUND REVISION WITH REFORMATION OF ANTERIOR CHAMBER;  Surgeon: Carmela Rima, MD;  Location: [facility] OR;  Service: Ophthalmology;  Laterality: Left;  Marland Kitchen GAS INSERTION Left 10/09/2015   Procedure:  INSERTION OF SF6 GAS;  Surgeon: Carmela Rima, MD;  Location: [facility] OR;  Service: Ophthalmology;  Laterality: Left;  . PARS PLANA VITRECTOMY Left 10/09/2015   Procedure: 25 GAUGE VITRECTOMY WITH ENDOLASER, removal of cortical material from anterior chamber, removal of cortical material from posterior chamber, repair of retinal tears and localized detachment, closure of corneal wound (Left;  Surgeon: Carmela Rima, MD;  Location: [facility] OR;  Service: Ophthalmology;  Laterality: Left;  . 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 TREATMENT FISTULA ANAL  1980   pt really couldn't remember       Family History  Problem Relation Age of Onset  . Cardiomyopathy Mother   . Other Father        PROSTATE DISEASE  . Cancer Daughter        BREAST  . Diabetes Brother   . Heart attack Neg Hx   . Hypertension Neg Hx   . Stroke Neg Hx     Social History   Tobacco Use  . Smoking status: Former Smoker    Start date: 01/04/1956    Quit date: 01/04/1991    Years since quitting: 28.9  . Smokeless tobacco: Never Used  Substance Use Topics  . Alcohol use: Yes    Alcohol/week: 3.0 - 4.0 standard drinks    Types: 3 - 4 Standard drinks or equivalent  per week    Comment: ALSO DRINKS BEER  . Drug use: No    Home Medications Prior to Admission medications   Medication Sig Start Date End Date Taking? Authorizing Provider  amLODipine (NORVASC) 10 MG tablet Take 1 tablet (10 mg total) by mouth daily. 12/03/15   Chilton Si, MD  apixaban (ELIQUIS) 5 MG TABS tablet Take 1 tablet (5 mg total) by mouth 2 (two) times daily. 12/03/15   Chilton Si, MD  apixaban (ELIQUIS) 5 MG TABS tablet Take 1 tablet (5 mg total) by mouth 2 (two) times daily. 12/14/15   Chilton Si, MD  famotidine (PEPCID) 20 MG tablet TAKE 1 TABLET (20 MG TOTAL) BY MOUTH 2 (TWO) TIMES DAILY. 10/14/15   Charlsie Quest, MD  finasteride (PROSCAR) 5 MG tablet Take 1 tablet (5 mg total) by mouth daily. 10/14/15   Charlsie Quest, MD   gabapentin (NEURONTIN) 100 MG capsule Take 900 mg by mouth at bedtime.     [provider]  losartan (COZAAR) 25 MG tablet Take 1 tablet (25 mg total) by mouth daily. 12/03/15   Chilton Si, MD  sodium chloride (OCEAN) 0.65 % nasal spray Place 2 sprays into the nose daily as needed for congestion.     [provider]  tamsulosin (FLOMAX) 0.4 MG CAPS capsule Take 2 capsules (0.8 mg total) by mouth daily after supper. 10/14/15   Charlsie Quest, MD  Travoprost, BAK Free, (TRAVATAN) 0.004 % SOLN ophthalmic solution Place 1 drop into the right eye at bedtime.    [provider]    Allergies    Patient has no known allergies.  Review of Systems   Review of Systems  All other systems reviewed and are negative.   Physical Exam Updated Vital Signs BP (!) 163/88   Pulse 67   Temp 98 F (36.7 C) (Oral)   Resp 19   SpO2 95%   Physical Exam Vitals and nursing note reviewed.  Constitutional:      General: He is not in acute distress.    Appearance: He is well-developed and well-nourished.  HENT:     Head: Normocephalic and atraumatic.     Right Ear: External ear normal.     Left Ear: External ear normal.  Eyes:     General: No scleral icterus.       Right eye: No discharge.        Left eye: No discharge.     Conjunctiva/sclera: Conjunctivae normal.  Neck:     Trachea: No tracheal deviation.  Cardiovascular:     Rate and Rhythm: Normal rate and regular rhythm.     Pulses: Intact distal pulses.  Pulmonary:     Effort: Pulmonary effort is normal. No respiratory distress.     Breath sounds: No stridor. Rales present. No wheezing.  Abdominal:     General: Bowel sounds are normal. There is no distension.     Palpations: Abdomen is soft.     Tenderness: There is no abdominal tenderness. There is no guarding or rebound.  Musculoskeletal:        General: No tenderness or edema.     Cervical back: Neck supple.  Skin:    General: Skin is warm and dry.      Findings: No rash.  Neurological:     Mental Status: He is alert.     Cranial Nerves: No cranial nerve deficit (no facial droop, extraocular movements intact, no slurred speech).     Sensory:  No sensory deficit.     Motor: No abnormal muscle tone or seizure activity.     Coordination: Coordination normal.     Deep Tendon Reflexes: Strength normal.  Psychiatric:        Mood and Affect: Mood and affect normal.     ED Results / Procedures / Treatments   Labs (all labs ordered are listed, but only abnormal results are displayed) Labs Reviewed  BASIC METABOLIC PANEL - Abnormal; Notable for the following components:      Result Value   Glucose, Bld 126 (*)    All other components within normal limits  BRAIN NATRIURETIC PEPTIDE - Abnormal; Notable for the following components:   B Natriuretic Peptide 2,779.3 (*)    All other components within normal limits  RESP PANEL BY RT-PCR (FLU A&B, COVID) ARPGX2  CBC    EKG EKG Interpretation  Date/Time:  Sunday December 15 2019 12:47:43 EST Ventricular Rate:  69 PR Interval:  156 QRS Duration: 124 QT Interval:  456 QTC Calculation: 488 R Axis:   152 Text Interpretation: Atrial-sensed ventricular-paced rhythm in a pattern of bigeminy with intrinsic complexes Biventricular pacemaker detected Abnormal ECG No significant change since last tracing Confirmed by Linwood Dibbles [phone]) on 12/15/2019 2:03:21 PM   Radiology No results found.  Procedures Procedures (including critical care time)  Medications Ordered in ED Medications  furosemide (LASIX) injection 40 mg (has no administration in time range)    ED Course  I have reviewed the triage vital signs and the nursing notes.  Pertinent labs & imaging results that were available during my care of the patient were reviewed by me and considered in my medical decision making (see chart for details).  Clinical Course as of 12/15/19 1552  Sun Dec 15, 2019  1532 Patient's chest x-ray  is pending.  CBC and metabolic panel are normal.  BNP is significantly elevated at 2779 [JK]    Clinical Course User Index [JK] Linwood Dibbles, MD   MDM Rules/Calculators/A&P                          Pt with complaints of shortness of breath.   DDx includes chf, pna, less likely covid.  Consider pe but is anticoagulated.  Will check labs, cxr.   Remains stable, no oxygen requirement, breathing easily. Final Clinical Impression(s) / ED Diagnoses dyspnea   Linwood Dibbles, MD 12/15/19 1553

## 2019-12-15 NOTE — Progress Notes (Signed)
History and Physical    Stephen Hendricks WJX:[id number] DOB: 02/03/1940 DOA: 12/15/2019  PCP: Patient, No Pcp Per   Patient coming from:  Home  Chief Complaint: SOB with exertion.   HPI: Stephen Hendricks is a 79 y.o. male with medical history significant for CHF, HTN, BPH, hx of PE who presented to ER with 2 day history of SOB that is worse when exerting himself. He has hx of CHF and takes lasix every other day. He has a cough with brown tinged sputum intermittently. He has not had a fever. He has not had swelling of legs. He denies weight gain. He reports he has not been following his normal diet since returning to [place] from [place] two weeks ago. States he has been eating red meats and nuts which he hadn't been eating when in [place]. He has a pacemaker and has had heart valve surgery in the past. Is on Eliquis. He does not use oxygen at home.    ED Course:  Patient has elevated BNP level and pulmonary edema on CXR. He is given IV lasix with good diuresis in ER. He had desat to 89% and SOB with ambulation in ER.  Pt states he does not want to stay in the hospital overnight. He states his wife is at home with their 4 grandchildren and he wants to go home. He states he feels better and believes he will be ok at home and taking lasix every day.   Review of Systems:  Pt reports SOB with excertion. All other systems reviewed and negative.   Past Medical History:  Diagnosis Date  . Acute pulmonary embolism (HCC) 12/03/2015   Bilateral lower lobe pulmonary emboli 11/2015.  Marland Kitchen BPH (benign prostatic hyperplasia)   . Cataract of right eye   . CHF (congestive heart failure) (HCC)   . Frozen shoulder   . HTN (hypertension)   . Hx of prostatitis 3.20.16   WITH E.COLI  . LBBB (left bundle branch block)   . Leishmaniasis    [place]  [place]    [place]  . Oral candidiasis    [place]  . Right tennis elbow   . SOB (shortness of breath)     Past Surgical History:  Procedure  Laterality Date  . CORNEAL LACERATION REPAIR Left 10/10/2015   Procedure: WOUND REVISION WITH REFORMATION OF ANTERIOR CHAMBER;  Surgeon: Carmela Rima, MD;  Location: [facility] OR;  Service: Ophthalmology;  Laterality: Left;  Marland Kitchen GAS INSERTION Left 10/09/2015   Procedure: INSERTION OF SF6 GAS;  Surgeon: Carmela Rima, MD;  Location: [facility] OR;  Service: Ophthalmology;  Laterality: Left;  . PARS PLANA VITRECTOMY Left 10/09/2015   Procedure: 25 GAUGE VITRECTOMY WITH ENDOLASER, removal of cortical material from anterior chamber, removal of cortical material from posterior chamber, repair of retinal tears and localized detachment, closure of corneal wound (Left;  Surgeon: Carmela Rima, MD;  Location: [facility] OR;  Service: Ophthalmology;  Laterality: Left;  . 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 TREATMENT FISTULA ANAL  1980   pt really couldn't remember    Social History  reports that he quit smoking about 28 years ago. He started smoking about 63 years ago. He has never used smokeless tobacco. He reports current alcohol use of about 3.0 - 4.0 standard drinks of alcohol per week. He reports that he does not use drugs.  No Known Allergies  Family History  Problem Relation Age of Onset  . Cardiomyopathy Mother   .  Other Father        PROSTATE DISEASE  . Cancer Daughter        BREAST  . Diabetes Brother   . Heart attack Neg Hx   . Hypertension Neg Hx   . Stroke Neg Hx      Prior to Admission medications   Medication Sig Start Date End Date Taking? Authorizing Provider  amLODipine (NORVASC) 10 MG tablet Take 1 tablet (10 mg total) by mouth daily. 12/03/15   Chilton Si, MD  apixaban (ELIQUIS) 5 MG TABS tablet Take 1 tablet (5 mg total) by mouth 2 (two) times daily. 12/03/15   Chilton Si, MD  apixaban (ELIQUIS) 5 MG TABS tablet Take 1 tablet (5 mg total) by mouth 2 (two) times daily. 12/14/15   Chilton Si, MD  famotidine (PEPCID) 20 MG tablet TAKE 1 TABLET (20 MG TOTAL) BY MOUTH 2  (TWO) TIMES DAILY. 10/14/15   Charlsie Quest, MD  finasteride (PROSCAR) 5 MG tablet Take 1 tablet (5 mg total) by mouth daily. 10/14/15   Charlsie Quest, MD  furosemide (LASIX) 40 MG tablet Take 1 tablet (40 mg total) by mouth daily. 12/15/19   Charlynne Pander, MD  gabapentin (NEURONTIN) 100 MG capsule Take 900 mg by mouth at bedtime.     [provider]  losartan (COZAAR) 25 MG tablet Take 1 tablet (25 mg total) by mouth daily. 12/03/15   Chilton Si, MD  sodium chloride (OCEAN) 0.65 % nasal spray Place 2 sprays into the nose daily as needed for congestion.     [provider]  tamsulosin (FLOMAX) 0.4 MG CAPS capsule Take 2 capsules (0.8 mg total) by mouth daily after supper. 10/14/15   Charlsie Quest, MD  Travoprost, BAK Free, (TRAVATAN) 0.004 % SOLN ophthalmic solution Place 1 drop into the right eye at bedtime.    [provider]    Physical Exam: Vitals:   12/15/19 1500 12/15/19 1545 12/15/19 1645 12/15/19 1745  BP: (!) 143/87 (!) 163/88 (!) 146/85 (!) 151/89  Pulse: 62 67 69 72  Resp: 18 19 18 20   Temp:      TempSrc:      SpO2: 96% 95% 96% 95%    Constitutional: NAD, calm, comfortable Vitals:   12/15/19 1500 12/15/19 1545 12/15/19 1645 12/15/19 1745  BP: (!) 143/87 (!) 163/88 (!) 146/85 (!) 151/89  Pulse: 62 67 69 72  Resp: 18 19 18 20   Temp:      TempSrc:      SpO2: 96% 95% 96% 95%   General: WDWN, Alert and oriented x3.  Eyes: EOMI, PERRL, conjunctivae normal.  Sclera nonicteric HENT:  [place]/AT, external ears normal.  Nares patent without epistasis.  Neck: Soft, normal range of motion. Trachea midline Respiratory: Equal breath sounds. Diffuse rales. no wheezing, no crackles. Normal respiratory effort. No accessory muscle use.  Cardiovascular: Regular rate and rhythm, no murmurs / rubs / gallops. No extremity edema. Pacemaker in left upper chest. Abdomen: Soft, no tenderness, nondistended, no rebound or guarding. No masses palpated.  Bowel sounds normoactive Musculoskeletal: FROM. no clubbing / cyanosis. Normal muscle tone.  Skin: Warm, dry, intact no rashes, lesions, ulcers. No induration Neurologic: CN 2-12 grossly intact.  Normal speech.  Psychiatric: Normal judgment and insight.  Normal mood.    Labs on Admission: I have personally reviewed following labs and imaging studies  CBC: Recent Labs  Lab 12/15/19 1249  WBC 4.3  HGB 14.0  HCT 42.1  MCV 97.2  PLT 153    Basic Metabolic Panel: Recent Labs  Lab 12/15/19 1249  NA 142  K 4.1  CL 106  CO2 25  GLUCOSE 126*  BUN 19  CREATININE 1.11  CALCIUM 9.4    GFR: CrCl cannot be calculated (Unknown ideal weight.).  Liver Function Tests: No results for input(s): AST, ALT, ALKPHOS, BILITOT, PROT, ALBUMIN in the last 168 hours.  Urine analysis:    Component Value Date/Time   COLORURINE AMBER (A) 10/29/2014 1838   APPEARANCEUR CLOUDY (A) 10/29/2014 1838   LABSPEC 1.023 10/29/2014 1838   PHURINE 6.0 10/29/2014 1838   GLUCOSEU NEGATIVE 10/29/2014 1838   HGBUR LARGE (A) 10/29/2014 1838   BILIRUBINUR SMALL (A) 10/29/2014 1838   KETONESUR NEGATIVE 10/29/2014 1838   PROTEINUR 100 (A) 10/29/2014 1838   UROBILINOGEN 1.0 10/29/2014 1838   NITRITE POSITIVE (A) 10/29/2014 1838   LEUKOCYTESUR LARGE (A) 10/29/2014 1838    Radiological Exams on Admission: DG Chest Port 1 View  Result Date: 12/15/2019 CLINICAL DATA:  Shortness of breath. EXAM: PORTABLE CHEST 1 VIEW COMPARISON:  Radiograph 06/12/2015 FINDINGS: Left-sided pacemaker remains in place. Cardiomegaly. Unchanged mediastinal contours. Development of bilateral perihilar opacities and diffuse peribronchial cuffing. There may be small pleural effusions. No pneumothorax. No acute osseous abnormalities are seen. Degenerative change in the right shoulder. IMPRESSION: 1. Bilateral perihilar opacities and peribronchial cuffing, favoring pulmonary edema over multifocal pneumonia. 2. Cardiomegaly. Possible small  pleural effusions. Electronically Signed   By: Narda Rutherford M.D.   On: 12/15/2019 16:17     Assessment/Plan     CHF exacerbation Mr. Ayoub has hx of CHF with acute CHF exacerbation. He has received lasix IV in the ER and states he is feeling better. He feels that he brought this exacerbation on himself by not being careful with his diet for the past 2 weeks since he returned to [place] from [place] where he has lived for past 3 years. His BNP level is increased at 2779.  He does not want to stay in the hospital overnight and requests to be discharged from the ER. His son is a Development worker, community in [place]. ER physician discussed with the son that patient does not want to stay and son understands and agrees with his father. Lasix will be increased to 40 mg daily. Mr. Cothron is discharged by the ER physician to home with instructions to return if condition worsens.      Claudean Severance Manoah Deckard MD [facility]   12/15/2019, 7:38 PM

## 2019-12-15 NOTE — ED Notes (Signed)
[id number] Stephen Hendricks son

## 2019-12-15 NOTE — ED Triage Notes (Signed)
C/o SOB and cough x 2 days with clear, brown, and red sputum.  Denies pain.  Hx of CHF.

## 2019-12-15 NOTE — ED Notes (Signed)
Add on request BNP TO MAIN LAB

## 2019-12-15 NOTE — Discharge Instructions (Signed)
Increase your Lasix to 40 mg daily. I would prefer that you stay in the hospital but you decided to go home  Please call cardiology office tomorrow to get appointment this week for follow-up.  I have asked the case manager to call you about follow-up as well.  Return to ER if you have worse shortness of breath, chest pain,

## 2019-12-15 NOTE — ED Notes (Signed)
Pt ambulated while monitoring pulse ox. Pt dropped to 89% on RA and became extremely SOB. Pt placed 2 L [place] after to increase O2

## 2019-12-15 NOTE — ED Notes (Signed)
Pt discharged via wheelchair. All questions and concerns addressed. No complaints at this time.  ° °

## 2019-12-15 NOTE — ED Notes (Signed)
IV team consult ordered for difficult stick.

## 2019-12-15 NOTE — ED Provider Notes (Addendum)
  Physical Exam  BP (!) 151/89   Pulse 72   Temp 98 F (36.7 C) (Oral)   Resp 20   SpO2 95%   Physical Exam  ED Course/Procedures   Clinical Course as of 12/15/19 1858  Sun Dec 15, 2019  1532 Patient's chest x-ray is pending.  CBC and metabolic panel are normal.  BNP is significantly elevated at 2779 [JK]    Clinical Course User Index [JK] Linwood Dibbles, MD    Procedures  MDM  Care assumed at 3 PM.  Patient is here with worsening shortness of breath.  Patient has a history of diastolic heart failure.  Patient also just came back from [place] 2 weeks ago.  Patient has not seen cardiology for several years.  Patient is taking Lasix every other day.  BNP is elevated at 2000.  Signout pending chest x-ray and Lasix and reassessment  6:59 PM Desat to 89% when ambulating and very short of breath.  Patient is diuresing well.  Will admit for CHF exacerbation.  Will likely need repeat echo since patient's last echo was 5 years ago  7:30 PM Patient is very firm that he does not want to stay in the hospital. I had an extensive discussion with him and his son. We'll increase his Lasix to 40 mg daily. We'll have him follow-up with heart failure clinic. I told him that if he has worsening symptoms he needs to return back to the ED.  Charlynne Pander, MD 12/15/19 1900    Charlynne Pander, MD 12/15/19 [phone]

## 2019-12-18 ENCOUNTER — Telehealth: Payer: Self-pay | Admitting: *Deleted

## 2019-12-18 NOTE — Telephone Encounter (Signed)
Spouse called regarding number to call for follow-up appointment.  RNCM reviewed After Visit Summary (AVS) to find that pt was to call [facility] [phone]).  Advised spouse to call promptly.

## 2022-10-27 ENCOUNTER — Telehealth (HOSPITAL_COMMUNITY): Payer: Self-pay | Admitting: *Deleted

## 2022-10-27 ENCOUNTER — Telehealth (HOSPITAL_COMMUNITY): Payer: Self-pay

## 2022-10-27 NOTE — Telephone Encounter (Signed)
Received referral from Dr. Catheryn Bacon at [facility] for this pt to participate in Cardiac rehab with the diagnosis of Chronic Systolic Heart Failure. Pt seen in follow up on 9/17 with Stephen Carnes DNP reestablishing care in the heart failure clinic as he has been in [place] for an extended visit. While in [place] pt completed traditional CR. Looks to be about 30 sessions. Called and spoke to [place] at [facility] CR requesting discharge report and EKG strips. Will need to verify coverage for intensive CR. Sent to [facility] heart failure clinic request for demographic and insurance information. Also asked if there is a more recent 12 lead EKG as the one sent was from 2023. Pt had new ICD placed while in [place] and will need to follow up with the device clinic. Called and spoke to pt wife who is listed on the DPR advising that we have the referral but are needing additional information. Once obtained will be able to contact for scheduling. Presently at an appt in [place]. Verbalized understanding.

## 2022-10-27 NOTE — Telephone Encounter (Signed)
Pt's wife called and I verified she was on the [facility]. She is trying to get him into cardiac rehab as soon as possible as they have been home from [place] and he has not been exercising. I explained to her we are waiting on some more information/forms to be sent from [facility] and to check his insurance to see if they will cover his cardiac rehab. She verbalized she understood and that she will wait for [place] to call back.

## 2022-11-18 ENCOUNTER — Telehealth (HOSPITAL_COMMUNITY): Payer: Self-pay

## 2022-11-18 NOTE — Telephone Encounter (Signed)
Pt insurance is active and benefits verified through [employer]. Pt primary insurance [employer] is OON and his plan does not have OON benefits. Eli M./[employer], 11/18/22 @ 2:12PM, JXB#[id number]   2ndary insurance is active and benefits verified through [employer]. Co-pay $4.00, DED $0.00/$0.00 met, out of pocket $0.00/$0.00 met, co-insurance 0%. No pre-authorization required. Lou D./[employer], 11/18/22 @ 2:29PM, REF#[id number]    How many CR sessions are covered? 36 visits for TCR Is this a lifetime maximum or an annual maximum? Annual Has the member used any of these services to date? No Is there a time limit (weeks/months) on start of program and/or program completion? No

## 2022-11-22 ENCOUNTER — Telehealth (HOSPITAL_COMMUNITY): Payer: Self-pay

## 2022-11-22 NOTE — Telephone Encounter (Signed)
Pt wife Orlie Pollen called in regards to pt Stephen Hendricks, pt wife we are still waiting Medicaid form from Dr. Orlie Pollen stated she will f/u with the office.

## 2022-12-12 ENCOUNTER — Telehealth (HOSPITAL_COMMUNITY): Payer: Self-pay

## 2022-12-12 NOTE — Telephone Encounter (Signed)
Pt's wife called to get pt scheduled for cardiac rehab. Pt will come in for orientation on 12/10@1030  and will attend the 12:30 exercise class time.   Gave pt wife information about the program over the phone.

## 2022-12-13 ENCOUNTER — Encounter (HOSPITAL_COMMUNITY)
Admission: RE | Admit: 2022-12-13 | Discharge: 2022-12-13 | Disposition: A | Discharge status: Home / Self Care | Payer: BLUE CROSS/BLUE SHIELD | Source: Ambulatory Visit | Attending: Cardiology | Admitting: Cardiology

## 2022-12-13 VITALS — BP 101/62 | HR 63 | Ht 71.0 in | Wt 174.2 lb

## 2022-12-13 DIAGNOSIS — I5022 Chronic systolic (congestive) heart failure: Secondary | ICD-10-CM | POA: Diagnosis not present

## 2022-12-13 DIAGNOSIS — Z5189 Encounter for other specified aftercare: Secondary | ICD-10-CM | POA: Insufficient documentation

## 2022-12-13 NOTE — Progress Notes (Signed)
Cardiac Individual Treatment Plan  Patient Details  Name: Stephen Hendricks MRN: [id number] Date of Birth: 1940-01-22 Referring Provider:   Flowsheet Row CARDIAC REHAB PHASE II ORIENTATION from 12/13/2022 in [facility]  Referring Provider Dr. Sullivan Lone ([name], MD covering)       Initial Encounter Date:  Flowsheet Row CARDIAC REHAB PHASE II ORIENTATION from 12/13/2022 in [facility] for [facility], [facility]  Date 12/13/22       Visit Diagnosis: Heart failure, chronic systolic (HCC)  Patient's Home Medications on Admission:  Current Outpatient Medications:    apixaban (ELIQUIS) 5 MG TABS tablet, Take 1 tablet (5 mg total) by mouth 2 (two) times daily., Disp: 60 tablet, Rfl: 5   benzonatate (TESSALON) 100 MG capsule, Take 100 mg by mouth 3 (three) times daily. Take 1 to 2 capsules by mouth up to 3 times daily as needed for cough., Disp: , Rfl:    Cholecalciferol 25 MCG (1000 UT) TBDP, Take 25 mcg by mouth daily. 1,000 units, Disp: , Rfl:    eplerenone (INSPRA) 25 MG tablet, Take 25 mg by mouth daily., Disp: , Rfl:    famotidine (PEPCID) 20 MG tablet, TAKE 1 TABLET (20 MG TOTAL) BY MOUTH 2 (TWO) TIMES DAILY., Disp: 300 tablet, Rfl: 0   FARXIGA 10 MG TABS tablet, Take 10 mg by mouth daily., Disp: , Rfl:    finasteride (PROSCAR) 5 MG tablet, Take 1 tablet (5 mg total) by mouth daily., Disp: 150 tablet, Rfl: 0   Flaxseed, Linseed, (FLAXSEED OIL PO), Take 1 capsule by mouth daily., Disp: , Rfl:    GARLIC PO, Take 1,000 mg by mouth once as needed. Pt takes a 5,000 mg tablet - 1 tablet every day., Disp: , Rfl:    losartan (COZAAR) 25 MG tablet, Take 1 tablet (25 mg total) by mouth daily., Disp: 90 tablet, Rfl: 3   magnesium oxide (MAG-OX) 400 MG tablet, Take 1 tablet by mouth 2 (two) times daily., Disp: , Rfl:    metoprolol succinate (TOPROL-XL) 25 MG 24 hr tablet, Take 12.5 mg by mouth daily.,  Disp: , Rfl:    nitroGLYCERIN (NITROSTAT) 0.4 MG SL tablet, Place 0.4 mg under the tongue every 5 (five) minutes as needed., Disp: , Rfl:    omeprazole (PRILOSEC) 20 MG capsule, Take 20 mg by mouth 2 (two) times daily before a meal., Disp: , Rfl:    rosuvastatin (CRESTOR) 10 MG tablet, Take 1 tablet by mouth daily., Disp: , Rfl:    tamsulosin (FLOMAX) 0.4 MG CAPS capsule, Take 2 capsules (0.8 mg total) by mouth daily after supper., Disp: 300 capsule, Rfl: 0   torsemide (DEMADEX) 10 MG tablet, Take 10 mg by mouth daily as needed., Disp: , Rfl:    Travoprost, BAK Free, (TRAVATAN) 0.004 % SOLN ophthalmic solution, Place 1 drop into the right eye at bedtime., Disp: , Rfl:    amLODipine (NORVASC) 10 MG tablet, Take 1 tablet (10 mg total) by mouth daily. (Patient not taking: Reported on 12/13/2022), Disp: 90 tablet, Rfl: 3   apixaban (ELIQUIS) 5 MG TABS tablet, Take 1 tablet (5 mg total) by mouth 2 (two) times daily. (Patient not taking: Reported on 12/13/2022), Disp: 180 tablet, Rfl: 1   furosemide (LASIX) 40 MG tablet, Take 1 tablet (40 mg total) by mouth daily. (Patient not taking: Reported on 12/13/2022), Disp: 7 tablet, Rfl: 0   gabapentin (NEURONTIN) 100 MG capsule, Take  900 mg by mouth at bedtime.  (Patient not taking: Reported on 12/13/2022), Disp: , Rfl:    sodium chloride (OCEAN) 0.65 % nasal spray, Place 2 sprays into the nose daily as needed for congestion.  (Patient not taking: Reported on 12/13/2022), Disp: , Rfl:   Past Medical History: Past Medical History:  Diagnosis Date   Acute pulmonary embolism (HCC) 12/03/2015   Bilateral lower lobe pulmonary emboli 11/2015.   BPH (benign prostatic hyperplasia)    Cataract of right eye    CHF (congestive heart failure) (HCC)    Frozen shoulder    HTN (hypertension)    Hx of prostatitis 3.20.16   WITH E.COLI   LBBB (left bundle branch block)    Leishmaniasis    [place]   [place]   Oral candidiasis    [place]   Right tennis elbow    SOB (shortness of breath)     Tobacco Use: Social History   Tobacco Use  Smoking Status Former   Current packs/day: 0.00   Types: Cigarettes   Start date: 01/04/1956   Quit date: 01/04/1991   Years since quitting: 31.9  Smokeless Tobacco Never    Labs: Review Flowsheet       Latest Ref Rng & Units 09/11/2014 09/29/2014 11/14/2014  Labs for ITP Cardiac and Pulmonary Rehab  Cholestrol 125 - 200 mg/dL - 161  096   LDL (calc) <130 mg/dL - 045  49   HDL-C >=40 mg/dL - 67  70   Trlycerides <150 mg/dL - 981  66   Hemoglobin A1c - 5.1  - -    Details            Capillary Blood Glucose: Lab Results  Component Value Date   GLUCAP 91 09/11/2014     Exercise Target Goals: Exercise Program Goal: Individual exercise prescription set using results from initial 6 min walk test and THRR while considering  patient's activity barriers and safety.   Exercise Prescription Goal: Initial exercise prescription builds to 30-45 minutes a day of aerobic activity, 2-3 days per week.  Home exercise guidelines will be given to patient during program as part of exercise prescription that the participant will acknowledge.  Activity Barriers & Risk Stratification:  Activity Barriers & Cardiac Risk Stratification - 12/13/22 1414       Activity Barriers & Cardiac Risk Stratification   Activity Barriers Balance Concerns;Arthritis;Joint Problems;History of Falls;Back Problems;Deconditioning;Assistive Device;Muscular Weakness;Left Knee Replacement;Decreased Ventricular Function    Cardiac Risk Stratification High             6 Minute Walk:  6 Minute Walk     Row Name 12/13/22 1220         6 Minute Walk   Phase Initial     Distance 720 feet     Walk Time 6 minutes     # of Rest Breaks 1  2:28-3:19 (Pt was over THR)     MPH 1.4     METS 1.7     RPE 11     Perceived Dyspnea  0     VO2 Peak 6     Symptoms No     Resting HR 66 bpm     Resting BP 101/62      Resting Oxygen Saturation  97 %     Exercise Oxygen Saturation  during 6 min walk 97 %     Max Ex. HR 119 bpm  Max Ex. BP 125/80     2 Minute Post BP 125/81              Oxygen Initial Assessment:   Oxygen Re-Evaluation:   Oxygen Discharge (Final Oxygen Re-Evaluation):   Initial Exercise Prescription:  Initial Exercise Prescription - 12/13/22 1400       Date of Initial Exercise RX and Referring Provider   Date 12/13/22    Referring Provider Dr. Sullivan Lone [name], MD covering)    Expected Discharge Date 03/08/22      NuStep   Level 1    SPM 75    Minutes 25    METs 1.7      Prescription Details   Frequency (times per week) 3    Duration Progress to 30 minutes of continuous aerobic without signs/symptoms of physical distress      Intensity   THRR 40-80% of Max Heartrate 56-111    Ratings of Perceived Exertion 11-13    Perceived Dyspnea 0-4      Progression   Progression Continue progressive overload as per policy without signs/symptoms or physical distress.      Resistance Training   Training Prescription Yes    Weight 2 lbs    Reps 10-15             Perform Capillary Blood Glucose checks as needed.  Exercise Prescription Changes:   Exercise Comments:   Exercise Goals and Review:   Exercise Goals     Row Name 12/13/22 1416             Exercise Goals   Increase Physical Activity Yes       Intervention Provide advice, education, support and counseling about physical activity/exercise needs.;Develop an individualized exercise prescription for aerobic and resistive training based on initial evaluation findings, risk stratification, comorbidities and participant's personal goals.       Expected Outcomes Short Term: Attend rehab on a regular basis to increase amount of physical activity.;Long Term: Exercising regularly at least 3-5 days a week.;Long Term: Add in home exercise to make exercise part of routine and to increase amount of  physical activity.       Increase Strength and Stamina Yes       Intervention Provide advice, education, support and counseling about physical activity/exercise needs.;Develop an individualized exercise prescription for aerobic and resistive training based on initial evaluation findings, risk stratification, comorbidities and participant's personal goals.       Expected Outcomes Short Term: Increase workloads from initial exercise prescription for resistance, speed, and METs.;Short Term: Perform resistance training exercises routinely during rehab and add in resistance training at home;Long Term: Improve cardiorespiratory fitness, muscular endurance and strength as measured by increased METs and functional capacity ( )       Able to understand and use rate of perceived exertion (RPE) scale Yes       Intervention Provide education and explanation on how to use RPE scale       Expected Outcomes Short Term: Able to use RPE daily in rehab to express subjective intensity level;Long Term:  Able to use RPE to guide intensity level when exercising independently       Knowledge and understanding of Target Heart Rate Range (THRR) Yes       Intervention Provide education and explanation of THRR including how the numbers were predicted and where they are located for reference       Expected Outcomes Short Term: Able to state/look up THRR;Long Term: Able to use  THRR to govern intensity when exercising independently;Short Term: Able to use daily as guideline for intensity in rehab       Understanding of Exercise Prescription Yes       Intervention Provide education, explanation, and written materials on patient's individual exercise prescription       Expected Outcomes Short Term: Able to explain program exercise prescription;Long Term: Able to explain home exercise prescription to exercise independently                Exercise Goals Re-Evaluation :   Discharge Exercise Prescription (Final Exercise  Prescription Changes):   Nutrition:  Target Goals: Understanding of nutrition guidelines, daily intake of sodium 1500mg , cholesterol 200mg , calories 30% from fat and 7% or less from saturated fats, daily to have 5 or more servings of fruits and vegetables.  Biometrics:  Pre Biometrics - 12/13/22 1115       Pre Biometrics   Waist Circumference 38 inches    Hip Circumference 41 inches    Waist to Hip Ratio 0.93 %    Triceps Skinfold 16 mm    % Body Fat 25.8 %    Grip Strength 30 kg    Flexibility --   Not performed. active low back pain   Single Leg Stand 1.5 seconds              Nutrition Therapy Plan and Nutrition Goals:   Nutrition Assessments:  MEDIFICTS Score Key: >=70 Need to make dietary changes  40-70 Heart Healthy Diet <= 40 Therapeutic Level Cholesterol Diet    Picture Your Plate Scores: <16 Unhealthy dietary pattern with much room for improvement. 41-50 Dietary pattern unlikely to meet recommendations for good health and room for improvement. 51-60 More healthful dietary pattern, with some room for improvement.  >60 Healthy dietary pattern, although there may be some specific behaviors that could be improved.    Nutrition Goals Re-Evaluation:   Nutrition Goals Re-Evaluation:   Nutrition Goals Discharge (Final Nutrition Goals Re-Evaluation):   Psychosocial: Target Goals: Acknowledge presence or absence of significant depression and/or stress, maximize coping skills, provide positive support system. Participant is able to verbalize types and ability to use techniques and skills needed for reducing stress and depression.  Initial Review & Psychosocial Screening:  Initial Psych Review & Screening - 12/13/22 1422       Initial Review   Current issues with Current Depression;Current Stress Concerns    Source of Stress Concerns Financial;Chronic Illness;Unable to participate in former interests or hobbies    Comments Pt has financial stress due to  the war in [place]. Pt has lost his pension as a reult of the war. He has had friends die and the strife in his homeland worries him. Due to his chroinc illness, he does not do things he used to enjoy. Refuses any counseling at his time.      Family Dynamics   Good Support System? Yes   Has his spouse for support.     Barriers   Psychosocial barriers to participate in program The patient should benefit from training in stress management and relaxation.      Screening Interventions   Interventions Encouraged to exercise    Expected Outcomes Long Term Goal: Stressors or current issues are controlled or eliminated.;Short Term goal: Utilizing psychosocial counselor, staff and physician to assist with identification of specific Stressors or current issues interfering with healing process. Setting desired goal for each stressor or current issue identified.;Short Term goal: Identification and review with participant  of any Quality of Life or Depression concerns found by scoring the questionnaire.;Long Term goal: The participant improves quality of Life and PHQ9 Scores as seen by post scores and/or verbalization of changes             Quality of Life Scores:  Quality of Life - 12/13/22 1427       Quality of Life   Select Quality of Life      Quality of Life Scores   Health/Function Pre 16.57 %    Socioeconomic Pre 16.75 %    Psych/Spiritual Pre 18.93 %    Family Pre 25.3 %    GLOBAL Pre 18.38 %            Scores of 19 and below usually indicate a poorer quality of life in these areas.  A difference of  2-3 points is a clinically meaningful difference.  A difference of 2-3 points in the total score of the Quality of Life Index has been associated with significant improvement in overall quality of life, self-image, physical symptoms, and general health in studies assessing change in quality of life.  PHQ-9: Review Flowsheet  More data exists      12/13/2022 08/05/2015 06/25/2015 04/24/2015  12/24/2014  Depression screen PHQ 2/9  Decreased Interest 1 0 0 0 0  Down, Depressed, Hopeless 1 0 0 0 0  PHQ - 2 Score 2 0 0 0 0  Altered sleeping 0 - - - -  Tired, decreased energy 2 - - - -  Change in appetite 2 - - - -  Feeling bad or failure about yourself  0 - - - -  Trouble concentrating 3 - - - -  Moving slowly or fidgety/restless 0 - - - -  Suicidal thoughts 0 - - - -  PHQ-9 Score 9 - - - -  Difficult doing work/chores Not difficult at all - - - -    Details           Interpretation of Total Score  Total Score Depression Severity:  1-4 = Minimal depression, 5-9 = Mild depression, 10-14 = Moderate depression, 15-19 = Moderately severe depression, 20-27 = Severe depression   Psychosocial Evaluation and Intervention:   Psychosocial Re-Evaluation:   Psychosocial Discharge (Final Psychosocial Re-Evaluation):   Vocational Rehabilitation: Provide vocational rehab assistance to qualifying candidates.   Vocational Rehab Evaluation & Intervention:  Vocational Rehab - 12/13/22 1428       Initial Vocational Rehab Evaluation & Intervention   Assessment shows need for Vocational Rehabilitation No   Pt is retired            Education: Education Goals: Education classes will be provided on a weekly basis, covering required topics. Participant will state understanding/return demonstration of topics presented.     Core Videos: Exercise    Move It!  Clinical staff conducted group or individual video education with verbal and written material and guidebook.  Patient learns the recommended Pritikin exercise program. Exercise with the goal of living a long, healthy life. Some of the health benefits of exercise include controlled diabetes, healthier blood pressure levels, improved cholesterol levels, improved heart and lung capacity, improved sleep, and better body composition. Everyone should speak with their doctor before starting or changing an exercise  routine.  Biomechanical Limitations Clinical staff conducted group or individual video education with verbal and written material and guidebook.  Patient learns how biomechanical limitations can impact exercise and how we can mitigate and possibly overcome limitations to  have an impactful and balanced exercise routine.  Body Composition Clinical staff conducted group or individual video education with verbal and written material and guidebook.  Patient learns that body composition (ratio of muscle mass to fat mass) is a key component to assessing overall fitness, rather than body weight alone. Increased fat mass, especially visceral belly fat, can put [place] at increased risk for metabolic syndrome, type 2 diabetes, heart disease, and even death. It is recommended to combine diet and exercise (cardiovascular and resistance training) to improve your body composition. Seek guidance from your physician and exercise physiologist before implementing an exercise routine.  Exercise Action Plan Clinical staff conducted group or individual video education with verbal and written material and guidebook.  Patient learns the recommended strategies to achieve and enjoy long-term exercise adherence, including variety, self-motivation, self-efficacy, and positive decision making. Benefits of exercise include fitness, good health, weight management, more energy, better sleep, less stress, and overall well-being.  Medical   Heart Disease Risk Reduction Clinical staff conducted group or individual video education with verbal and written material and guidebook.  Patient learns our heart is our most vital organ as it circulates oxygen, nutrients, white blood cells, and hormones throughout the entire body, and carries waste away. Data supports a plant-based eating plan like the Pritikin Program for its effectiveness in slowing progression of and reversing heart disease. The video provides a number of recommendations to  address heart disease.   Metabolic Syndrome and Belly Fat  Clinical staff conducted group or individual video education with verbal and written material and guidebook.  Patient learns what metabolic syndrome is, how it leads to heart disease, and how one can reverse it and keep it from coming back. You have metabolic syndrome if you have 3 of the following 5 criteria: abdominal obesity, high blood pressure, high triglycerides, low HDL cholesterol, and high blood sugar.  Hypertension and Heart Disease Clinical staff conducted group or individual video education with verbal and written material and guidebook.  Patient learns that high blood pressure, or hypertension, is very common in the [place]. Hypertension is largely due to excessive salt intake, but other important risk factors include being overweight, physical inactivity, drinking too much alcohol, smoking, and not eating enough potassium from fruits and vegetables. High blood pressure is a leading risk factor for heart attack, stroke, congestive heart failure, dementia, kidney failure, and premature death. Long-term effects of excessive salt intake include stiffening of the arteries and thickening of heart muscle and organ damage. Recommendations include ways to reduce hypertension and the risk of heart disease.  Diseases of Our Time - Focusing on Diabetes Clinical staff conducted group or individual video education with verbal and written material and guidebook.  Patient learns why the best way to stop diseases of our time is prevention, through food and other lifestyle changes. Medicine (such as prescription pills and surgeries) is often only a Band-Aid on the problem, not a long-term solution. Most common diseases of our time include obesity, type 2 diabetes, hypertension, heart disease, and cancer. The Pritikin Program is recommended and has been proven to help reduce, reverse, and/or prevent the damaging effects of metabolic  syndrome.  Nutrition   Overview of the Pritikin Eating Plan  Clinical staff conducted group or individual video education with verbal and written material and guidebook.  Patient learns about the Pritikin Eating Plan for disease risk reduction. The Pritikin Eating Plan emphasizes a wide variety of unrefined, minimally-processed carbohydrates, like fruits, vegetables, whole grains, and  legumes. Go, Caution, and Stop food choices are explained. Plant-based and lean animal proteins are emphasized. Rationale provided for low sodium intake for blood pressure control, low added sugars for blood sugar stabilization, and low added fats and oils for coronary artery disease risk reduction and weight management.  Calorie Density  Clinical staff conducted group or individual video education with verbal and written material and guidebook.  Patient learns about calorie density and how it impacts the Pritikin Eating Plan. Knowing the characteristics of the food you choose will help you decide whether those foods will lead to weight gain or weight loss, and whether you want to consume more or less of them. Weight loss is usually a side effect of the Pritikin Eating Plan because of its focus on low calorie-dense foods.  Label Reading  Clinical staff conducted group or individual video education with verbal and written material and guidebook.  Patient learns about the Pritikin recommended label reading guidelines and corresponding recommendations regarding calorie density, added sugars, sodium content, and whole grains.  Dining Out - Part 1  Clinical staff conducted group or individual video education with verbal and written material and guidebook.  Patient learns that restaurant meals can be sabotaging because they can be so high in calories, fat, sodium, and/or sugar. Patient learns recommended strategies on how to positively address this and avoid unhealthy pitfalls.  Facts on Fats  Clinical staff conducted  group or individual video education with verbal and written material and guidebook.  Patient learns that lifestyle modifications can be just as effective, if not more so, as many medications for lowering your risk of heart disease. A Pritikin lifestyle can help to reduce your risk of inflammation and atherosclerosis (cholesterol build-up, or plaque, in the artery walls). Lifestyle interventions such as dietary choices and physical activity address the cause of atherosclerosis. A review of the types of fats and their impact on blood cholesterol levels, along with dietary recommendations to reduce fat intake is also included.  Nutrition Action Plan  Clinical staff conducted group or individual video education with verbal and written material and guidebook.  Patient learns how to incorporate Pritikin recommendations into their lifestyle. Recommendations include planning and keeping personal health goals in mind as an important part of their success.  Healthy Mind-Set    Healthy Minds, Bodies, Hearts  Clinical staff conducted group or individual video education with verbal and written material and guidebook.  Patient learns how to identify when they are stressed. Video will discuss the impact of that stress, as well as the many benefits of stress management. Patient will also be introduced to stress management techniques. The way we think, act, and feel has an impact on our hearts.  How Our Thoughts Can Heal Our Hearts  Clinical staff conducted group or individual video education with verbal and written material and guidebook.  Patient learns that negative thoughts can cause depression and anxiety. This can result in negative lifestyle behavior and serious health problems. Cognitive behavioral therapy is an effective method to help control our thoughts in order to change and improve our emotional outlook.  Additional Videos:  Exercise    Improving Performance  Clinical staff conducted group or  individual video education with verbal and written material and guidebook.  Patient learns to use a non-linear approach by alternating intensity levels and lengths of time spent exercising to help burn more calories and lose more body fat. Cardiovascular exercise helps improve heart health, metabolism, hormonal balance, blood sugar control, and recovery from  fatigue. Resistance training improves strength, endurance, balance, coordination, reaction time, metabolism, and muscle mass. Flexibility exercise improves circulation, posture, and balance. Seek guidance from your physician and exercise physiologist before implementing an exercise routine and learn your capabilities and proper form for all exercise.  Introduction to Yoga  Clinical staff conducted group or individual video education with verbal and written material and guidebook.  Patient learns about yoga, a discipline of the coming together of mind, breath, and body. The benefits of yoga include improved flexibility, improved range of motion, better posture and core strength, increased lung function, weight loss, and positive self-image. Yoga's heart health benefits include lowered blood pressure, healthier heart rate, decreased cholesterol and triglyceride levels, improved immune function, and reduced stress. Seek guidance from your physician and exercise physiologist before implementing an exercise routine and learn your capabilities and proper form for all exercise.  Medical   Aging: Enhancing Your Quality of Life  Clinical staff conducted group or individual video education with verbal and written material and guidebook.  Patient learns key strategies and recommendations to stay in good physical health and enhance quality of life, such as prevention strategies, having an advocate, securing a Health Care Proxy and Power of Attorney, and keeping a list of medications and system for tracking them. It also discusses how to avoid risk for bone  loss.  Biology of Weight Control  Clinical staff conducted group or individual video education with verbal and written material and guidebook.  Patient learns that weight gain occurs because we consume more calories than we burn (eating more, moving less). Even if your body weight is normal, you may have higher ratios of fat compared to muscle mass. Too much body fat puts you at increased risk for cardiovascular disease, heart attack, stroke, type 2 diabetes, and obesity-related cancers. In addition to exercise, following the Pritikin Eating Plan can help reduce your risk.  Decoding Lab Results  Clinical staff conducted group or individual video education with verbal and written material and guidebook.  Patient learns that lab test reflects one measurement whose values change over time and are influenced by many factors, including medication, stress, sleep, exercise, food, hydration, pre-existing medical conditions, and more. It is recommended to use the knowledge from this video to become more involved with your lab results and evaluate your numbers to speak with your doctor.   Diseases of Our Time - Overview  Clinical staff conducted group or individual video education with verbal and written material and guidebook.  Patient learns that according to the CDC, 50% to 70% of chronic diseases (such as obesity, type 2 diabetes, elevated lipids, hypertension, and heart disease) are avoidable through lifestyle improvements including healthier food choices, listening to satiety cues, and increased physical activity.  Sleep Disorders Clinical staff conducted group or individual video education with verbal and written material and guidebook.  Patient learns how good quality and duration of sleep are important to overall health and well-being. Patient also learns about sleep disorders and how they impact health along with recommendations to address them, including discussing with a physician.  Nutrition   Dining Out - Part 2 Clinical staff conducted group or individual video education with verbal and written material and guidebook.  Patient learns how to plan ahead and communicate in order to maximize their dining experience in a healthy and nutritious manner. Included are recommended food choices based on the type of restaurant the patient is visiting.   Fueling a Banker conducted group or  individual video education with verbal and written material and guidebook.  There is a strong connection between our food choices and our health. Diseases like obesity and type 2 diabetes are very prevalent and are in large-part due to lifestyle choices. The Pritikin Eating Plan provides plenty of food and hunger-curbing satisfaction. It is easy to follow, affordable, and helps reduce health risks.  Menu Workshop  Clinical staff conducted group or individual video education with verbal and written material and guidebook.  Patient learns that restaurant meals can sabotage health goals because they are often packed with calories, fat, sodium, and sugar. Recommendations include strategies to plan ahead and to communicate with the manager, chef, or server to help order a healthier meal.  Planning Your Eating Strategy  Clinical staff conducted group or individual video education with verbal and written material and guidebook.  Patient learns about the Pritikin Eating Plan and its benefit of reducing the risk of disease. The Pritikin Eating Plan does not focus on calories. Instead, it emphasizes high-quality, nutrient-rich foods. By knowing the characteristics of the foods, we choose, we can determine their calorie density and make informed decisions.  Targeting Your Nutrition Priorities  Clinical staff conducted group or individual video education with verbal and written material and guidebook.  Patient learns that lifestyle habits have a tremendous impact on disease risk and progression. This  video provides eating and physical activity recommendations based on your personal health goals, such as reducing LDL cholesterol, losing weight, preventing or controlling type 2 diabetes, and reducing high blood pressure.  Vitamins and Minerals  Clinical staff conducted group or individual video education with verbal and written material and guidebook.  Patient learns different ways to obtain key vitamins and minerals, including through a recommended healthy diet. It is important to discuss all supplements you take with your doctor.   Healthy Mind-Set    Smoking Cessation  Clinical staff conducted group or individual video education with verbal and written material and guidebook.  Patient learns that cigarette smoking and tobacco addiction pose a serious health risk which affects millions of people. Stopping smoking will significantly reduce the risk of heart disease, lung disease, and many forms of cancer. Recommended strategies for quitting are covered, including working with your doctor to develop a successful plan.  Culinary   Becoming a Set designer conducted group or individual video education with verbal and written material and guidebook.  Patient learns that cooking at home can be healthy, cost-effective, quick, and puts them in control. Keys to cooking healthy recipes will include looking at your recipe, assessing your equipment needs, planning ahead, making it simple, choosing cost-effective seasonal ingredients, and limiting the use of added fats, salts, and sugars.  Cooking - Breakfast and Snacks  Clinical staff conducted group or individual video education with verbal and written material and guidebook.  Patient learns how important breakfast is to satiety and nutrition through the entire day. Recommendations include key foods to eat during breakfast to help stabilize blood sugar levels and to prevent overeating at meals later in the day. Planning ahead is also a  key component.  Cooking - Educational psychologist conducted group or individual video education with verbal and written material and guidebook.  Patient learns eating strategies to improve overall health, including an approach to cook more at home. Recommendations include thinking of animal protein as a side on your plate rather than center stage and focusing instead on lower calorie dense options like vegetables,  fruits, whole grains, and plant-based proteins, such as beans. Making sauces in large quantities to freeze for later and leaving the skin on your vegetables are also recommended to maximize your experience.  Cooking - Healthy Salads and Dressing Clinical staff conducted group or individual video education with verbal and written material and guidebook.  Patient learns that vegetables, fruits, whole grains, and legumes are the foundations of the Pritikin Eating Plan. Recommendations include how to incorporate each of these in flavorful and healthy salads, and how to create homemade salad dressings. Proper handling of ingredients is also covered. Cooking - Soups and [employer] - Soups and Desserts Clinical staff conducted group or individual video education with verbal and written material and guidebook.  Patient learns that Pritikin soups and desserts make for easy, nutritious, and delicious snacks and meal components that are low in sodium, fat, sugar, and calorie density, while high in vitamins, minerals, and filling fiber. Recommendations include simple and healthy ideas for soups and desserts.   Overview     The Pritikin Solution Program Overview Clinical staff conducted group or individual video education with verbal and written material and guidebook.  Patient learns that the results of the Pritikin Program have been documented in more than 100 articles published in peer-reviewed journals, and the benefits include reducing risk factors for (and, in some cases, even  reversing) high cholesterol, high blood pressure, type 2 diabetes, obesity, and more! An overview of the three key pillars of the Pritikin Program will be covered: eating well, doing regular exercise, and having a healthy mind-set.  WORKSHOPS  Exercise: Exercise Basics: Building Your Action Plan Clinical staff led group instruction and group discussion with PowerPoint presentation and patient guidebook. To enhance the learning environment the use of posters, models and videos may be added. At the conclusion of this workshop, patients will comprehend the difference between physical activity and exercise, as well as the benefits of incorporating both, into their routine. Patients will understand the FITT (Frequency, Intensity, Time, and Type) principle and how to use it to build an exercise action plan. In addition, safety concerns and other considerations for exercise and cardiac rehab will be addressed by the presenter. The purpose of this lesson is to promote a comprehensive and effective weekly exercise routine in order to improve patients' overall level of fitness.   Managing Heart Disease: Your Path to a Healthier Heart Clinical staff led group instruction and group discussion with PowerPoint presentation and patient guidebook. To enhance the learning environment the use of posters, models and videos may be added.At the conclusion of this workshop, patients will understand the anatomy and physiology of the heart. Additionally, they will understand how Pritikin's three pillars impact the risk factors, the progression, and the management of heart disease.  The purpose of this lesson is to provide a high-level overview of the heart, heart disease, and how the Pritikin lifestyle positively impacts risk factors.  Exercise Biomechanics Clinical staff led group instruction and group discussion with PowerPoint presentation and patient guidebook. To enhance the learning environment the use of  posters, models and videos may be added. Patients will learn how the structural parts of their bodies function and how these functions impact their daily activities, movement, and exercise. Patients will learn how to promote a neutral spine, learn how to manage pain, and identify ways to improve their physical movement in order to promote healthy living. The purpose of this lesson is to expose patients to common physical limitations that impact physical  activity. Participants will learn practical ways to adapt and manage aches and pains, and to minimize their effect on regular exercise. Patients will learn how to maintain good posture while sitting, walking, and lifting.  Balance Training and Fall Prevention  Clinical staff led group instruction and group discussion with PowerPoint presentation and patient guidebook. To enhance the learning environment the use of posters, models and videos may be added. At the conclusion of this workshop, patients will understand the importance of their sensorimotor skills (vision, proprioception, and the vestibular system) in maintaining their ability to balance as they age. Patients will apply a variety of balancing exercises that are appropriate for their current level of function. Patients will understand the common causes for poor balance, possible solutions to these problems, and ways to modify their physical environment in order to minimize their fall risk. The purpose of this lesson is to teach patients about the importance of maintaining balance as they age and ways to minimize their risk of falling.  WORKSHOPS   Nutrition:  Fueling a Ship broker led group instruction and group discussion with PowerPoint presentation and patient guidebook. To enhance the learning environment the use of posters, models and videos may be added. Patients will review the foundational principles of the Pritikin Eating Plan and understand what constitutes a  serving size in each of the food groups. Patients will also learn Pritikin-friendly foods that are better choices when away from home and review make-ahead meal and snack options. Calorie density will be reviewed and applied to three nutrition priorities: weight maintenance, weight loss, and weight gain. The purpose of this lesson is to reinforce (in a group setting) the key concepts around what patients are recommended to eat and how to apply these guidelines when away from home by planning and selecting Pritikin-friendly options. Patients will understand how calorie density may be adjusted for different weight management goals.  Mindful Eating  Clinical staff led group instruction and group discussion with PowerPoint presentation and patient guidebook. To enhance the learning environment the use of posters, models and videos may be added. Patients will briefly review the concepts of the Pritikin Eating Plan and the importance of low-calorie dense foods. The concept of mindful eating will be introduced as well as the importance of paying attention to internal hunger signals. Triggers for non-hunger eating and techniques for dealing with triggers will be explored. The purpose of this lesson is to provide patients with the opportunity to review the basic principles of the Pritikin Eating Plan, discuss the value of eating mindfully and how to measure internal cues of hunger and fullness using the Hunger Scale. Patients will also discuss reasons for non-hunger eating and learn strategies to use for controlling emotional eating.  Targeting Your Nutrition Priorities Clinical staff led group instruction and group discussion with PowerPoint presentation and patient guidebook. To enhance the learning environment the use of posters, models and videos may be added. Patients will learn how to determine their genetic susceptibility to disease by reviewing their family history. Patients will gain insight into the  importance of diet as part of an overall healthy lifestyle in mitigating the impact of genetics and other environmental insults. The purpose of this lesson is to provide patients with the opportunity to assess their personal nutrition priorities by looking at their family history, their own health history and current risk factors. Patients will also be able to discuss ways of prioritizing and modifying the Pritikin Eating Plan for their highest risk areas  Menu  Clinical staff led group instruction and group discussion with PowerPoint presentation and patient guidebook. To enhance the learning environment the use of posters, models and videos may be added. Using menus brought in from [employer], or printed from [employer], patients will apply the Pritikin dining out guidelines that were presented in the [employer] video. Patients will also be able to practice these guidelines in a variety of provided scenarios. The purpose of this lesson is to provide patients with the opportunity to practice hands-on learning of the Pritikin Dining Out guidelines with actual menus and practice scenarios.  Label Reading Clinical staff led group instruction and group discussion with PowerPoint presentation and patient guidebook. To enhance the learning environment the use of posters, models and videos may be added. Patients will review and discuss the Pritikin label reading guidelines presented in Pritikin's Label Reading Educational series video. Using fool labels brought in from local grocery stores and markets, patients will apply the label reading guidelines and determine if the packaged food meet the Pritikin guidelines. The purpose of this lesson is to provide patients with the opportunity to review, discuss, and practice hands-on learning of the Pritikin Label Reading guidelines with actual packaged food labels. Cooking School  Pritikin's [employer] are designed to teach  patients ways to prepare quick, simple, and affordable recipes at home. The importance of nutrition's role in chronic disease risk reduction is reflected in its emphasis in the overall Pritikin program. By learning how to prepare essential core Pritikin Eating Plan recipes, patients will increase control over what they eat; be able to customize the flavor of foods without the use of added salt, sugar, or fat; and improve the quality of the food they consume. By learning a set of core recipes which are easily assembled, quickly prepared, and affordable, patients are more likely to prepare more healthy foods at home. These workshops focus on convenient breakfasts, simple entres, side dishes, and desserts which can be prepared with minimal effort and are consistent with nutrition recommendations for cardiovascular risk reduction. Cooking [employer] are taught by a Armed forces logistics/support/administrative officer (RD) who has been trained by the [employer]. The chef or RD has a clear understanding of the importance of minimizing - if not completely eliminating - added fat, sugar, and sodium in recipes. Throughout the series of Cooking School Workshop sessions, patients will learn about healthy ingredients and efficient methods of cooking to build confidence in their capability to prepare    Cooking School weekly topics:  Adding Flavor- Sodium-Free  Fast and Healthy Breakfasts  Powerhouse Plant-Based Proteins  Satisfying Salads and Dressings  Simple Sides and Sauces  International Cuisine-Spotlight on the [employer] Zones  Delicious Desserts  Savory Soups  [employer] - Meals in a Astronomer Appetizers and Snacks  Comforting Weekend Breakfasts  One-Pot Wonders   Fast Evening Meals  Landscape architect Your Pritikin Plate  WORKSHOPS   Healthy Mindset (Psychosocial):  Focused Goals, Sustainable Changes Clinical staff led group instruction and group discussion with PowerPoint  presentation and patient guidebook. To enhance the learning environment the use of posters, models and videos may be added. Patients will be able to apply effective goal setting strategies to establish at least one personal goal, and then take consistent, meaningful action toward that goal. They will learn to identify common barriers to achieving personal goals and develop strategies to overcome them. Patients will also gain an understanding of how our  mind-set can impact our ability to achieve goals and the importance of cultivating a positive and growth-oriented mind-set. The purpose of this lesson is to provide patients with a deeper understanding of how to set and achieve personal goals, as well as the tools and strategies needed to overcome common obstacles which may arise along the way.  From Head to Heart: The Power of a Healthy Outlook  Clinical staff led group instruction and group discussion with PowerPoint presentation and patient guidebook. To enhance the learning environment the use of posters, models and videos may be added. Patients will be able to recognize and describe the impact of emotions and mood on physical health. They will discover the importance of self-care and explore self-care practices which may work for them. Patients will also learn how to utilize the 4 C's to cultivate a healthier outlook and better manage stress and challenges. The purpose of this lesson is to demonstrate to patients how a healthy outlook is an essential part of maintaining good health, especially as they continue their cardiac rehab journey.  Healthy Sleep for a Healthy Heart Clinical staff led group instruction and group discussion with PowerPoint presentation and patient guidebook. To enhance the learning environment the use of posters, models and videos may be added. At the conclusion of this workshop, patients will be able to demonstrate knowledge of the importance of sleep to overall health, well-being,  and quality of life. They will understand the symptoms of, and treatments for, common sleep disorders. Patients will also be able to identify daytime and nighttime behaviors which impact sleep, and they will be able to apply these tools to help manage sleep-related challenges. The purpose of this lesson is to provide patients with a general overview of sleep and outline the importance of quality sleep. Patients will learn about a few of the most common sleep disorders. Patients will also be introduced to the concept of "sleep hygiene," and discover ways to self-manage certain sleeping problems through simple daily behavior changes. Finally, the workshop will motivate patients by clarifying the links between quality sleep and their goals of heart-healthy living.   Recognizing and Reducing Stress Clinical staff led group instruction and group discussion with PowerPoint presentation and patient guidebook. To enhance the learning environment the use of posters, models and videos may be added. At the conclusion of this workshop, patients will be able to understand the types of stress reactions, differentiate between acute and chronic stress, and recognize the impact that chronic stress has on their health. They will also be able to apply different coping mechanisms, such as reframing negative self-talk. Patients will have the opportunity to practice a variety of stress management techniques, such as deep abdominal breathing, progressive muscle relaxation, and/or guided imagery.  The purpose of this lesson is to educate patients on the role of stress in their lives and to provide healthy techniques for coping with it.  Learning Barriers/Preferences:  Learning Barriers/Preferences - 12/13/22 1427       Learning Barriers/Preferences   Learning Barriers Sight    Learning Preferences Audio;Computer/Internet;Group Instruction;Individual Instruction;Pictoral;Skilled Demonstration;Verbal Instruction;Video;Written  Material             Education Topics:  Knowledge Questionnaire Score:  Knowledge Questionnaire Score - 12/13/22 1428       Knowledge Questionnaire Score   Pre Score 21/24             Core Components/Risk Factors/Patient Goals at Admission:  Personal Goals and Risk Factors at Admission - 12/13/22 1428  Core Components/Risk Factors/Patient Goals on Admission   Heart Failure Yes    Intervention Provide a combined exercise and nutrition program that is supplemented with education, support and counseling about heart failure. Directed toward relieving symptoms such as shortness of breath, decreased exercise tolerance, and extremity edema.    Expected Outcomes Improve functional capacity of life;Short term: Attendance in program 2-3 days a week with increased exercise capacity. Reported lower sodium intake. Reported increased fruit and vegetable intake. Reports medication compliance.;Short term: Daily weights obtained and reported for increase. Utilizing diuretic protocols set by physician.;Long term: Adoption of self-care skills and reduction of barriers for early signs and symptoms recognition and intervention leading to self-care maintenance.    Hypertension Yes    Intervention Provide education on lifestyle modifcations including regular physical activity/exercise, weight management, moderate sodium restriction and increased consumption of fresh fruit, vegetables, and low fat dairy, alcohol moderation, and smoking cessation.;Monitor prescription use compliance.    Expected Outcomes Short Term: Continued assessment and intervention until BP is < 140/85mm HG in hypertensive participants. < 130/20mm HG in hypertensive participants with diabetes, heart failure or chronic kidney disease.;Long Term: Maintenance of blood pressure at goal levels.    Lipids Yes    Intervention Provide education and support for participant on nutrition & aerobic/resistive exercise along with prescribed  medications to achieve LDL 70mg , HDL >40mg .    Expected Outcomes Short Term: Participant states understanding of desired cholesterol values and is compliant with medications prescribed. Participant is following exercise prescription and nutrition guidelines.;Long Term: Cholesterol controlled with medications as prescribed, with individualized exercise RX and with personalized nutrition plan. Value goals: LDL < 70mg , HDL > 40 mg.    Stress Yes    Intervention Offer individual and/or small group education and counseling on adjustment to heart disease, stress management and health-related lifestyle change. Teach and support self-help strategies.;Refer participants experiencing significant psychosocial distress to appropriate mental health specialists for further evaluation and treatment. When possible, include family members and significant others in education/counseling sessions.    Expected Outcomes Short Term: Participant demonstrates changes in health-related behavior, relaxation and other stress management skills, ability to obtain effective social support, and compliance with psychotropic medications if prescribed.;Long Term: Emotional wellbeing is indicated by absence of clinically significant psychosocial distress or social isolation.             Core Components/Risk Factors/Patient Goals Review:    Core Components/Risk Factors/Patient Goals at Discharge (Final Review):    ITP Comments:  ITP Comments     Row Name 12/13/22 1411           ITP Comments Armanda Magic, MD:  Medical Director. Intoduction to the [facility] Cardiac Rehab.  Initial orientation packet reviewed with the patient.                Comments: Participant attended orientation for the cardiac rehabilitation program on  12/13/2022  to perform initial intake and exercise walk test. Patient introduced to the [facility] education and orientation packet was reviewed. Completed 6-minute  walk test, measurements, initial ITP, and exercise prescription. Vital signs stable. Telemetry-AV paced rhythm, asymptomatic.   Service time was from 10:32 to 1:20.

## 2022-12-13 NOTE — Progress Notes (Signed)
Cardiac Rehab Medication Review   Does the patient  feel that his/her medications are working for him/her?  yes  Has the patient been experiencing any side effects to the medications prescribed?  no  Does the patient measure his/her own blood pressure or blood glucose at home?  yes   Does the patient have any problems obtaining medications due to transportation or finances?   no  Understanding of regimen: fair Understanding of indications: fair Potential of compliance: fair    Comments: Patient's spouse deals with the medication and has good knowledge about them, the patient does not. Pt checks blood pressures at home daily.     Lorin Picket 12/13/2022 11:20 AM

## 2022-12-19 ENCOUNTER — Encounter (HOSPITAL_COMMUNITY): Payer: BLUE CROSS/BLUE SHIELD

## 2022-12-21 ENCOUNTER — Encounter (HOSPITAL_COMMUNITY)
Admission: RE | Admit: 2022-12-21 | Discharge: 2022-12-21 | Disposition: A | Discharge status: Home / Self Care | Payer: BLUE CROSS/BLUE SHIELD | Source: Ambulatory Visit | Attending: Cardiology | Admitting: Cardiology

## 2022-12-21 DIAGNOSIS — Z5189 Encounter for other specified aftercare: Secondary | ICD-10-CM | POA: Diagnosis not present

## 2022-12-21 DIAGNOSIS — I5022 Chronic systolic (congestive) heart failure: Secondary | ICD-10-CM

## 2022-12-21 NOTE — Progress Notes (Signed)
Daily Session Note  Patient Details  Name: Stephen Hendricks MRN: [id number] Date of Birth: [birth year] Referring Provider:   Flowsheet Row CARDIAC REHAB PHASE II ORIENTATION from 12/13/2022 in [facility]  Referring Provider Dr. Sullivan Lone ([name], MD covering)       Encounter Date: 12/21/2022  Check In:  Session Check In - 12/21/22 1256       Check-In   Supervising physician immediately available to respond to emergencies [facility] MD immediately available    Physician(s) Bernadene Person NP    Location [facility]    Staff Present Valinda Party, MS, Exercise Physiologist;Jetta Dan Humphreys BS, ACSM-CEP, Exercise Physiologist;Olinty Peggye Pitt, MS, ACSM-CEP, Exercise Physiologist;David Makemson, MS, ACSM-CEP, CCRP, Exercise Physiologist;Latrice Storlie, RN, BSN    Virtual Visit No    Medication changes reported     No    Fall or balance concerns reported    No    Tobacco Cessation No Change    Warm-up and Cool-down Performed as group-led instruction    Resistance Training Performed No    VAD Patient? No    PAD/SET Patient? No      Pain Assessment   Currently in Pain? No/denies    Pain Score 0-No pain    Pain Onset 1 to 4 weeks ago    Multiple Pain Sites No             Capillary Blood Glucose: No results found for this or any previous visit (from the past 24 hours).   Exercise Prescription Changes - 12/21/22 1400       Response to Exercise   Blood Pressure (Admit) 104/72    Blood Pressure (Exercise) 104/70    Blood Pressure (Exit) 100/60    Heart Rate (Admit) 77 bpm    Heart Rate (Exercise) 111 bpm    Heart Rate (Exit) 70 bpm    Rating of Perceived Exertion (Exercise) 13    Symptoms None    Comments Pt's first day in the [facility] program    Duration Progress to 30 minutes of  aerobic without signs/symptoms of physical distress    Intensity THRR unchanged      Progression   Progression Continue to  progress workloads to maintain intensity without signs/symptoms of physical distress.    Average METs 1.9      Resistance Training   Training Prescription No    Weight No weights on wednesdays      Interval Training   Interval Training No      NuStep   Level 1    SPM 67    Minutes 20    METs 1.9             Social History   Tobacco Use  Smoking Status Former   Current packs/day: 0.00   Types: Cigarettes   Start date: 01/04/1956   Quit date: 01/04/1991   Years since quitting: 31.9  Smokeless Tobacco Never    Goals Met:  Exercise tolerated well No report of concerns or symptoms today  Goals Unmet:  Not Applicable  Comments: Pt started cardiac rehab today.  Pt tolerated light exercise without difficulty. VSS, telemetry-AV paced, asymptomatic.  Medication list reconciled. Pt denies barriers to medicaiton compliance.  PSYCHOSOCIAL ASSESSMENT:  PHQ-9. Pt exhibits positive coping skills, hopeful outlook with supportive family. No psychosocial needs identified at this time, no psychosocial interventions necessary.    Pt enjoys reading science and watching tennis.   Pt oriented to  exercise equipment and routine.    Understanding verbalized. Thayer Headings RN BSN    Dr. Armanda Magic is Medical Director for Cardiac Rehab at [facility].

## 2022-12-23 ENCOUNTER — Encounter (HOSPITAL_COMMUNITY)
Admission: RE | Admit: 2022-12-23 | Discharge: 2022-12-23 | Disposition: A | Discharge status: Home / Self Care | Payer: Medicaid Other | Source: Ambulatory Visit | Attending: Cardiology | Admitting: Cardiology

## 2022-12-23 DIAGNOSIS — Z5189 Encounter for other specified aftercare: Secondary | ICD-10-CM | POA: Diagnosis not present

## 2022-12-23 DIAGNOSIS — I5022 Chronic systolic (congestive) heart failure: Secondary | ICD-10-CM

## 2022-12-26 ENCOUNTER — Encounter (HOSPITAL_COMMUNITY)
Admission: RE | Admit: 2022-12-26 | Discharge: 2022-12-26 | Disposition: A | Discharge status: Home / Self Care | Payer: BLUE CROSS/BLUE SHIELD | Source: Ambulatory Visit | Attending: Cardiology | Admitting: Cardiology

## 2022-12-26 DIAGNOSIS — I5022 Chronic systolic (congestive) heart failure: Secondary | ICD-10-CM

## 2022-12-26 DIAGNOSIS — Z5189 Encounter for other specified aftercare: Secondary | ICD-10-CM | POA: Diagnosis not present

## 2022-12-30 ENCOUNTER — Encounter (HOSPITAL_COMMUNITY): Payer: BLUE CROSS/BLUE SHIELD

## 2023-01-02 ENCOUNTER — Encounter (HOSPITAL_COMMUNITY)
Admission: RE | Admit: 2023-01-02 | Discharge: 2023-01-02 | Disposition: A | Discharge status: Home / Self Care | Payer: BLUE CROSS/BLUE SHIELD | Source: Ambulatory Visit | Attending: Cardiology | Admitting: Cardiology

## 2023-01-02 DIAGNOSIS — Z5189 Encounter for other specified aftercare: Secondary | ICD-10-CM | POA: Diagnosis not present

## 2023-01-02 DIAGNOSIS — I5022 Chronic systolic (congestive) heart failure: Secondary | ICD-10-CM

## 2023-01-03 ENCOUNTER — Telehealth (HOSPITAL_COMMUNITY): Payer: Self-pay

## 2023-01-06 ENCOUNTER — Encounter (HOSPITAL_COMMUNITY)
Admission: RE | Admit: 2023-01-06 | Discharge: 2023-01-06 | Disposition: A | Discharge status: Home / Self Care | Payer: Medicare Other | Source: Ambulatory Visit | Attending: Cardiology | Admitting: Cardiology

## 2023-01-06 ENCOUNTER — Encounter (HOSPITAL_COMMUNITY): Payer: Medicare Other

## 2023-01-06 DIAGNOSIS — I5022 Chronic systolic (congestive) heart failure: Secondary | ICD-10-CM | POA: Insufficient documentation

## 2023-01-06 NOTE — Progress Notes (Signed)
 QUALITY OF LIFE SCORE REVIEW  Pt completed Quality of Life survey as a participant in Cardiac Rehab.  Scores 21.0 or below are considered low.  Pt score very low in several areas Overall 18.38, Health and Function 16.57, socioeconomic 16.75, physiological and spiritual 18.93, family 25.3. Patient quality of life slightly altered by physical constraints which limits ability to perform as prior to recent cardiac illness. Stephen Hendricks admits to having some depression as he lost his eldest daughter and is worried about [place] in his [place]. Stephen Hendricks says his depression is manageable. Stephen Hendricks says he has a strong faith and belief in God. Stephen Hendricks does not want this review forwarded to his primary care provider at this time.   Offered emotional support and reassurance.  Will continue to monitor and intervene as necessary. Hadassah Elpidio Quan RN BSN

## 2023-01-09 ENCOUNTER — Encounter (HOSPITAL_COMMUNITY): Payer: Medicare Other

## 2023-01-10 NOTE — Progress Notes (Signed)
 Cardiac Individual Treatment Plan  Patient Details  Name: Stephen Hendricks MRN: [id number] Date of Birth: 1940-03-12 Referring Provider:   Flowsheet Row CARDIAC REHAB PHASE II ORIENTATION from 12/13/2022 in [facility]  Referring Provider Stephen. Bertrum (Wilbert Holland, MD covering)       Initial Encounter Date:  Flowsheet Row CARDIAC REHAB PHASE II ORIENTATION from 12/13/2022 in [facility]  Date 12/13/22       Visit Diagnosis: Heart failure, chronic systolic (HCC)  Patient's Home Medications on Admission:  Current Outpatient Medications:    apixaban  (ELIQUIS ) 5 MG TABS tablet, Take 1 tablet (5 mg total) by mouth 2 (two) times daily., Disp: 60 tablet, Rfl: 5   benzonatate (TESSALON) 100 MG capsule, Take 100 mg by mouth 3 (three) times daily. Take 1 to 2 capsules by mouth up to 3 times daily as needed for cough., Disp: , Rfl:    Cholecalciferol 25 MCG (1000 UT) TBDP, Take 25 mcg by mouth daily. 1,000 units, Disp: , Rfl:    eplerenone (INSPRA) 25 MG tablet, Take 25 mg by mouth daily., Disp: , Rfl:    famotidine  (PEPCID ) 20 MG tablet, TAKE 1 TABLET (20 MG TOTAL) BY MOUTH 2 (TWO) TIMES DAILY. (Patient not taking: Reported on 12/23/2022), Disp: 300 tablet, Rfl: 0   FARXIGA 10 MG TABS tablet, Take 10 mg by mouth daily., Disp: , Rfl:    finasteride  (PROSCAR ) 5 MG tablet, Take 1 tablet (5 mg total) by mouth daily., Disp: 150 tablet, Rfl: 0   Flaxseed, Linseed, (FLAXSEED OIL PO), Take 1 capsule by mouth daily., Disp: , Rfl:    GARLIC PO, Take 1,000 mg by mouth once as needed. Pt takes a 5,000 mg tablet - 1 tablet every day., Disp: , Rfl:    losartan  (COZAAR ) 25 MG tablet, Take 1 tablet (25 mg total) by mouth daily., Disp: 90 tablet, Rfl: 3   magnesium oxide (MAG-OX) 400 MG tablet, Take 1 tablet by mouth 2 (two) times daily., Disp: , Rfl:    metoprolol succinate (TOPROL-XL) 25 MG  24 hr tablet, Take 12.5 mg by mouth daily., Disp: , Rfl:    nitroGLYCERIN (NITROSTAT) 0.4 MG SL tablet, Place 0.4 mg under the tongue every 5 (five) minutes as needed., Disp: , Rfl:    omeprazole (PRILOSEC) 20 MG capsule, Take 20 mg by mouth 2 (two) times daily before a meal., Disp: , Rfl:    rosuvastatin (CRESTOR) 10 MG tablet, Take 1 tablet by mouth daily., Disp: , Rfl:    tamsulosin  (FLOMAX ) 0.4 MG CAPS capsule, Take 2 capsules (0.8 mg total) by mouth daily after supper. (Patient taking differently: Take 0.4 mg by mouth daily after supper. Takes one capsule daily), Disp: 300 capsule, Rfl: 0   torsemide (DEMADEX) 10 MG tablet, Take 10 mg by mouth daily as needed., Disp: , Rfl:    Travoprost, BAK Free, (TRAVATAN) 0.004 % SOLN ophthalmic solution, Place 1 drop into the right eye at bedtime., Disp: , Rfl:   Past Medical History: Past Medical History:  Diagnosis Date   Acute pulmonary embolism (HCC) 12/03/2015   Bilateral lower lobe pulmonary emboli 11/2015.   BPH (benign prostatic hyperplasia)    Cataract of right eye    CHF (congestive heart failure) (HCC)    Frozen shoulder    HTN (hypertension)    Hx of prostatitis 3.20.16   WITH E.COLI   LBBB (left bundle  branch block)    Leishmaniasis    [place]   [place]   Oral candidiasis    [place]   Right tennis elbow    SOB (shortness of breath)     Tobacco Use: Social History   Tobacco Use  Smoking Status Former   Current packs/day: 0.00   Types: Cigarettes   Start date: 01/04/1956   Quit date: 01/04/1991   Years since quitting: 32.0  Smokeless Tobacco Never    Labs: Review Flowsheet       Latest Ref Rng & Units 09/11/2014 09/29/2014 11/14/2014  Labs for ITP Cardiac and Pulmonary Rehab  Cholestrol 125 - 200 mg/dL - 778  867   LDL (calc) <130 mg/dL - 875  49   HDL-C >=59 mg/dL - 67  70   Trlycerides <150 mg/dL - 848  66   Hemoglobin A1c - 5.1  - -    Capillary Blood Glucose: Lab Results   Component Value Date   GLUCAP 91 09/11/2014     Exercise Target Goals: Exercise Program Goal: Individual exercise prescription set using results from initial 6 min walk test and THRR while considering  patient's activity barriers and safety.   Exercise Prescription Goal: Initial exercise prescription builds to 30-45 minutes a day of aerobic activity, 2-3 days per week.  Home exercise guidelines will be given to patient during program as part of exercise prescription that the participant will acknowledge.  Activity Barriers & Risk Stratification:  Activity Barriers & Cardiac Risk Stratification - 12/13/22 1414       Activity Barriers & Cardiac Risk Stratification   Activity Barriers Balance Concerns;Arthritis;Joint Problems;History of Falls;Back Problems;Deconditioning;Assistive Device;Muscular Weakness;Left Knee Replacement;Decreased Ventricular Function    Cardiac Risk Stratification High             6 Minute Walk:  6 Minute Walk     Row Name 12/13/22 1220         6 Minute Walk   Phase Initial     Distance 720 feet     Walk Time 6 minutes     # of Rest Breaks 1  2:28-3:19 (Pt was over THR)     MPH 1.4     METS 1.7     RPE 11     Perceived Dyspnea  0     VO2 Peak 6     Symptoms No     Resting HR 66 bpm     Resting BP 101/62     Resting Oxygen Saturation  97 %     Exercise Oxygen Saturation  during 6 min walk 97 %     Max Ex. HR 119 bpm     Max Ex. BP 125/80     2 Minute Post BP 125/81              Oxygen Initial Assessment:   Oxygen Re-Evaluation:   Oxygen Discharge (Final Oxygen Re-Evaluation):   Initial Exercise Prescription:  Initial Exercise Prescription - 12/13/22 1400       Date of Initial Exercise RX and Referring Provider   Date 12/13/22    Referring Provider Stephen. Bertrum Earley Holland, MD covering)    Expected Discharge Date 03/08/22      NuStep   Level 1    SPM 75    Minutes 25    METs 1.7      Prescription Details    Frequency (times per week) 3  Duration Progress to 30 minutes of continuous aerobic without signs/symptoms of physical distress      Intensity   THRR 40-80% of Max Heartrate 56-111    Ratings of Perceived Exertion 11-13    Perceived Dyspnea 0-4      Progression   Progression Continue progressive overload as per policy without signs/symptoms or physical distress.      Resistance Training   Training Prescription Yes    Weight 2 lbs    Reps 10-15             Perform Capillary Blood Glucose checks as needed.  Exercise Prescription Changes:   Exercise Prescription Changes     Row Name 12/21/22 1400 01/06/23 1600           Response to Exercise   Blood Pressure (Admit) 104/72 100/58      Blood Pressure (Exercise) 104/70 106/62      Blood Pressure (Exit) 100/60 94/54      Heart Rate (Admit) 77 bpm 76 bpm      Heart Rate (Exercise) 111 bpm 119 bpm      Heart Rate (Exit) 70 bpm 72 bpm      Rating of Perceived Exertion (Exercise) 13 11      Symptoms None None      Comments Pt's first day in the CRP2 program Reviewed METs      Duration Progress to 30 minutes of  aerobic without signs/symptoms of physical distress Progress to 30 minutes of  aerobic without signs/symptoms of physical distress      Intensity THRR unchanged THRR unchanged        Progression   Progression Continue to progress workloads to maintain intensity without signs/symptoms of physical distress. Continue to progress workloads to maintain intensity without signs/symptoms of physical distress.      Average METs 1.9 1.9        Resistance Training   Training Prescription No Yes      Weight No weights on wednesdays 2 lbs      Reps -- 10-15      Time -- 10 Minutes        Interval Training   Interval Training No No        NuStep   Level 1 1      SPM 67 79      Minutes 20 25      METs 1.9 2.1               Exercise Comments:   Exercise Comments     Row Name 12/21/22 1414 01/06/23 1600          Exercise Comments Pt's first day in the CRP2 program. Pt completed 20 minutes on Nustep with no complaints. Starting with low level intensity/duration due to patients deconditioned status. Reviewed METs. Pt progress is slow. Provided encouragment.               Exercise Goals and Review:   Exercise Goals     Row Name 12/13/22 1416             Exercise Goals   Increase Physical Activity Yes       Intervention Provide advice, education, support and counseling about physical activity/exercise needs.;Develop an individualized exercise prescription for aerobic and resistive training based on initial evaluation findings, risk stratification, comorbidities and participant's personal goals.       Expected Outcomes Short Term: Attend rehab on a regular basis to increase amount of physical activity.;Long Term: Exercising  regularly at least 3-5 days a week.;Long Term: Add in home exercise to make exercise part of routine and to increase amount of physical activity.       Increase Strength and Stamina Yes       Intervention Provide advice, education, support and counseling about physical activity/exercise needs.;Develop an individualized exercise prescription for aerobic and resistive training based on initial evaluation findings, risk stratification, comorbidities and participant's personal goals.       Expected Outcomes Short Term: Increase workloads from initial exercise prescription for resistance, speed, and METs.;Short Term: Perform resistance training exercises routinely during rehab and add in resistance training at home;Long Term: Improve cardiorespiratory fitness, muscular endurance and strength as measured by increased METs and functional capacity ( )       Able to understand and use rate of perceived exertion (RPE) scale Yes       Intervention Provide education and explanation on how to use RPE scale       Expected Outcomes Short Term: Able to use RPE daily in rehab to express  subjective intensity level;Long Term:  Able to use RPE to guide intensity level when exercising independently       Knowledge and understanding of Target Heart Rate Range (THRR) Yes       Intervention Provide education and explanation of THRR including how the numbers were predicted and where they are located for reference       Expected Outcomes Short Term: Able to state/look up THRR;Long Term: Able to use THRR to govern intensity when exercising independently;Short Term: Able to use daily as guideline for intensity in rehab       Understanding of Exercise Prescription Yes       Intervention Provide education, explanation, and written materials on patient's individual exercise prescription       Expected Outcomes Short Term: Able to explain program exercise prescription;Long Term: Able to explain home exercise prescription to exercise independently                Exercise Goals Re-Evaluation :  Exercise Goals Re-Evaluation     Row Name 12/21/22 1413             Exercise Goal Re-Evaluation   Exercise Goals Review Increase Physical Activity;Increase Strength and Stamina;Able to understand and use rate of perceived exertion (RPE) scale;Knowledge and understanding of Target Heart Rate Range (THRR);Understanding of Exercise Prescription       Comments Pt's first day in the CRP2 program. Pt understands the exercise Rx, RPE sclae and THRR.       Expected Outcomes Will continue to monitor patient and progress exercise workloads as tolerated.                Discharge Exercise Prescription (Final Exercise Prescription Changes):  Exercise Prescription Changes - 01/06/23 1600       Response to Exercise   Blood Pressure (Admit) 100/58    Blood Pressure (Exercise) 106/62    Blood Pressure (Exit) 94/54    Heart Rate (Admit) 76 bpm    Heart Rate (Exercise) 119 bpm    Heart Rate (Exit) 72 bpm    Rating of Perceived Exertion (Exercise) 11    Symptoms None    Comments Reviewed METs     Duration Progress to 30 minutes of  aerobic without signs/symptoms of physical distress    Intensity THRR unchanged      Progression   Progression Continue to progress workloads to maintain intensity without signs/symptoms of physical distress.  Average METs 1.9      Resistance Training   Training Prescription Yes    Weight 2 lbs    Reps 10-15    Time 10 Minutes      Interval Training   Interval Training No      NuStep   Level 1    SPM 79    Minutes 25    METs 2.1             Nutrition:  Target Goals: Understanding of nutrition guidelines, daily intake of sodium 1500mg , cholesterol 200mg , calories 30% from fat and 7% or less from saturated fats, daily to have 5 or more servings of fruits and vegetables.  Biometrics:  Pre Biometrics - 12/13/22 1115       Pre Biometrics   Waist Circumference 38 inches    Hip Circumference 41 inches    Waist to Hip Ratio 0.93 %    Triceps Skinfold 16 mm    % Body Fat 25.8 %    Grip Strength 30 kg    Flexibility --   Not performed. active low back pain   Single Leg Stand 1.5 seconds              Nutrition Therapy Plan and Nutrition Goals:   Nutrition Assessments:  MEDIFICTS Score Key: >=70 Need to make dietary changes  40-70 Heart Healthy Diet <= 40 Therapeutic Level Cholesterol Diet   Flowsheet Row CARDIAC REHAB PHASE II EXERCISE from 12/26/2022 in [facility]  Picture Your Plate Total Score on Admission 59      Picture Your Plate Scores: <59 Unhealthy dietary pattern with much room for improvement. 41-50 Dietary pattern unlikely to meet recommendations for good health and room for improvement. 51-60 More healthful dietary pattern, with some room for improvement.  >60 Healthy dietary pattern, although there may be some specific behaviors that could be improved.    Nutrition Goals Re-Evaluation:   Nutrition Goals Re-Evaluation:   Nutrition Goals  Discharge (Final Nutrition Goals Re-Evaluation):   Psychosocial: Target Goals: Acknowledge presence or absence of significant depression and/or stress, maximize coping skills, provide positive support system. Participant is able to verbalize types and ability to use techniques and skills needed for reducing stress and depression.  Initial Review & Psychosocial Screening:  Initial Psych Review & Screening - 12/13/22 1422       Initial Review   Current issues with Current Depression;Current Stress Concerns    Source of Stress Concerns Financial;Chronic Illness;Unable to participate in former interests or hobbies    Comments Pt has financial stress due to the war in [place]. Pt has lost his pension as a reult of the war. He has had friends die and the strife in his homeland worries him. Due to his chroinc illness, he does not do things he used to enjoy. Refuses any counseling at his time.      Family Dynamics   Good Support System? Yes   Has his spouse for support.     Barriers   Psychosocial barriers to participate in program The patient should benefit from training in stress management and relaxation.      Screening Interventions   Interventions Encouraged to exercise    Expected Outcomes Long Term Goal: Stressors or current issues are controlled or eliminated.;Short Term goal: Utilizing psychosocial counselor, staff and physician to assist with identification of specific Stressors or current issues interfering with healing process. Setting desired goal for each stressor  or current issue identified.;Short Term goal: Identification and review with participant of any Quality of Life or Depression concerns found by scoring the questionnaire.;Long Term goal: The participant improves quality of Life and PHQ9 Scores as seen by post scores and/or verbalization of changes             Quality of Life Scores:  Quality of Life - 12/13/22 1427       Quality of Life   Select Quality of Life       Quality of Life Scores   Health/Function Pre 16.57 %    Socioeconomic Pre 16.75 %    Psych/Spiritual Pre 18.93 %    Family Pre 25.3 %    GLOBAL Pre 18.38 %            Scores of 19 and below usually indicate a poorer quality of life in these areas.  A difference of  2-3 points is a clinically meaningful difference.  A difference of 2-3 points in the total score of the Quality of Life Index has been associated with significant improvement in overall quality of life, self-image, physical symptoms, and general health in studies assessing change in quality of life.  PHQ-9: Review Flowsheet  More data exists      12/13/2022 08/05/2015 06/25/2015 04/24/2015 12/24/2014  Depression screen PHQ 2/9  Decreased Interest 1 0 0 0 0  Down, Depressed, Hopeless 1 0 0 0 0  PHQ - 2 Score 2 0 0 0 0  Altered sleeping 0 - - - -  Tired, decreased energy 2 - - - -  Change in appetite 2 - - - -  Feeling bad or failure about yourself  0 - - - -  Trouble concentrating 3 - - - -  Moving slowly or fidgety/restless 0 - - - -  Suicidal thoughts 0 - - - -  PHQ-9 Score 9 - - - -  Difficult doing work/chores Not difficult at all - - - -   Interpretation of Total Score  Total Score Depression Severity:  1-4 = Minimal depression, 5-9 = Mild depression, 10-14 = Moderate depression, 15-19 = Moderately severe depression, 20-27 = Severe depression   Psychosocial Evaluation and Intervention:   Psychosocial Re-Evaluation:  Psychosocial Re-Evaluation     Row Name 12/21/22 1411 01/10/23 0809           Psychosocial Re-Evaluation   Current issues with Current Depression;Current Stress Concerns Current Depression;Current Stress Concerns      Comments Stephen Hendricks did not voice any increased concerns or stressors on his first day of exercise at cardiac rehab. Reviewed quality of life on 01/06/23. Patient quality of life slightly altered by physical constraints which limits ability to perform as prior to recent  cardiac illness. Stephen Hendricks admits to having some depression as he lost his eldest daughter and is worried about Civil War in his country. Stephen Hendricks says his depression is manageable. Stephen Hendricks says he has a strong faith and belief in God. Stephen Hendricks does not want this review forwarded to his primary care provider at this time      Expected Outcomes Stephen Hendricks will have controlled or decreased depression or stress upon completion of cardiac rehab Stephen Hendricks will have controlled or decreased depression or stress upon completion of cardiac rehab      Interventions Stress management education;Encouraged to attend Cardiac Rehabilitation for the exercise;Relaxation education Stress management education;Encouraged to attend Cardiac Rehabilitation for the exercise;Relaxation education      Continue Psychosocial  Services  Follow up required by staff Follow up required by staff        Initial Review   Source of Stress Concerns Family;Unable to participate in former interests or hobbies;Retirement/disability;Financial;Unable to perform yard/household activities Family;Unable to participate in former interests or hobbies;Retirement/disability;Financial;Unable to perform yard/household activities      Comments Will continue to monitor and offer support as needed. Will continue to monitor and offer support as needed.               Psychosocial Discharge (Final Psychosocial Re-Evaluation):  Psychosocial Re-Evaluation - 01/10/23 0809       Psychosocial Re-Evaluation   Current issues with Current Depression;Current Stress Concerns    Comments Reviewed quality of life on 01/06/23. Patient quality of life slightly altered by physical constraints which limits ability to perform as prior to recent cardiac illness. Stephen Hendricks admits to having some depression as he lost his eldest daughter and is worried about Civil War in his country. Stephen Hendricks says his depression is manageable. Stephen Hendricks says he has a  strong faith and belief in God. Stephen Hendricks does not want this review forwarded to his primary care provider at this time    Expected Outcomes Stephen Hendricks will have controlled or decreased depression or stress upon completion of cardiac rehab    Interventions Stress management education;Encouraged to attend Cardiac Rehabilitation for the exercise;Relaxation education    Continue Psychosocial Services  Follow up required by staff      Initial Review   Source of Stress Concerns Family;Unable to participate in former interests or hobbies;Retirement/disability;Financial;Unable to perform yard/household activities    Comments Will continue to monitor and offer support as needed.             Vocational Rehabilitation: Provide vocational rehab assistance to qualifying candidates.   Vocational Rehab Evaluation & Intervention:  Vocational Rehab - 12/13/22 1428       Initial Vocational Rehab Evaluation & Intervention   Assessment shows need for Vocational Rehabilitation No   Pt is retired            Education: Education Goals: Education classes will be provided on a weekly basis, covering required topics. Participant will state understanding/return demonstration of topics presented.    Education     Row Name 01/06/23 1400     Education   Cardiac Education Topics Pritikin   [employer]ensed [employer] Nutrition   Nutrition Dining Out - Part 1   Instruction Review Code 1- Verbalizes Understanding   Class Start Time 1358   Class Stop Time 1442   Class Time Calculation (min) 44 min            Core Videos: Exercise    Move It!  Clinical staff conducted group or individual video education with verbal and written material and guidebook.  Patient learns the recommended Pritikin exercise program. Exercise with the goal of living a long, healthy life. Some of the health benefits of exercise include controlled diabetes, healthier blood pressure  levels, improved cholesterol levels, improved heart and lung capacity, improved sleep, and better body composition. Everyone should speak with their doctor before starting or changing an exercise routine.  Biomechanical Limitations Clinical staff conducted group or individual video education with verbal and written material and guidebook.  Patient learns how biomechanical limitations can impact exercise and how we can mitigate and possibly overcome limitations to have an impactful and balanced exercise routine.  Body Composition Clinical staff conducted group or individual video education with verbal and written material and guidebook.  Patient learns that body composition (ratio of muscle mass to fat mass) is a key component to assessing overall fitness, rather than body weight alone. Increased fat mass, especially visceral belly fat, can put us  at increased risk for metabolic syndrome, type 2 diabetes, heart disease, and even death. It is recommended to combine diet and exercise (cardiovascular and resistance training) to improve your body composition. Seek guidance from your physician and exercise physiologist before implementing an exercise routine.  Exercise Action Plan Clinical staff conducted group or individual video education with verbal and written material and guidebook.  Patient learns the recommended strategies to achieve and enjoy long-term exercise adherence, including variety, self-motivation, self-efficacy, and positive decision making. Benefits of exercise include fitness, good health, weight management, more energy, better sleep, less stress, and overall well-being.  Medical   Heart Disease Risk Reduction Clinical staff conducted group or individual video education with verbal and written material and guidebook.  Patient learns our heart is our most vital organ as it circulates oxygen, nutrients, white blood cells, and hormones throughout the entire body, and carries waste away.  Data supports a plant-based eating plan like the Pritikin Program for its effectiveness in slowing progression of and reversing heart disease. The video provides a number of recommendations to address heart disease.   Metabolic Syndrome and Belly Fat  Clinical staff conducted group or individual video education with verbal and written material and guidebook.  Patient learns what metabolic syndrome is, how it leads to heart disease, and how one can reverse it and keep it from coming back. You have metabolic syndrome if you have 3 of the following 5 criteria: abdominal obesity, high blood pressure, high triglycerides, low HDL cholesterol, and high blood sugar.  Hypertension and Heart Disease Clinical staff conducted group or individual video education with verbal and written material and guidebook.  Patient learns that high blood pressure, or hypertension, is very common in the [place] . Hypertension is largely due to excessive salt intake, but other important risk factors include being overweight, physical inactivity, drinking too much alcohol, smoking, and not eating enough potassium from fruits and vegetables. High blood pressure is a leading risk factor for heart attack, stroke, congestive heart failure, dementia, kidney failure, and premature death. Long-term effects of excessive salt intake include stiffening of the arteries and thickening of heart muscle and organ damage. Recommendations include ways to reduce hypertension and the risk of heart disease.  Diseases of Our Time - Focusing on Diabetes Clinical staff conducted group or individual video education with verbal and written material and guidebook.  Patient learns why the best way to stop diseases of our time is prevention, through food and other lifestyle changes. Medicine (such as prescription pills and surgeries) is often only a Band-Aid on the problem, not a long-term solution. Most common diseases of our time include obesity, type 2  diabetes, hypertension, heart disease, and cancer. The Pritikin Program is recommended and has been proven to help reduce, reverse, and/or prevent the damaging effects of metabolic syndrome.  Nutrition   Overview of the Pritikin Eating Plan  Clinical staff conducted group or individual video education with verbal and written material and guidebook.  Patient learns about the Pritikin Eating Plan for disease risk reduction. The Pritikin Eating Plan emphasizes a wide variety of unrefined, minimally-processed carbohydrates, like fruits, vegetables, whole grains, and legumes. Go, Caution, and Stop food choices are  explained. Plant-based and lean animal proteins are emphasized. Rationale provided for low sodium intake for blood pressure control, low added sugars for blood sugar stabilization, and low added fats and oils for coronary artery disease risk reduction and weight management.  Calorie Density  Clinical staff conducted group or individual video education with verbal and written material and guidebook.  Patient learns about calorie density and how it impacts the Pritikin Eating Plan. Knowing the characteristics of the food you choose will help you decide whether those foods will lead to weight gain or weight loss, and whether you want to consume more or less of them. Weight loss is usually a side effect of the Pritikin Eating Plan because of its focus on low calorie-dense foods.  Label Reading  Clinical staff conducted group or individual video education with verbal and written material and guidebook.  Patient learns about the Pritikin recommended label reading guidelines and corresponding recommendations regarding calorie density, added sugars, sodium content, and whole grains.  Dining Out - Part 1  Clinical staff conducted group or individual video education with verbal and written material and guidebook.  Patient learns that restaurant meals can be sabotaging because they can be so high in  calories, fat, sodium, and/or sugar. Patient learns recommended strategies on how to positively address this and avoid unhealthy pitfalls.  Facts on Fats  Clinical staff conducted group or individual video education with verbal and written material and guidebook.  Patient learns that lifestyle modifications can be just as effective, if not more so, as many medications for lowering your risk of heart disease. A Pritikin lifestyle can help to reduce your risk of inflammation and atherosclerosis (cholesterol build-up, or plaque, in the artery walls). Lifestyle interventions such as dietary choices and physical activity address the cause of atherosclerosis. A review of the types of fats and their impact on blood cholesterol levels, along with dietary recommendations to reduce fat intake is also included.  Nutrition Action Plan  Clinical staff conducted group or individual video education with verbal and written material and guidebook.  Patient learns how to incorporate Pritikin recommendations into their lifestyle. Recommendations include planning and keeping personal health goals in mind as an important part of their success.  Healthy Mind-Set    Healthy Minds, Bodies, Hearts  Clinical staff conducted group or individual video education with verbal and written material and guidebook.  Patient learns how to identify when they are stressed. Video will discuss the impact of that stress, as well as the many benefits of stress management. Patient will also be introduced to stress management techniques. The way we think, act, and feel has an impact on our hearts.  How Our Thoughts Can Heal Our Hearts  Clinical staff conducted group or individual video education with verbal and written material and guidebook.  Patient learns that negative thoughts can cause depression and anxiety. This can result in negative lifestyle behavior and serious health problems. Cognitive behavioral therapy is an effective method to  help control our thoughts in order to change and improve our emotional outlook.  Additional Videos:  Exercise    Improving Performance  Clinical staff conducted group or individual video education with verbal and written material and guidebook.  Patient learns to use a non-linear approach by alternating intensity levels and lengths of time spent exercising to help burn more calories and lose more body fat. Cardiovascular exercise helps improve heart health, metabolism, hormonal balance, blood sugar control, and recovery from fatigue. Resistance training improves strength, endurance, balance, coordination,  reaction time, metabolism, and muscle mass. Flexibility exercise improves circulation, posture, and balance. Seek guidance from your physician and exercise physiologist before implementing an exercise routine and learn your capabilities and proper form for all exercise.  Introduction to Yoga  Clinical staff conducted group or individual video education with verbal and written material and guidebook.  Patient learns about yoga, a discipline of the coming together of mind, breath, and body. The benefits of yoga include improved flexibility, improved range of motion, better posture and core strength, increased lung function, weight loss, and positive self-image. Yoga's heart health benefits include lowered blood pressure, healthier heart rate, decreased cholesterol and triglyceride levels, improved immune function, and reduced stress. Seek guidance from your physician and exercise physiologist before implementing an exercise routine and learn your capabilities and proper form for all exercise.  Medical   Aging: Enhancing Your Quality of Life  Clinical staff conducted group or individual video education with verbal and written material and guidebook.  Patient learns key strategies and recommendations to stay in good physical health and enhance quality of life, such as prevention strategies, having an  advocate, securing a Health Care Proxy and Power of Attorney, and keeping a list of medications and system for tracking them. It also discusses how to avoid risk for bone loss.  Biology of Weight Control  Clinical staff conducted group or individual video education with verbal and written material and guidebook.  Patient learns that weight gain occurs because we consume more calories than we burn (eating more, moving less). Even if your body weight is normal, you may have higher ratios of fat compared to muscle mass. Too much body fat puts you at increased risk for cardiovascular disease, heart attack, stroke, type 2 diabetes, and obesity-related cancers. In addition to exercise, following the Pritikin Eating Plan can help reduce your risk.  Decoding Lab Results  Clinical staff conducted group or individual video education with verbal and written material and guidebook.  Patient learns that lab test reflects one measurement whose values change over time and are influenced by many factors, including medication, stress, sleep, exercise, food, hydration, pre-existing medical conditions, and more. It is recommended to use the knowledge from this video to become more involved with your lab results and evaluate your numbers to speak with your doctor.   Diseases of Our Time - Overview  Clinical staff conducted group or individual video education with verbal and written material and guidebook.  Patient learns that according to the CDC, 50% to 70% of chronic diseases (such as obesity, type 2 diabetes, elevated lipids, hypertension, and heart disease) are avoidable through lifestyle improvements including healthier food choices, listening to satiety cues, and increased physical activity.  Sleep Disorders Clinical staff conducted group or individual video education with verbal and written material and guidebook.  Patient learns how good quality and duration of sleep are important to overall health and  well-being. Patient also learns about sleep disorders and how they impact health along with recommendations to address them, including discussing with a physician.  Nutrition  Dining Out - Part 2 Clinical staff conducted group or individual video education with verbal and written material and guidebook.  Patient learns how to plan ahead and communicate in order to maximize their dining experience in a healthy and nutritious manner. Included are recommended food choices based on the type of restaurant the patient is visiting.   Fueling a Banker conducted group or individual video education with verbal and written material  and guidebook.  There is a strong connection between our food choices and our health. Diseases like obesity and type 2 diabetes are very prevalent and are in large-part due to lifestyle choices. The Pritikin Eating Plan provides plenty of food and hunger-curbing satisfaction. It is easy to follow, affordable, and helps reduce health risks.  Menu Workshop  Clinical staff conducted group or individual video education with verbal and written material and guidebook.  Patient learns that restaurant meals can sabotage health goals because they are often packed with calories, fat, sodium, and sugar. Recommendations include strategies to plan ahead and to communicate with the manager, chef, or server to help order a healthier meal.  Planning Your Eating Strategy  Clinical staff conducted group or individual video education with verbal and written material and guidebook.  Patient learns about the Pritikin Eating Plan and its benefit of reducing the risk of disease. The Pritikin Eating Plan does not focus on calories. Instead, it emphasizes high-quality, nutrient-rich foods. By knowing the characteristics of the foods, we choose, we can determine their calorie density and make informed decisions.  Targeting Your Nutrition Priorities  Clinical staff conducted group or  individual video education with verbal and written material and guidebook.  Patient learns that lifestyle habits have a tremendous impact on disease risk and progression. This video provides eating and physical activity recommendations based on your personal health goals, such as reducing LDL cholesterol, losing weight, preventing or controlling type 2 diabetes, and reducing high blood pressure.  Vitamins and Minerals  Clinical staff conducted group or individual video education with verbal and written material and guidebook.  Patient learns different ways to obtain key vitamins and minerals, including through a recommended healthy diet. It is important to discuss all supplements you take with your doctor.   Healthy Mind-Set    Smoking Cessation  Clinical staff conducted group or individual video education with verbal and written material and guidebook.  Patient learns that cigarette smoking and tobacco addiction pose a serious health risk which affects millions of people. Stopping smoking will significantly reduce the risk of heart disease, lung disease, and many forms of cancer. Recommended strategies for quitting are covered, including working with your doctor to develop a successful plan.  Culinary   Becoming a Set Designer conducted group or individual video education with verbal and written material and guidebook.  Patient learns that cooking at home can be healthy, cost-effective, quick, and puts them in control. Keys to cooking healthy recipes will include looking at your recipe, assessing your equipment needs, planning ahead, making it simple, choosing cost-effective seasonal ingredients, and limiting the use of added fats, salts, and sugars.  Cooking - Breakfast and Snacks  Clinical staff conducted group or individual video education with verbal and written material and guidebook.  Patient learns how important breakfast is to satiety and nutrition through the entire  day. Recommendations include key foods to eat during breakfast to help stabilize blood sugar levels and to prevent overeating at meals later in the day. Planning ahead is also a key component.  Cooking - Educational Psychologist conducted group or individual video education with verbal and written material and guidebook.  Patient learns eating strategies to improve overall health, including an approach to cook more at home. Recommendations include thinking of animal protein as a side on your plate rather than center stage and focusing instead on lower calorie dense options like vegetables, fruits, whole grains, and plant-based proteins, such as  beans. Making sauces in large quantities to freeze for later and leaving the skin on your vegetables are also recommended to maximize your experience.  Cooking - Healthy Salads and Dressing Clinical staff conducted group or individual video education with verbal and written material and guidebook.  Patient learns that vegetables, fruits, whole grains, and legumes are the foundations of the Pritikin Eating Plan. Recommendations include how to incorporate each of these in flavorful and healthy salads, and how to create homemade salad dressings. Proper handling of ingredients is also covered. Cooking - Soups and [employer] - Soups and Desserts Clinical staff conducted group or individual video education with verbal and written material and guidebook.  Patient learns that Pritikin soups and desserts make for easy, nutritious, and delicious snacks and meal components that are low in sodium, fat, sugar, and calorie density, while high in vitamins, minerals, and filling fiber. Recommendations include simple and healthy ideas for soups and desserts.   Overview     The Pritikin Solution Program Overview Clinical staff conducted group or individual video education with verbal and written material and guidebook.  Patient learns that the results of the  Pritikin Program have been documented in more than 100 articles published in peer-reviewed journals, and the benefits include reducing risk factors for (and, in some cases, even reversing) high cholesterol, high blood pressure, type 2 diabetes, obesity, and more! An overview of the three key pillars of the Pritikin Program will be covered: eating well, doing regular exercise, and having a healthy mind-set.  WORKSHOPS  Exercise: Exercise Basics: Building Your Action Plan Clinical staff led group instruction and group discussion with PowerPoint presentation and patient guidebook. To enhance the learning environment the use of posters, models and videos may be added. At the conclusion of this workshop, patients will comprehend the difference between physical activity and exercise, as well as the benefits of incorporating both, into their routine. Patients will understand the FITT (Frequency, Intensity, Time, and Type) principle and how to use it to build an exercise action plan. In addition, safety concerns and other considerations for exercise and cardiac rehab will be addressed by the presenter. The purpose of this lesson is to promote a comprehensive and effective weekly exercise routine in order to improve patients' overall level of fitness.   Managing Heart Disease: Your Path to a Healthier Heart Clinical staff led group instruction and group discussion with PowerPoint presentation and patient guidebook. To enhance the learning environment the use of posters, models and videos may be added.At the conclusion of this workshop, patients will understand the anatomy and physiology of the heart. Additionally, they will understand how Pritikin's three pillars impact the risk factors, the progression, and the management of heart disease.  The purpose of this lesson is to provide a high-level overview of the heart, heart disease, and how the Pritikin lifestyle positively impacts risk factors.  Exercise  Biomechanics Clinical staff led group instruction and group discussion with PowerPoint presentation and patient guidebook. To enhance the learning environment the use of posters, models and videos may be added. Patients will learn how the structural parts of their bodies function and how these functions impact their daily activities, movement, and exercise. Patients will learn how to promote a neutral spine, learn how to manage pain, and identify ways to improve their physical movement in order to promote healthy living. The purpose of this lesson is to expose patients to common physical limitations that impact physical activity. Participants will learn practical ways to adapt  and manage aches and pains, and to minimize their effect on regular exercise. Patients will learn how to maintain good posture while sitting, walking, and lifting.  Balance Training and Fall Prevention  Clinical staff led group instruction and group discussion with PowerPoint presentation and patient guidebook. To enhance the learning environment the use of posters, models and videos may be added. At the conclusion of this workshop, patients will understand the importance of their sensorimotor skills (vision, proprioception, and the vestibular system) in maintaining their ability to balance as they age. Patients will apply a variety of balancing exercises that are appropriate for their current level of function. Patients will understand the common causes for poor balance, possible solutions to these problems, and ways to modify their physical environment in order to minimize their fall risk. The purpose of this lesson is to teach patients about the importance of maintaining balance as they age and ways to minimize their risk of falling.  WORKSHOPS   Nutrition:  Fueling a Ship Broker led group instruction and group discussion with PowerPoint presentation and patient guidebook. To enhance the learning  environment the use of posters, models and videos may be added. Patients will review the foundational principles of the Pritikin Eating Plan and understand what constitutes a serving size in each of the food groups. Patients will also learn Pritikin-friendly foods that are better choices when away from home and review make-ahead meal and snack options. Calorie density will be reviewed and applied to three nutrition priorities: weight maintenance, weight loss, and weight gain. The purpose of this lesson is to reinforce (in a group setting) the key concepts around what patients are recommended to eat and how to apply these guidelines when away from home by planning and selecting Pritikin-friendly options. Patients will understand how calorie density may be adjusted for different weight management goals.  Mindful Eating  Clinical staff led group instruction and group discussion with PowerPoint presentation and patient guidebook. To enhance the learning environment the use of posters, models and videos may be added. Patients will briefly review the concepts of the Pritikin Eating Plan and the importance of low-calorie dense foods. The concept of mindful eating will be introduced as well as the importance of paying attention to internal hunger signals. Triggers for non-hunger eating and techniques for dealing with triggers will be explored. The purpose of this lesson is to provide patients with the opportunity to review the basic principles of the Pritikin Eating Plan, discuss the value of eating mindfully and how to measure internal cues of hunger and fullness using the Hunger Scale. Patients will also discuss reasons for non-hunger eating and learn strategies to use for controlling emotional eating.  Targeting Your Nutrition Priorities Clinical staff led group instruction and group discussion with PowerPoint presentation and patient guidebook. To enhance the learning environment the use of posters, models and  videos may be added. Patients will learn how to determine their genetic susceptibility to disease by reviewing their family history. Patients will gain insight into the importance of diet as part of an overall healthy lifestyle in mitigating the impact of genetics and other environmental insults. The purpose of this lesson is to provide patients with the opportunity to assess their personal nutrition priorities by looking at their family history, their own health history and current risk factors. Patients will also be able to discuss ways of prioritizing and modifying the Pritikin Eating Plan for their highest risk areas  Menu  Clinical staff led group instruction and  group discussion with PowerPoint presentation and patient guidebook. To enhance the learning environment the use of posters, models and videos may be added. Using menus brought in from [employer], or printed from [employer], patients will apply the Pritikin dining out guidelines that were presented in the [employer] video. Patients will also be able to practice these guidelines in a variety of provided scenarios. The purpose of this lesson is to provide patients with the opportunity to practice hands-on learning of the Pritikin Dining Out guidelines with actual menus and practice scenarios.  Label Reading Clinical staff led group instruction and group discussion with PowerPoint presentation and patient guidebook. To enhance the learning environment the use of posters, models and videos may be added. Patients will review and discuss the Pritikin label reading guidelines presented in Pritikin's Label Reading Educational series video. Using fool labels brought in from local grocery stores and markets, patients will apply the label reading guidelines and determine if the packaged food meet the Pritikin guidelines. The purpose of this lesson is to provide patients with the opportunity to review, discuss, and practice  hands-on learning of the Pritikin Label Reading guidelines with actual packaged food labels. Cooking School  Pritikin's [employer] are designed to teach patients ways to prepare quick, simple, and affordable recipes at home. The importance of nutrition's role in chronic disease risk reduction is reflected in its emphasis in the overall Pritikin program. By learning how to prepare essential core Pritikin Eating Plan recipes, patients will increase control over what they eat; be able to customize the flavor of foods without the use of added salt, sugar, or fat; and improve the quality of the food they consume. By learning a set of core recipes which are easily assembled, quickly prepared, and affordable, patients are more likely to prepare more healthy foods at home. These workshops focus on convenient breakfasts, simple entres, side dishes, and desserts which can be prepared with minimal effort and are consistent with nutrition recommendations for cardiovascular risk reduction. Cooking [employer] are taught by a armed forces logistics/support/administrative officer (RD) who has been trained by the [employer]. The chef or RD has a clear understanding of the importance of minimizing - if not completely eliminating - added fat, sugar, and sodium in recipes. Throughout the series of Cooking School Workshop sessions, patients will learn about healthy ingredients and efficient methods of cooking to build confidence in their capability to prepare    Cooking School weekly topics:  Adding Flavor- Sodium-Free  Fast and Healthy Breakfasts  Powerhouse Plant-Based Proteins  Satisfying Salads and Dressings  Simple Sides and Sauces  International Cuisine-Spotlight on the [employer] Zones  Delicious Desserts  Savory Soups  [employer] - Meals in a Astronomer Appetizers and Snacks  Comforting Weekend Breakfasts  One-Pot Wonders   Fast Evening Meals  Landscape Architect Your Pritikin  Plate  WORKSHOPS   Healthy Mindset (Psychosocial):  Focused Goals, Sustainable Changes Clinical staff led group instruction and group discussion with PowerPoint presentation and patient guidebook. To enhance the learning environment the use of posters, models and videos may be added. Patients will be able to apply effective goal setting strategies to establish at least one personal goal, and then take consistent, meaningful action toward that goal. They will learn to identify common barriers to achieving personal goals and develop strategies to overcome them. Patients will also gain an understanding of how our mind-set can impact our ability to achieve goals  and the importance of cultivating a positive and growth-oriented mind-set. The purpose of this lesson is to provide patients with a deeper understanding of how to set and achieve personal goals, as well as the tools and strategies needed to overcome common obstacles which may arise along the way.  From Head to Heart: The Power of a Healthy Outlook  Clinical staff led group instruction and group discussion with PowerPoint presentation and patient guidebook. To enhance the learning environment the use of posters, models and videos may be added. Patients will be able to recognize and describe the impact of emotions and mood on physical health. They will discover the importance of self-care and explore self-care practices which may work for them. Patients will also learn how to utilize the 4 C's to cultivate a healthier outlook and better manage stress and challenges. The purpose of this lesson is to demonstrate to patients how a healthy outlook is an essential part of maintaining good health, especially as they continue their cardiac rehab journey.  Healthy Sleep for a Healthy Heart Clinical staff led group instruction and group discussion with PowerPoint presentation and patient guidebook. To enhance the learning environment the use of posters,  models and videos may be added. At the conclusion of this workshop, patients will be able to demonstrate knowledge of the importance of sleep to overall health, well-being, and quality of life. They will understand the symptoms of, and treatments for, common sleep disorders. Patients will also be able to identify daytime and nighttime behaviors which impact sleep, and they will be able to apply these tools to help manage sleep-related challenges. The purpose of this lesson is to provide patients with a general overview of sleep and outline the importance of quality sleep. Patients will learn about a few of the most common sleep disorders. Patients will also be introduced to the concept of "sleep hygiene," and discover ways to self-manage certain sleeping problems through simple daily behavior changes. Finally, the workshop will motivate patients by clarifying the links between quality sleep and their goals of heart-healthy living.   Recognizing and Reducing Stress Clinical staff led group instruction and group discussion with PowerPoint presentation and patient guidebook. To enhance the learning environment the use of posters, models and videos may be added. At the conclusion of this workshop, patients will be able to understand the types of stress reactions, differentiate between acute and chronic stress, and recognize the impact that chronic stress has on their health. They will also be able to apply different coping mechanisms, such as reframing negative self-talk. Patients will have the opportunity to practice a variety of stress management techniques, such as deep abdominal breathing, progressive muscle relaxation, and/or guided imagery.  The purpose of this lesson is to educate patients on the role of stress in their lives and to provide healthy techniques for coping with it.  Learning Barriers/Preferences:  Learning Barriers/Preferences - 12/13/22 1427       Learning Barriers/Preferences   Learning  Barriers Sight    Learning Preferences Audio;Computer/Internet;Group Instruction;Individual Instruction;Pictoral;Skilled Demonstration;Verbal Instruction;Video;Written Material             Education Topics:  Knowledge Questionnaire Score:  Knowledge Questionnaire Score - 12/13/22 1428       Knowledge Questionnaire Score   Pre Score 21/24             Core Components/Risk Factors/Patient Goals at Admission:  Personal Goals and Risk Factors at Admission - 12/13/22 1428       Core Components/Risk Factors/Patient  Goals on Admission   Heart Failure Yes    Intervention Provide a combined exercise and nutrition program that is supplemented with education, support and counseling about heart failure. Directed toward relieving symptoms such as shortness of breath, decreased exercise tolerance, and extremity edema.    Expected Outcomes Improve functional capacity of life;Short term: Attendance in program 2-3 days a week with increased exercise capacity. Reported lower sodium intake. Reported increased fruit and vegetable intake. Reports medication compliance.;Short term: Daily weights obtained and reported for increase. Utilizing diuretic protocols set by physician.;Long term: Adoption of self-care skills and reduction of barriers for early signs and symptoms recognition and intervention leading to self-care maintenance.    Hypertension Yes    Intervention Provide education on lifestyle modifcations including regular physical activity/exercise, weight management, moderate sodium restriction and increased consumption of fresh fruit, vegetables, and low fat dairy, alcohol moderation, and smoking cessation.;Monitor prescription use compliance.    Expected Outcomes Short Term: Continued assessment and intervention until BP is < 140/73mm HG in hypertensive participants. < 130/22mm HG in hypertensive participants with diabetes, heart failure or chronic kidney disease.;Long Term: Maintenance of blood  pressure at goal levels.    Lipids Yes    Intervention Provide education and support for participant on nutrition & aerobic/resistive exercise along with prescribed medications to achieve LDL 70mg , HDL >40mg .    Expected Outcomes Short Term: Participant states understanding of desired cholesterol values and is compliant with medications prescribed. Participant is following exercise prescription and nutrition guidelines.;Long Term: Cholesterol controlled with medications as prescribed, with individualized exercise RX and with personalized nutrition plan. Value goals: LDL < 70mg , HDL > 40 mg.    Stress Yes    Intervention Offer individual and/or small group education and counseling on adjustment to heart disease, stress management and health-related lifestyle change. Teach and support self-help strategies.;Refer participants experiencing significant psychosocial distress to appropriate mental health specialists for further evaluation and treatment. When possible, include family members and significant others in education/counseling sessions.    Expected Outcomes Short Term: Participant demonstrates changes in health-related behavior, relaxation and other stress management skills, ability to obtain effective social support, and compliance with psychotropic medications if prescribed.;Long Term: Emotional wellbeing is indicated by absence of clinically significant psychosocial distress or social isolation.             Core Components/Risk Factors/Patient Goals Review:   Goals and Risk Factor Review     Row Name 12/21/22 1416 01/10/23 0817           Core Components/Risk Factors/Patient Goals Review   Personal Goals Review Weight Management/Obesity;Hypertension;Lipids;Stress Weight Management/Obesity;Hypertension;Lipids;Stress      Review Stephen Hendricks started cardiac rehab on 12/21/22. Stephen Hendricks did well with exercise for his fitness level. Vital signs were stable. Stephen Hendricks did well with exercise for his fitness  level. Vital signs and weight has been stable. Stephen Hendricks says participating in cardiac rehab has been helpful so far.      Expected Outcomes Stephen Hendricks will continue to particpate in cardiac rehab for exercise, nutrtiion and lifestyle modifications Stephen Hendricks will continue to particpate in cardiac rehab for exercise, nutrtiion and lifestyle modifications               Core Components/Risk Factors/Patient Goals at Discharge (Final Review):   Goals and Risk Factor Review - 01/10/23 0817       Core Components/Risk Factors/Patient Goals Review   Personal Goals Review Weight Management/Obesity;Hypertension;Lipids;Stress    Review Stephen Hendricks did well with exercise for his fitness level. Vital signs  and weight has been stable. Stephen Hendricks says participating in cardiac rehab has been helpful so far.    Expected Outcomes Stephen Hendricks will continue to particpate in cardiac rehab for exercise, nutrtiion and lifestyle modifications             ITP Comments:  ITP Comments     Row Name 12/13/22 1411 12/21/22 1409 01/10/23 0807       ITP Comments Wilbert Bihari, MD:  Medical Director. Intoduction to the [facility]/Inensive Cardiac Rehab.  Initial orientation packet reviewed with the patient. 30 Day Review. Stephen Hendricks started cardiac rehab on 12/21/22/ Stephen Hendricks did well with exercise for his fitness level. 30 Day Review. Stephen Hendricks has good attendance and participation in cardiac rehab              Comments: See ITP comments.Hadassah Elpidio Quan RN BSN

## 2023-01-11 ENCOUNTER — Encounter (HOSPITAL_COMMUNITY): Payer: Medicare Other

## 2023-01-13 ENCOUNTER — Encounter (HOSPITAL_COMMUNITY): Payer: Medicare Other

## 2023-01-13 ENCOUNTER — Telehealth (HOSPITAL_COMMUNITY): Payer: Self-pay

## 2023-01-13 NOTE — Telephone Encounter (Signed)
 Left VM for patient, no classes offered after 12:30 due to impending weather

## 2023-01-16 ENCOUNTER — Telehealth (HOSPITAL_COMMUNITY): Payer: Self-pay | Admitting: *Deleted

## 2023-01-16 ENCOUNTER — Encounter (HOSPITAL_COMMUNITY): Payer: Medicare Other

## 2023-01-16 NOTE — Telephone Encounter (Signed)
 Received voice mail message that Stephen Hendricks will not attend cardiac rehab today due to feeling tired with a low BP.

## 2023-01-18 ENCOUNTER — Encounter (HOSPITAL_COMMUNITY): Payer: Medicare Other

## 2023-01-20 ENCOUNTER — Encounter (HOSPITAL_COMMUNITY): Payer: Medicare Other

## 2023-01-20 ENCOUNTER — Encounter (HOSPITAL_COMMUNITY)
Admission: RE | Admit: 2023-01-20 | Discharge: 2023-01-20 | Disposition: A | Discharge status: Home / Self Care | Payer: Medicare Other | Source: Ambulatory Visit | Attending: Cardiology | Admitting: Cardiology

## 2023-01-20 DIAGNOSIS — I5022 Chronic systolic (congestive) heart failure: Secondary | ICD-10-CM

## 2023-01-20 NOTE — Progress Notes (Signed)
Incomplete Session Note  Patient Details  Name: Stephen Hendricks MRN: [id number] Date of Birth: [birth year] Referring Provider:   Flowsheet Row CARDIAC REHAB PHASE II ORIENTATION from 12/13/2022 in [facility]  Referring Provider Dr. Sullivan Lone ([name], MD covering)       Stephen Hendricks did not complete his rehab session.  Patient reports feeling lightheaded drove to cardiac rehab today with his wife. Sitting BP 92/60 heart rate 72. Standing BP 94/68. Oxygen saturation 97% on room air.  Patient was given water. Repeat blood pressure 98/70. [facility] and notified about today's symptoms.Patient spoke with heart failure nurse navigator over the phone. Appointment obtained for Stephen Hendricks to see Alice Rieger [facility] on 01/129/25 at 3 pm.  The nurse navigator told [name] that she would let either [facility] or Dr  Sullivan Lone about the patient's symptoms and call the patient with further instructions.  Medications reviewed. [name] has not been taking his metoprolol due to his continued symptoms per the patient's wife in 5 days. I advised [name] not to exercise today due to his symptoms. [name] will also hold exercise on Monday. [name] will return to cardiac rehab when his symptoms are resolved. Onsite provider Bernadene Person NP  notified about the patient's symptoms. [name] left cardiac rehab without further complaints.Stephen Headings RN BSN

## 2023-01-23 ENCOUNTER — Encounter (HOSPITAL_COMMUNITY): Payer: Medicare Other

## 2023-01-25 ENCOUNTER — Encounter (HOSPITAL_COMMUNITY): Payer: Medicare Other

## 2023-01-27 ENCOUNTER — Encounter (HOSPITAL_COMMUNITY): Payer: Medicare Other

## 2023-01-27 ENCOUNTER — Encounter (HOSPITAL_COMMUNITY): Payer: Medicaid Other

## 2023-01-30 ENCOUNTER — Encounter (HOSPITAL_COMMUNITY): Payer: Medicare Other

## 2023-01-30 ENCOUNTER — Encounter (HOSPITAL_COMMUNITY): Admission: RE | Admit: 2023-01-30 | Payer: Medicare Other | Source: Ambulatory Visit | Attending: Cardiology

## 2023-01-31 ENCOUNTER — Telehealth (HOSPITAL_COMMUNITY): Payer: Self-pay | Admitting: *Deleted

## 2023-01-31 NOTE — Telephone Encounter (Signed)
Attempted to call in regard to cardiac rehab appointments. Unable to leave message.Thayer Headings RN BSN

## 2023-02-01 ENCOUNTER — Encounter (HOSPITAL_COMMUNITY): Payer: Medicare Other

## 2023-02-03 ENCOUNTER — Encounter (HOSPITAL_COMMUNITY): Payer: Self-pay | Admitting: *Deleted

## 2023-02-03 ENCOUNTER — Encounter (HOSPITAL_COMMUNITY): Payer: Medicare Other

## 2023-02-03 DIAGNOSIS — I5022 Chronic systolic (congestive) heart failure: Secondary | ICD-10-CM

## 2023-02-03 NOTE — Progress Notes (Signed)
Cardiac Individual Treatment Plan  Patient Details  Name: Stephen Hendricks MRN: [id number] Date of Birth: 09/07/1940 Referring Provider:   Flowsheet Row CARDIAC REHAB PHASE II ORIENTATION from 12/13/2022 in [facility]  Referring Provider Dr. Sullivan Lone ([name], MD covering)       Initial Encounter Date:  Flowsheet Row CARDIAC REHAB PHASE II ORIENTATION from 12/13/2022 in [facility]  Date 12/13/22       Visit Diagnosis: Heart failure, chronic systolic (HCC)  Patient's Home Medications on Admission:  Current Outpatient Medications:    apixaban (ELIQUIS) 5 MG TABS tablet, Take 1 tablet (5 mg total) by mouth 2 (two) times daily., Disp: 60 tablet, Rfl: 5   benzonatate (TESSALON) 100 MG capsule, Take 100 mg by mouth 3 (three) times daily. Take 1 to 2 capsules by mouth up to 3 times daily as needed for cough. (Patient not taking: Reported on 01/20/2023), Disp: , Rfl:    Cholecalciferol 25 MCG (1000 UT) TBDP, Take 25 mcg by mouth daily. 1,000 units, Disp: , Rfl:    eplerenone (INSPRA) 25 MG tablet, Take 25 mg by mouth daily., Disp: , Rfl:    famotidine (PEPCID) 20 MG tablet, TAKE 1 TABLET (20 MG TOTAL) BY MOUTH 2 (TWO) TIMES DAILY. (Patient not taking: Reported on 01/20/2023), Disp: 300 tablet, Rfl: 0   FARXIGA 10 MG TABS tablet, Take 10 mg by mouth daily., Disp: , Rfl:    finasteride (PROSCAR) 5 MG tablet, Take 1 tablet (5 mg total) by mouth daily., Disp: 150 tablet, Rfl: 0   Flaxseed, Linseed, (FLAXSEED OIL PO), Take 1 capsule by mouth daily., Disp: , Rfl:    GARLIC PO, Take 1,000 mg by mouth once as needed. Pt takes a 5,000 mg tablet - 1 tablet every day., Disp: , Rfl:    losartan (COZAAR) 25 MG tablet, Take 1 tablet (25 mg total) by mouth daily., Disp: 90 tablet, Rfl: 3   magnesium oxide (MAG-OX) 400 MG tablet, Take 1 tablet by mouth 2 (two) times daily., Disp: , Rfl:     metoprolol succinate (TOPROL-XL) 25 MG 24 hr tablet, Take 12.5 mg by mouth daily. (Patient not taking: Reported on 01/20/2023), Disp: , Rfl:    nitroGLYCERIN (NITROSTAT) 0.4 MG SL tablet, Place 0.4 mg under the tongue every 5 (five) minutes as needed., Disp: , Rfl:    omeprazole (PRILOSEC) 20 MG capsule, Take 20 mg by mouth 2 (two) times daily before a meal. (Patient not taking: Reported on 01/20/2023), Disp: , Rfl:    rosuvastatin (CRESTOR) 10 MG tablet, Take 1 tablet by mouth daily., Disp: , Rfl:    tamsulosin (FLOMAX) 0.4 MG CAPS capsule, Take 2 capsules (0.8 mg total) by mouth daily after supper. (Patient taking differently: Take 0.4 mg by mouth daily after supper. Takes one capsule daily), Disp: 300 capsule, Rfl: 0   torsemide (DEMADEX) 10 MG tablet, Take 10 mg by mouth daily as needed., Disp: , Rfl:    Travoprost, BAK Free, (TRAVATAN) 0.004 % SOLN ophthalmic solution, Place 1 drop into the right eye at bedtime., Disp: , Rfl:   Past Medical History: Past Medical History:  Diagnosis Date   Acute pulmonary embolism (HCC) 12/03/2015   Bilateral lower lobe pulmonary emboli 11/2015.   BPH (benign prostatic hyperplasia)    Cataract of right eye    CHF (congestive heart failure) (HCC)    Frozen shoulder  HTN (hypertension)    Hx of prostatitis 3.20.16   WITH E.COLI   LBBB (left bundle branch block)    Leishmaniasis    [place]   [place]   Oral candidiasis    [place]   Right tennis elbow    SOB (shortness of breath)     Tobacco Use: Social History   Tobacco Use  Smoking Status Former   Current packs/day: 0.00   Types: Cigarettes   Start date: 01/04/1956   Quit date: 01/04/1991   Years since quitting: 32.1  Smokeless Tobacco Never    Labs: Review Flowsheet       Latest Ref Rng & Units 09/11/2014 09/29/2014 11/14/2014  Labs for ITP Cardiac and Pulmonary Rehab  Cholestrol 125 - 200 mg/dL - 161  096   LDL (calc) <130 mg/dL - 045  49   HDL-C  >=40 mg/dL - 67  70   Trlycerides <150 mg/dL - 981  66   Hemoglobin A1c - 5.1  - -    Capillary Blood Glucose: Lab Results  Component Value Date   GLUCAP 91 09/11/2014     Exercise Target Goals: Exercise Program Goal: Individual exercise prescription set using results from initial 6 min walk test and THRR while considering  patient's activity barriers and safety.   Exercise Prescription Goal: Initial exercise prescription builds to 30-45 minutes a day of aerobic activity, 2-3 days per week.  Home exercise guidelines will be given to patient during program as part of exercise prescription that the participant will acknowledge.  Activity Barriers & Risk Stratification:  Activity Barriers & Cardiac Risk Stratification - 12/13/22 1414       Activity Barriers & Cardiac Risk Stratification   Activity Barriers Balance Concerns;Arthritis;Joint Problems;History of Falls;Back Problems;Deconditioning;Assistive Device;Muscular Weakness;Left Knee Replacement;Decreased Ventricular Function    Cardiac Risk Stratification High             6 Minute Walk:  6 Minute Walk     Row Name 12/13/22 1220         6 Minute Walk   Phase Initial     Distance 720 feet     Walk Time 6 minutes     # of Rest Breaks 1  2:28-3:19 (Pt was over THR)     MPH 1.4     METS 1.7     RPE 11     Perceived Dyspnea  0     VO2 Peak 6     Symptoms No     Resting HR 66 bpm     Resting BP 101/62     Resting Oxygen Saturation  97 %     Exercise Oxygen Saturation  during 6 min walk 97 %     Max Ex. HR 119 bpm     Max Ex. BP 125/80     2 Minute Post BP 125/81              Oxygen Initial Assessment:   Oxygen Re-Evaluation:   Oxygen Discharge (Final Oxygen Re-Evaluation):   Initial Exercise Prescription:  Initial Exercise Prescription - 12/13/22 1400       Date of Initial Exercise RX and Referring Provider   Date 12/13/22    Referring Provider Dr. Sullivan Lone ([name], MD covering)     Expected Discharge Date 03/08/22      NuStep   Level 1    SPM 75    Minutes 25    METs  1.7      Prescription Details   Frequency (times per week) 3    Duration Progress to 30 minutes of continuous aerobic without signs/symptoms of physical distress      Intensity   THRR 40-80% of Max Heartrate 56-111    Ratings of Perceived Exertion 11-13    Perceived Dyspnea 0-4      Progression   Progression Continue progressive overload as per policy without signs/symptoms or physical distress.      Resistance Training   Training Prescription Yes    Weight 2 lbs    Reps 10-15             Perform Capillary Blood Glucose checks as needed.  Exercise Prescription Changes:   Exercise Prescription Changes     Row Name 12/21/22 1400 01/06/23 1600           Response to Exercise   Blood Pressure (Admit) 104/72 100/58      Blood Pressure (Exercise) 104/70 106/62      Blood Pressure (Exit) 100/60 94/54      Heart Rate (Admit) 77 bpm 76 bpm      Heart Rate (Exercise) 111 bpm 119 bpm      Heart Rate (Exit) 70 bpm 72 bpm      Rating of Perceived Exertion (Exercise) 13 11      Symptoms None None      Comments Pt's first day in the CRP2 program Reviewed METs      Duration Progress to 30 minutes of  aerobic without signs/symptoms of physical distress Progress to 30 minutes of  aerobic without signs/symptoms of physical distress      Intensity THRR unchanged THRR unchanged        Progression   Progression Continue to progress workloads to maintain intensity without signs/symptoms of physical distress. Continue to progress workloads to maintain intensity without signs/symptoms of physical distress.      Average METs 1.9 1.9        Resistance Training   Training Prescription No Yes      Weight No weights on wednesdays 2 lbs      Reps -- 10-15      Time -- 10 Minutes        Interval Training   Interval Training No No        NuStep   Level 1 1      SPM 67 79      Minutes 20 25       METs 1.9 2.1               Exercise Comments:   Exercise Comments     Row Name 12/21/22 1414 01/06/23 1600 01/20/23 1620       Exercise Comments Pt's first day in the CRP2 program. Pt completed 20 minutes on Nustep with no complaints. Starting with low level intensity/duration due to patients deconditioned status. Reviewed METs. Pt progress is slow. Provided encouragment. Pt due for METs and goals review. Pt was not feeling well and not allowed to exercise. Will complete with patient upon return.              Exercise Goals and Review:   Exercise Goals     Row Name 12/13/22 1416             Exercise Goals   Increase Physical Activity Yes       Intervention Provide advice, education, support and counseling about physical activity/exercise needs.;Develop an individualized exercise prescription for  aerobic and resistive training based on initial evaluation findings, risk stratification, comorbidities and participant's personal goals.       Expected Outcomes Short Term: Attend rehab on a regular basis to increase amount of physical activity.;Long Term: Exercising regularly at least 3-5 days a week.;Long Term: Add in home exercise to make exercise part of routine and to increase amount of physical activity.       Increase Strength and Stamina Yes       Intervention Provide advice, education, support and counseling about physical activity/exercise needs.;Develop an individualized exercise prescription for aerobic and resistive training based on initial evaluation findings, risk stratification, comorbidities and participant's personal goals.       Expected Outcomes Short Term: Increase workloads from initial exercise prescription for resistance, speed, and METs.;Short Term: Perform resistance training exercises routinely during rehab and add in resistance training at home;Long Term: Improve cardiorespiratory fitness, muscular endurance and strength as measured by increased METs and  functional capacity ( )       Able to understand and use rate of perceived exertion (RPE) scale Yes       Intervention Provide education and explanation on how to use RPE scale       Expected Outcomes Short Term: Able to use RPE daily in rehab to express subjective intensity level;Long Term:  Able to use RPE to guide intensity level when exercising independently       Knowledge and understanding of Target Heart Rate Range (THRR) Yes       Intervention Provide education and explanation of THRR including how the numbers were predicted and where they are located for reference       Expected Outcomes Short Term: Able to state/look up THRR;Long Term: Able to use THRR to govern intensity when exercising independently;Short Term: Able to use daily as guideline for intensity in rehab       Understanding of Exercise Prescription Yes       Intervention Provide education, explanation, and written materials on patient's individual exercise prescription       Expected Outcomes Short Term: Able to explain program exercise prescription;Long Term: Able to explain home exercise prescription to exercise independently                Exercise Goals Re-Evaluation :  Exercise Goals Re-Evaluation     Row Name 12/21/22 1413             Exercise Goal Re-Evaluation   Exercise Goals Review Increase Physical Activity;Increase Strength and Stamina;Able to understand and use rate of perceived exertion (RPE) scale;Knowledge and understanding of Target Heart Rate Range (THRR);Understanding of Exercise Prescription       Comments Pt's first day in the CRP2 program. Pt understands the exercise Rx, RPE sclae and THRR.       Expected Outcomes Will continue to monitor patient and progress exercise workloads as tolerated.                Discharge Exercise Prescription (Final Exercise Prescription Changes):  Exercise Prescription Changes - 01/06/23 1600       Response to Exercise   Blood Pressure (Admit)  100/58    Blood Pressure (Exercise) 106/62    Blood Pressure (Exit) 94/54    Heart Rate (Admit) 76 bpm    Heart Rate (Exercise) 119 bpm    Heart Rate (Exit) 72 bpm    Rating of Perceived Exertion (Exercise) 11    Symptoms None    Comments Reviewed METs    Duration Progress  to 30 minutes of  aerobic without signs/symptoms of physical distress    Intensity THRR unchanged      Progression   Progression Continue to progress workloads to maintain intensity without signs/symptoms of physical distress.    Average METs 1.9      Resistance Training   Training Prescription Yes    Weight 2 lbs    Reps 10-15    Time 10 Minutes      Interval Training   Interval Training No      NuStep   Level 1    SPM 79    Minutes 25    METs 2.1             Nutrition:  Target Goals: Understanding of nutrition guidelines, daily intake of sodium 1500mg , cholesterol 200mg , calories 30% from fat and 7% or less from saturated fats, daily to have 5 or more servings of fruits and vegetables.  Biometrics:  Pre Biometrics - 12/13/22 1115       Pre Biometrics   Waist Circumference 38 inches    Hip Circumference 41 inches    Waist to Hip Ratio 0.93 %    Triceps Skinfold 16 mm    % Body Fat 25.8 %    Grip Strength 30 kg    Flexibility --   Not performed. active low back pain   Single Leg Stand 1.5 seconds              Nutrition Therapy Plan and Nutrition Goals:   Nutrition Assessments:  MEDIFICTS Score Key: >=70 Need to make dietary changes  40-70 Heart Healthy Diet <= 40 Therapeutic Level Cholesterol Diet   Flowsheet Row CARDIAC REHAB PHASE II EXERCISE from 12/26/2022 in [facility]  Picture Your Plate Total Score on Admission 59      Picture Your Plate Scores: <91 Unhealthy dietary pattern with much room for improvement. 41-50 Dietary pattern unlikely to meet recommendations for good health and room for  improvement. 51-60 More healthful dietary pattern, with some room for improvement.  >60 Healthy dietary pattern, although there may be some specific behaviors that could be improved.    Nutrition Goals Re-Evaluation:   Nutrition Goals Re-Evaluation:   Nutrition Goals Discharge (Final Nutrition Goals Re-Evaluation):   Psychosocial: Target Goals: Acknowledge presence or absence of significant depression and/or stress, maximize coping skills, provide positive support system. Participant is able to verbalize types and ability to use techniques and skills needed for reducing stress and depression.  Initial Review & Psychosocial Screening:  Initial Psych Review & Screening - 12/13/22 1422       Initial Review   Current issues with Current Depression;Current Stress Concerns    Source of Stress Concerns Financial;Chronic Illness;Unable to participate in former interests or hobbies    Comments Pt has financial stress due to the war in [place]. Pt has lost his pension as a reult of the war. He has had friends die and the strife in his homeland worries him. Due to his chroinc illness, he does not do things he used to enjoy. Refuses any counseling at his time.      Family Dynamics   Good Support System? Yes   Has his spouse for support.     Barriers   Psychosocial barriers to participate in program The patient should benefit from training in stress management and relaxation.      Screening Interventions   Interventions Encouraged to exercise  Expected Outcomes Long Term Goal: Stressors or current issues are controlled or eliminated.;Short Term goal: Utilizing psychosocial counselor, staff and physician to assist with identification of specific Stressors or current issues interfering with healing process. Setting desired goal for each stressor or current issue identified.;Short Term goal: Identification and review with participant of any Quality of Life or Depression concerns found by scoring  the questionnaire.;Long Term goal: The participant improves quality of Life and PHQ9 Scores as seen by post scores and/or verbalization of changes             Quality of Life Scores:  Quality of Life - 12/13/22 1427       Quality of Life   Select Quality of Life      Quality of Life Scores   Health/Function Pre 16.57 %    Socioeconomic Pre 16.75 %    Psych/Spiritual Pre 18.93 %    Family Pre 25.3 %    GLOBAL Pre 18.38 %            Scores of 19 and below usually indicate a poorer quality of life in these areas.  A difference of  2-3 points is a clinically meaningful difference.  A difference of 2-3 points in the total score of the Quality of Life Index has been associated with significant improvement in overall quality of life, self-image, physical symptoms, and general health in studies assessing change in quality of life.  PHQ-9: Review Flowsheet  More data exists      12/13/2022 08/05/2015 06/25/2015 04/24/2015 12/24/2014  Depression screen PHQ 2/9  Decreased Interest 1 0 0 0 0  Down, Depressed, Hopeless 1 0 0 0 0  PHQ - 2 Score 2 0 0 0 0  Altered sleeping 0 - - - -  Tired, decreased energy 2 - - - -  Change in appetite 2 - - - -  Feeling bad or failure about yourself  0 - - - -  Trouble concentrating 3 - - - -  Moving slowly or fidgety/restless 0 - - - -  Suicidal thoughts 0 - - - -  PHQ-9 Score 9 - - - -  Difficult doing work/chores Not difficult at all - - - -   Interpretation of Total Score  Total Score Depression Severity:  1-4 = Minimal depression, 5-9 = Mild depression, 10-14 = Moderate depression, 15-19 = Moderately severe depression, 20-27 = Severe depression   Psychosocial Evaluation and Intervention:   Psychosocial Re-Evaluation:  Psychosocial Re-Evaluation     Row Name 12/21/22 1411 01/10/23 0809 02/03/23 1118         Psychosocial Re-Evaluation   Current issues with Current Depression;Current Stress Concerns Current Depression;Current Stress  Concerns Current Depression;Current Stress Concerns     Comments Stephen Hendricks did not voice any increased concerns or stressors on his first day of exercise at cardiac rehab. Reviewed quality of life on 01/06/23. Patient quality of life slightly altered by physical constraints which limits ability to perform as prior to recent cardiac illness. 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 Dr Eulis Foster says his depression is manageable. Dr Eulis Foster says he has a strong faith and belief in God. Dr Eulis Foster does not want this review forwarded to his primary care provider at this time Last day of exercise was on 01/06/23. Has been out due to feeling lightheaded     Expected Outcomes [place] will have controlled or decreased  depression or stress upon completion of cardiac rehab Brallan will have controlled or decreased depression or stress upon completion of cardiac rehab Juanjose will have controlled or decreased depression or stress upon completion of cardiac rehab     Interventions Stress management education;Encouraged to attend Cardiac Rehabilitation for the exercise;Relaxation education Stress management education;Encouraged to attend Cardiac Rehabilitation for the exercise;Relaxation education Stress management education;Encouraged to attend Cardiac Rehabilitation for the exercise;Relaxation education     Continue Psychosocial Services  Follow up required by staff Follow up required by staff Follow up required by staff       Initial Review   Source of Stress Concerns Family;Unable to participate in former interests or hobbies;Retirement/disability;Financial;Unable to perform yard/household activities Family;Unable to participate in former interests or hobbies;Retirement/disability;Financial;Unable to perform yard/household activities Family;Unable to participate in former interests or hobbies;Retirement/disability;Financial;Unable to  perform yard/household activities     Comments Will continue to monitor and offer support as needed. Will continue to monitor and offer support as needed. Will continue to monitor and offer support as needed.              Psychosocial Discharge (Final Psychosocial Re-Evaluation):  Psychosocial Re-Evaluation - 02/03/23 1118       Psychosocial Re-Evaluation   Current issues with Current Depression;Current Stress Concerns    Comments Last day of exercise was on 01/06/23. Has been out due to feeling lightheaded    Expected Outcomes Brandonlee will have controlled or decreased depression or stress upon completion of cardiac rehab    Interventions Stress management education;Encouraged to attend Cardiac Rehabilitation for the exercise;Relaxation education    Continue Psychosocial Services  Follow up required by staff      Initial Review   Source of Stress Concerns Family;Unable to participate in former interests or hobbies;Retirement/disability;Financial;Unable to perform yard/household activities    Comments Will continue to monitor and offer support as needed.             Vocational Rehabilitation: Provide vocational rehab assistance to qualifying candidates.   Vocational Rehab Evaluation & Intervention:  Vocational Rehab - 12/13/22 1428       Initial Vocational Rehab Evaluation & Intervention   Assessment shows need for Vocational Rehabilitation No   Pt is retired            Education: Education Goals: Education classes will be provided on a weekly basis, covering required topics. Participant will state understanding/return demonstration of topics presented.    Education     Row Name 01/06/23 1400     Education   Cardiac Education Topics Pritikin   Licensed conveyancer Nutrition   Nutrition Dining Out - Part 1   Instruction Review Code 1- Verbalizes Understanding   Class Start Time 1358   Class Stop Time 1442   Class  Time Calculation (min) 44 min            Core Videos: Exercise    Move It!  Clinical staff conducted group or individual video education with verbal and written material and guidebook.  Patient learns the recommended Pritikin exercise program. Exercise with the goal of living a long, healthy life. Some of the health benefits of exercise include controlled diabetes, healthier blood pressure levels, improved cholesterol levels, improved heart and lung capacity, improved sleep, and better body composition. Everyone should speak with their doctor before starting or changing an exercise routine.  Biomechanical Limitations Clinical staff conducted group or individual video  education with verbal and written material and guidebook.  Patient learns how biomechanical limitations can impact exercise and how we can mitigate and possibly overcome limitations to have an impactful and balanced exercise routine.  Body Composition Clinical staff conducted group or individual video education with verbal and written material and guidebook.  Patient learns that body composition (ratio of muscle mass to fat mass) is a key component to assessing overall fitness, rather than body weight alone. Increased fat mass, especially visceral belly fat, can put [place] at increased risk for metabolic syndrome, type 2 diabetes, heart disease, and even death. It is recommended to combine diet and exercise (cardiovascular and resistance training) to improve your body composition. Seek guidance from your physician and exercise physiologist before implementing an exercise routine.  Exercise Action Plan Clinical staff conducted group or individual video education with verbal and written material and guidebook.  Patient learns the recommended strategies to achieve and enjoy long-term exercise adherence, including variety, self-motivation, self-efficacy, and positive decision making. Benefits of exercise include fitness, good health,  weight management, more energy, better sleep, less stress, and overall well-being.  Medical   Heart Disease Risk Reduction Clinical staff conducted group or individual video education with verbal and written material and guidebook.  Patient learns our heart is our most vital organ as it circulates oxygen, nutrients, white blood cells, and hormones throughout the entire body, and carries waste away. Data supports a plant-based eating plan like the Pritikin Program for its effectiveness in slowing progression of and reversing heart disease. The video provides a number of recommendations to address heart disease.   Metabolic Syndrome and Belly Fat  Clinical staff conducted group or individual video education with verbal and written material and guidebook.  Patient learns what metabolic syndrome is, how it leads to heart disease, and how one can reverse it and keep it from coming back. You have metabolic syndrome if you have 3 of the following 5 criteria: abdominal obesity, high blood pressure, high triglycerides, low HDL cholesterol, and high blood sugar.  Hypertension and Heart Disease Clinical staff conducted group or individual video education with verbal and written material and guidebook.  Patient learns that high blood pressure, or hypertension, is very common in the [place]. Hypertension is largely due to excessive salt intake, but other important risk factors include being overweight, physical inactivity, drinking too much alcohol, smoking, and not eating enough potassium from fruits and vegetables. High blood pressure is a leading risk factor for heart attack, stroke, congestive heart failure, dementia, kidney failure, and premature death. Long-term effects of excessive salt intake include stiffening of the arteries and thickening of heart muscle and organ damage. Recommendations include ways to reduce hypertension and the risk of heart disease.  Diseases of Our Time - Focusing on  Diabetes Clinical staff conducted group or individual video education with verbal and written material and guidebook.  Patient learns why the best way to stop diseases of our time is prevention, through food and other lifestyle changes. Medicine (such as prescription pills and surgeries) is often only a Band-Aid on the problem, not a long-term solution. Most common diseases of our time include obesity, type 2 diabetes, hypertension, heart disease, and cancer. The Pritikin Program is recommended and has been proven to help reduce, reverse, and/or prevent the damaging effects of metabolic syndrome.  Nutrition   Overview of the Pritikin Eating Plan  Clinical staff conducted group or individual video education with verbal and written material and guidebook.  Patient learns  about the Pritikin Eating Plan for disease risk reduction. The Pritikin Eating Plan emphasizes a wide variety of unrefined, minimally-processed carbohydrates, like fruits, vegetables, whole grains, and legumes. Go, Caution, and Stop food choices are explained. Plant-based and lean animal proteins are emphasized. Rationale provided for low sodium intake for blood pressure control, low added sugars for blood sugar stabilization, and low added fats and oils for coronary artery disease risk reduction and weight management.  Calorie Density  Clinical staff conducted group or individual video education with verbal and written material and guidebook.  Patient learns about calorie density and how it impacts the Pritikin Eating Plan. Knowing the characteristics of the food you choose will help you decide whether those foods will lead to weight gain or weight loss, and whether you want to consume more or less of them. Weight loss is usually a side effect of the Pritikin Eating Plan because of its focus on low calorie-dense foods.  Label Reading  Clinical staff conducted group or individual video education with verbal and written material and  guidebook.  Patient learns about the Pritikin recommended label reading guidelines and corresponding recommendations regarding calorie density, added sugars, sodium content, and whole grains.  Dining Out - Part 1  Clinical staff conducted group or individual video education with verbal and written material and guidebook.  Patient learns that restaurant meals can be sabotaging because they can be so high in calories, fat, sodium, and/or sugar. Patient learns recommended strategies on how to positively address this and avoid unhealthy pitfalls.  Facts on Fats  Clinical staff conducted group or individual video education with verbal and written material and guidebook.  Patient learns that lifestyle modifications can be just as effective, if not more so, as many medications for lowering your risk of heart disease. A Pritikin lifestyle can help to reduce your risk of inflammation and atherosclerosis (cholesterol build-up, or plaque, in the artery walls). Lifestyle interventions such as dietary choices and physical activity address the cause of atherosclerosis. A review of the types of fats and their impact on blood cholesterol levels, along with dietary recommendations to reduce fat intake is also included.  Nutrition Action Plan  Clinical staff conducted group or individual video education with verbal and written material and guidebook.  Patient learns how to incorporate Pritikin recommendations into their lifestyle. Recommendations include planning and keeping personal health goals in mind as an important part of their success.  Healthy Mind-Set    Healthy Minds, Bodies, Hearts  Clinical staff conducted group or individual video education with verbal and written material and guidebook.  Patient learns how to identify when they are stressed. Video will discuss the impact of that stress, as well as the many benefits of stress management. Patient will also be introduced to stress management techniques.  The way we think, act, and feel has an impact on our hearts.  How Our Thoughts Can Heal Our Hearts  Clinical staff conducted group or individual video education with verbal and written material and guidebook.  Patient learns that negative thoughts can cause depression and anxiety. This can result in negative lifestyle behavior and serious health problems. Cognitive behavioral therapy is an effective method to help control our thoughts in order to change and improve our emotional outlook.  Additional Videos:  Exercise    Improving Performance  Clinical staff conducted group or individual video education with verbal and written material and guidebook.  Patient learns to use a non-linear approach by alternating intensity levels and lengths of time  spent exercising to help burn more calories and lose more body fat. Cardiovascular exercise helps improve heart health, metabolism, hormonal balance, blood sugar control, and recovery from fatigue. Resistance training improves strength, endurance, balance, coordination, reaction time, metabolism, and muscle mass. Flexibility exercise improves circulation, posture, and balance. Seek guidance from your physician and exercise physiologist before implementing an exercise routine and learn your capabilities and proper form for all exercise.  Introduction to Yoga  Clinical staff conducted group or individual video education with verbal and written material and guidebook.  Patient learns about yoga, a discipline of the coming together of mind, breath, and body. The benefits of yoga include improved flexibility, improved range of motion, better posture and core strength, increased lung function, weight loss, and positive self-image. Yoga's heart health benefits include lowered blood pressure, healthier heart rate, decreased cholesterol and triglyceride levels, improved immune function, and reduced stress. Seek guidance from your physician and exercise physiologist  before implementing an exercise routine and learn your capabilities and proper form for all exercise.  Medical   Aging: Enhancing Your Quality of Life  Clinical staff conducted group or individual video education with verbal and written material and guidebook.  Patient learns key strategies and recommendations to stay in good physical health and enhance quality of life, such as prevention strategies, having an advocate, securing a Health Care Proxy and Power of Attorney, and keeping a list of medications and system for tracking them. It also discusses how to avoid risk for bone loss.  Biology of Weight Control  Clinical staff conducted group or individual video education with verbal and written material and guidebook.  Patient learns that weight gain occurs because we consume more calories than we burn (eating more, moving less). Even if your body weight is normal, you may have higher ratios of fat compared to muscle mass. Too much body fat puts you at increased risk for cardiovascular disease, heart attack, stroke, type 2 diabetes, and obesity-related cancers. In addition to exercise, following the Pritikin Eating Plan can help reduce your risk.  Decoding Lab Results  Clinical staff conducted group or individual video education with verbal and written material and guidebook.  Patient learns that lab test reflects one measurement whose values change over time and are influenced by many factors, including medication, stress, sleep, exercise, food, hydration, pre-existing medical conditions, and more. It is recommended to use the knowledge from this video to become more involved with your lab results and evaluate your numbers to speak with your doctor.   Diseases of Our Time - Overview  Clinical staff conducted group or individual video education with verbal and written material and guidebook.  Patient learns that according to the CDC, 50% to 70% of chronic diseases (such as obesity, type 2 diabetes,  elevated lipids, hypertension, and heart disease) are avoidable through lifestyle improvements including healthier food choices, listening to satiety cues, and increased physical activity.  Sleep Disorders Clinical staff conducted group or individual video education with verbal and written material and guidebook.  Patient learns how good quality and duration of sleep are important to overall health and well-being. Patient also learns about sleep disorders and how they impact health along with recommendations to address them, including discussing with a physician.  Nutrition  Dining Out - Part 2 Clinical staff conducted group or individual video education with verbal and written material and guidebook.  Patient learns how to plan ahead and communicate in order to maximize their dining experience in a healthy and nutritious manner.  Included are recommended food choices based on the type of restaurant the patient is visiting.   Fueling a Banker conducted group or individual video education with verbal and written material and guidebook.  There is a strong connection between our food choices and our health. Diseases like obesity and type 2 diabetes are very prevalent and are in large-part due to lifestyle choices. The Pritikin Eating Plan provides plenty of food and hunger-curbing satisfaction. It is easy to follow, affordable, and helps reduce health risks.  Menu Workshop  Clinical staff conducted group or individual video education with verbal and written material and guidebook.  Patient learns that restaurant meals can sabotage health goals because they are often packed with calories, fat, sodium, and sugar. Recommendations include strategies to plan ahead and to communicate with the manager, chef, or server to help order a healthier meal.  Planning Your Eating Strategy  Clinical staff conducted group or individual video education with verbal and written material and  guidebook.  Patient learns about the Pritikin Eating Plan and its benefit of reducing the risk of disease. The Pritikin Eating Plan does not focus on calories. Instead, it emphasizes high-quality, nutrient-rich foods. By knowing the characteristics of the foods, we choose, we can determine their calorie density and make informed decisions.  Targeting Your Nutrition Priorities  Clinical staff conducted group or individual video education with verbal and written material and guidebook.  Patient learns that lifestyle habits have a tremendous impact on disease risk and progression. This video provides eating and physical activity recommendations based on your personal health goals, such as reducing LDL cholesterol, losing weight, preventing or controlling type 2 diabetes, and reducing high blood pressure.  Vitamins and Minerals  Clinical staff conducted group or individual video education with verbal and written material and guidebook.  Patient learns different ways to obtain key vitamins and minerals, including through a recommended healthy diet. It is important to discuss all supplements you take with your doctor.   Healthy Mind-Set    Smoking Cessation  Clinical staff conducted group or individual video education with verbal and written material and guidebook.  Patient learns that cigarette smoking and tobacco addiction pose a serious health risk which affects millions of people. Stopping smoking will significantly reduce the risk of heart disease, lung disease, and many forms of cancer. Recommended strategies for quitting are covered, including working with your doctor to develop a successful plan.  Culinary   Becoming a Set designer conducted group or individual video education with verbal and written material and guidebook.  Patient learns that cooking at home can be healthy, cost-effective, quick, and puts them in control. Keys to cooking healthy recipes will include looking  at your recipe, assessing your equipment needs, planning ahead, making it simple, choosing cost-effective seasonal ingredients, and limiting the use of added fats, salts, and sugars.  Cooking - Breakfast and Snacks  Clinical staff conducted group or individual video education with verbal and written material and guidebook.  Patient learns how important breakfast is to satiety and nutrition through the entire day. Recommendations include key foods to eat during breakfast to help stabilize blood sugar levels and to prevent overeating at meals later in the day. Planning ahead is also a key component.  Cooking - Educational psychologist conducted group or individual video education with verbal and written material and guidebook.  Patient learns eating strategies to improve overall health, including an approach to cook more at  home. Recommendations include thinking of animal protein as a side on your plate rather than center stage and focusing instead on lower calorie dense options like vegetables, fruits, whole grains, and plant-based proteins, such as beans. Making sauces in large quantities to freeze for later and leaving the skin on your vegetables are also recommended to maximize your experience.  Cooking - Healthy Salads and Dressing Clinical staff conducted group or individual video education with verbal and written material and guidebook.  Patient learns that vegetables, fruits, whole grains, and legumes are the foundations of the Pritikin Eating Plan. Recommendations include how to incorporate each of these in flavorful and healthy salads, and how to create homemade salad dressings. Proper handling of ingredients is also covered. Cooking - Soups and [employer] - Soups and Desserts Clinical staff conducted group or individual video education with verbal and written material and guidebook.  Patient learns that Pritikin soups and desserts make for easy, nutritious, and delicious snacks  and meal components that are low in sodium, fat, sugar, and calorie density, while high in vitamins, minerals, and filling fiber. Recommendations include simple and healthy ideas for soups and desserts.   Overview     The Pritikin Solution Program Overview Clinical staff conducted group or individual video education with verbal and written material and guidebook.  Patient learns that the results of the Pritikin Program have been documented in more than 100 articles published in peer-reviewed journals, and the benefits include reducing risk factors for (and, in some cases, even reversing) high cholesterol, high blood pressure, type 2 diabetes, obesity, and more! An overview of the three key pillars of the Pritikin Program will be covered: eating well, doing regular exercise, and having a healthy mind-set.  WORKSHOPS  Exercise: Exercise Basics: Building Your Action Plan Clinical staff led group instruction and group discussion with PowerPoint presentation and patient guidebook. To enhance the learning environment the use of posters, models and videos may be added. At the conclusion of this workshop, patients will comprehend the difference between physical activity and exercise, as well as the benefits of incorporating both, into their routine. Patients will understand the FITT (Frequency, Intensity, Time, and Type) principle and how to use it to build an exercise action plan. In addition, safety concerns and other considerations for exercise and cardiac rehab will be addressed by the presenter. The purpose of this lesson is to promote a comprehensive and effective weekly exercise routine in order to improve patients' overall level of fitness.   Managing Heart Disease: Your Path to a Healthier Heart Clinical staff led group instruction and group discussion with PowerPoint presentation and patient guidebook. To enhance the learning environment the use of posters, models and videos may be added.At the  conclusion of this workshop, patients will understand the anatomy and physiology of the heart. Additionally, they will understand how Pritikin's three pillars impact the risk factors, the progression, and the management of heart disease.  The purpose of this lesson is to provide a high-level overview of the heart, heart disease, and how the Pritikin lifestyle positively impacts risk factors.  Exercise Biomechanics Clinical staff led group instruction and group discussion with PowerPoint presentation and patient guidebook. To enhance the learning environment the use of posters, models and videos may be added. Patients will learn how the structural parts of their bodies function and how these functions impact their daily activities, movement, and exercise. Patients will learn how to promote a neutral spine, learn how to manage pain, and identify ways  to improve their physical movement in order to promote healthy living. The purpose of this lesson is to expose patients to common physical limitations that impact physical activity. Participants will learn practical ways to adapt and manage aches and pains, and to minimize their effect on regular exercise. Patients will learn how to maintain good posture while sitting, walking, and lifting.  Balance Training and Fall Prevention  Clinical staff led group instruction and group discussion with PowerPoint presentation and patient guidebook. To enhance the learning environment the use of posters, models and videos may be added. At the conclusion of this workshop, patients will understand the importance of their sensorimotor skills (vision, proprioception, and the vestibular system) in maintaining their ability to balance as they age. Patients will apply a variety of balancing exercises that are appropriate for their current level of function. Patients will understand the common causes for poor balance, possible solutions to these problems, and ways to  modify their physical environment in order to minimize their fall risk. The purpose of this lesson is to teach patients about the importance of maintaining balance as they age and ways to minimize their risk of falling.  WORKSHOPS   Nutrition:  Fueling a Ship broker led group instruction and group discussion with PowerPoint presentation and patient guidebook. To enhance the learning environment the use of posters, models and videos may be added. Patients will review the foundational principles of the Pritikin Eating Plan and understand what constitutes a serving size in each of the food groups. Patients will also learn Pritikin-friendly foods that are better choices when away from home and review make-ahead meal and snack options. Calorie density will be reviewed and applied to three nutrition priorities: weight maintenance, weight loss, and weight gain. The purpose of this lesson is to reinforce (in a group setting) the key concepts around what patients are recommended to eat and how to apply these guidelines when away from home by planning and selecting Pritikin-friendly options. Patients will understand how calorie density may be adjusted for different weight management goals.  Mindful Eating  Clinical staff led group instruction and group discussion with PowerPoint presentation and patient guidebook. To enhance the learning environment the use of posters, models and videos may be added. Patients will briefly review the concepts of the Pritikin Eating Plan and the importance of low-calorie dense foods. The concept of mindful eating will be introduced as well as the importance of paying attention to internal hunger signals. Triggers for non-hunger eating and techniques for dealing with triggers will be explored. The purpose of this lesson is to provide patients with the opportunity to review the basic principles of the Pritikin Eating Plan, discuss the value of eating mindfully and how to  measure internal cues of hunger and fullness using the Hunger Scale. Patients will also discuss reasons for non-hunger eating and learn strategies to use for controlling emotional eating.  Targeting Your Nutrition Priorities Clinical staff led group instruction and group discussion with PowerPoint presentation and patient guidebook. To enhance the learning environment the use of posters, models and videos may be added. Patients will learn how to determine their genetic susceptibility to disease by reviewing their family history. Patients will gain insight into the importance of diet as part of an overall healthy lifestyle in mitigating the impact of genetics and other environmental insults. The purpose of this lesson is to provide patients with the opportunity to assess their personal nutrition priorities by looking at their family history, their own health  history and current risk factors. Patients will also be able to discuss ways of prioritizing and modifying the Pritikin Eating Plan for their highest risk areas  Menu  Clinical staff led group instruction and group discussion with PowerPoint presentation and patient guidebook. To enhance the learning environment the use of posters, models and videos may be added. Using menus brought in from [employer], or printed from [employer], patients will apply the Pritikin dining out guidelines that were presented in the [employer] video. Patients will also be able to practice these guidelines in a variety of provided scenarios. The purpose of this lesson is to provide patients with the opportunity to practice hands-on learning of the Pritikin Dining Out guidelines with actual menus and practice scenarios.  Label Reading Clinical staff led group instruction and group discussion with PowerPoint presentation and patient guidebook. To enhance the learning environment the use of posters, models and videos may be added. Patients will review  and discuss the Pritikin label reading guidelines presented in Pritikin's Label Reading Educational series video. Using fool labels brought in from local grocery stores and markets, patients will apply the label reading guidelines and determine if the packaged food meet the Pritikin guidelines. The purpose of this lesson is to provide patients with the opportunity to review, discuss, and practice hands-on learning of the Pritikin Label Reading guidelines with actual packaged food labels. Cooking School  Pritikin's [employer] are designed to teach patients ways to prepare quick, simple, and affordable recipes at home. The importance of nutrition's role in chronic disease risk reduction is reflected in its emphasis in the overall Pritikin program. By learning how to prepare essential core Pritikin Eating Plan recipes, patients will increase control over what they eat; be able to customize the flavor of foods without the use of added salt, sugar, or fat; and improve the quality of the food they consume. By learning a set of core recipes which are easily assembled, quickly prepared, and affordable, patients are more likely to prepare more healthy foods at home. These workshops focus on convenient breakfasts, simple entres, side dishes, and desserts which can be prepared with minimal effort and are consistent with nutrition recommendations for cardiovascular risk reduction. Cooking [employer] are taught by a Armed forces logistics/support/administrative officer (RD) who has been trained by the [employer]. The chef or RD has a clear understanding of the importance of minimizing - if not completely eliminating - added fat, sugar, and sodium in recipes. Throughout the series of Cooking School Workshop sessions, patients will learn about healthy ingredients and efficient methods of cooking to build confidence in their capability to prepare    Cooking School weekly topics:  Adding Flavor- Sodium-Free  Fast  and Healthy Breakfasts  Powerhouse Plant-Based Proteins  Satisfying Salads and Dressings  Simple Sides and Sauces  [employer]-Spot[employer] Zones  Delicious Desserts  Savory Soups  [employer] - Meals in a Astronomer Appetizers and Snacks  Comforting Weekend Breakfasts  One-Pot Wonders   Fast Evening Meals  Landscape architect Your Pritikin Plate  WORKSHOPS   Healthy Mindset (Psychosocial):  Focused Goals, Sustainable Changes Clinical staff led group instruction and group discussion with PowerPoint presentation and patient guidebook. To enhance the learning environment the use of posters, models and videos may be added. Patients will be able to apply effective goal setting strategies to establish at least one personal goal, and then take consistent, meaningful action toward that  goal. They will learn to identify common barriers to achieving personal goals and develop strategies to overcome them. Patients will also gain an understanding of how our mind-set can impact our ability to achieve goals and the importance of cultivating a positive and growth-oriented mind-set. The purpose of this lesson is to provide patients with a deeper understanding of how to set and achieve personal goals, as well as the tools and strategies needed to overcome common obstacles which may arise along the way.  From Head to Heart: The Power of a Healthy Outlook  Clinical staff led group instruction and group discussion with PowerPoint presentation and patient guidebook. To enhance the learning environment the use of posters, models and videos may be added. Patients will be able to recognize and describe the impact of emotions and mood on physical health. They will discover the importance of self-care and explore self-care practices which may work for them. Patients will also learn how to utilize the 4 C's to cultivate a healthier outlook and better manage stress and  challenges. The purpose of this lesson is to demonstrate to patients how a healthy outlook is an essential part of maintaining good health, especially as they continue their cardiac rehab journey.  Healthy Sleep for a Healthy Heart Clinical staff led group instruction and group discussion with PowerPoint presentation and patient guidebook. To enhance the learning environment the use of posters, models and videos may be added. At the conclusion of this workshop, patients will be able to demonstrate knowledge of the importance of sleep to overall health, well-being, and quality of life. They will understand the symptoms of, and treatments for, common sleep disorders. Patients will also be able to identify daytime and nighttime behaviors which impact sleep, and they will be able to apply these tools to help manage sleep-related challenges. The purpose of this lesson is to provide patients with a general overview of sleep and outline the importance of quality sleep. Patients will learn about a few of the most common sleep disorders. Patients will also be introduced to the concept of "sleep hygiene," and discover ways to self-manage certain sleeping problems through simple daily behavior changes. Finally, the workshop will motivate patients by clarifying the links between quality sleep and their goals of heart-healthy living.   Recognizing and Reducing Stress Clinical staff led group instruction and group discussion with PowerPoint presentation and patient guidebook. To enhance the learning environment the use of posters, models and videos may be added. At the conclusion of this workshop, patients will be able to understand the types of stress reactions, differentiate between acute and chronic stress, and recognize the impact that chronic stress has on their health. They will also be able to apply different coping mechanisms, such as reframing negative self-talk. Patients will have the opportunity to practice a  variety of stress management techniques, such as deep abdominal breathing, progressive muscle relaxation, and/or guided imagery.  The purpose of this lesson is to educate patients on the role of stress in their lives and to provide healthy techniques for coping with it.  Learning Barriers/Preferences:  Learning Barriers/Preferences - 12/13/22 1427       Learning Barriers/Preferences   Learning Barriers Sight    Learning Preferences Audio;Computer/Internet;Group Instruction;Individual Instruction;Pictoral;Skilled Demonstration;Verbal Instruction;Video;Written Material             Education Topics:  Knowledge Questionnaire Score:  Knowledge Questionnaire Score - 12/13/22 1428       Knowledge Questionnaire Score   Pre Score 21/24  Core Components/Risk Factors/Patient Goals at Admission:  Personal Goals and Risk Factors at Admission - 12/13/22 1428       Core Components/Risk Factors/Patient Goals on Admission   Heart Failure Yes    Intervention Provide a combined exercise and nutrition program that is supplemented with education, support and counseling about heart failure. Directed toward relieving symptoms such as shortness of breath, decreased exercise tolerance, and extremity edema.    Expected Outcomes Improve functional capacity of life;Short term: Attendance in program 2-3 days a week with increased exercise capacity. Reported lower sodium intake. Reported increased fruit and vegetable intake. Reports medication compliance.;Short term: Daily weights obtained and reported for increase. Utilizing diuretic protocols set by physician.;Long term: Adoption of self-care skills and reduction of barriers for early signs and symptoms recognition and intervention leading to self-care maintenance.    Hypertension Yes    Intervention Provide education on lifestyle modifcations including regular physical activity/exercise, weight management, moderate sodium restriction and  increased consumption of fresh fruit, vegetables, and low fat dairy, alcohol moderation, and smoking cessation.;Monitor prescription use compliance.    Expected Outcomes Short Term: Continued assessment and intervention until BP is < 140/60mm HG in hypertensive participants. < 130/71mm HG in hypertensive participants with diabetes, heart failure or chronic kidney disease.;Long Term: Maintenance of blood pressure at goal levels.    Lipids Yes    Intervention Provide education and support for participant on nutrition & aerobic/resistive exercise along with prescribed medications to achieve LDL 70mg , HDL >40mg .    Expected Outcomes Short Term: Participant states understanding of desired cholesterol values and is compliant with medications prescribed. Participant is following exercise prescription and nutrition guidelines.;Long Term: Cholesterol controlled with medications as prescribed, with individualized exercise RX and with personalized nutrition plan. Value goals: LDL < 70mg , HDL > 40 mg.    Stress Yes    Intervention Offer individual and/or small group education and counseling on adjustment to heart disease, stress management and health-related lifestyle change. Teach and support self-help strategies.;Refer participants experiencing significant psychosocial distress to appropriate mental health specialists for further evaluation and treatment. When possible, include family members and significant others in education/counseling sessions.    Expected Outcomes Short Term: Participant demonstrates changes in health-related behavior, relaxation and other stress management skills, ability to obtain effective social support, and compliance with psychotropic medications if prescribed.;Long Term: Emotional wellbeing is indicated by absence of clinically significant psychosocial distress or social isolation.             Core Components/Risk Factors/Patient Goals Review:   Goals and Risk Factor Review      Row Name 12/21/22 1416 01/10/23 0817 02/03/23 1122         Core Components/Risk Factors/Patient Goals Review   Personal Goals Review Weight Management/Obesity;Hypertension;Lipids;Stress Weight Management/Obesity;Hypertension;Lipids;Stress Weight Management/Obesity;Hypertension;Lipids;Stress     Review Evertt started cardiac rehab on 12/21/22. Barnard did well with exercise for his fitness level. Vital signs were stable. Jaydien did well with exercise for his fitness level. Vital signs and weight has been stable. Vergil says participating in cardiac rehab has been helpful so far. Mahad has been absent due to feeling lightheaded. Saw Alice Rieger PAC at [facility] on 02/01/23. Medications have been adjusted. Kerwin will return to exercise at cardiac rehab when his symptoms are better..     Expected Outcomes Constant will continue to particpate in cardiac rehab for exercise, nutrtiion and lifestyle modifications Toussaint will continue to particpate in cardiac rehab for exercise, nutrtiion and lifestyle modifications Cas will continue  to particpate in cardiac rehab for exercise, nutrtiion and lifestyle modifications              Core Components/Risk Factors/Patient Goals at Discharge (Final Review):   Goals and Risk Factor Review - 02/03/23 1122       Core Components/Risk Factors/Patient Goals Review   Personal Goals Review Weight Management/Obesity;Hypertension;Lipids;Stress    Review Taijon has been absent due to feeling lightheaded. Saw Alice Rieger PAC at [facility] on 02/01/23. Medications have been adjusted. Sutter will return to exercise at cardiac rehab when his symptoms are better..    Expected Outcomes [facility] will continue to particpate in cardiac rehab for exercise, nutrtiion and lifestyle modifications             ITP Comments:  ITP Comments     Row Name 12/13/22 1411 12/21/22 1409 01/10/23 0807 02/03/23 1117     ITP Comments Armanda Magic, MD:  Medical  Director. Intoduction to the [facility] Education Program/Inensive Cardiac Rehab.  Initial orientation packet reviewed with the patient. 30 Day Review. Heinrich started cardiac rehab on 12/21/22/ Fue did well with exercise for his fitness level. 30 Day Review. Ranvir has good attendance and participation in cardiac rehab 30 Day ITP Review. Othon's last day of exercise was on 01/06/23. Brexton has been absent due to feeling lightheaded.             Comments: See ITP comments.Thayer Headings RN BSN

## 2023-02-06 ENCOUNTER — Encounter (HOSPITAL_COMMUNITY): Payer: Medicare Other

## 2023-02-06 ENCOUNTER — Encounter (HOSPITAL_COMMUNITY)
Admission: RE | Admit: 2023-02-06 | Payer: Medicare Other | Source: Ambulatory Visit | Attending: Cardiology | Admitting: Cardiology

## 2023-02-08 ENCOUNTER — Encounter (HOSPITAL_COMMUNITY): Payer: Medicare Other

## 2023-02-08 ENCOUNTER — Encounter (HOSPITAL_COMMUNITY)
Admission: RE | Admit: 2023-02-08 | Payer: Medicare Other | Source: Ambulatory Visit | Attending: Cardiology | Admitting: Cardiology

## 2023-02-10 ENCOUNTER — Telehealth (HOSPITAL_COMMUNITY): Payer: Self-pay | Admitting: *Deleted

## 2023-02-10 ENCOUNTER — Encounter (HOSPITAL_COMMUNITY): Payer: Medicare Other

## 2023-02-10 NOTE — Telephone Encounter (Signed)
 Left message to call cardiac rehab.Thayer Headings RN BSN

## 2023-02-13 ENCOUNTER — Telehealth (HOSPITAL_COMMUNITY): Payer: Self-pay | Admitting: *Deleted

## 2023-02-13 ENCOUNTER — Encounter (HOSPITAL_COMMUNITY): Payer: Self-pay | Admitting: *Deleted

## 2023-02-13 ENCOUNTER — Encounter (HOSPITAL_COMMUNITY): Payer: Medicare Other

## 2023-02-13 DIAGNOSIS — I5022 Chronic systolic (congestive) heart failure: Secondary | ICD-10-CM

## 2023-02-13 NOTE — Telephone Encounter (Signed)
 Left message to call cardiac rehab.Thayer Headings RN BSN

## 2023-02-13 NOTE — Progress Notes (Signed)
 Noted hospital admission at [facility] 02/07/23-02/10/23. Patient will need clearance from PCP and neurology to return to exercise. Attempted to call patient and wife times to 2 to notify. Its noted that the patient is getting home PT.Monte Antonio RN BSN

## 2023-02-15 ENCOUNTER — Encounter (HOSPITAL_COMMUNITY): Payer: Medicare Other

## 2023-02-17 ENCOUNTER — Encounter (HOSPITAL_COMMUNITY): Payer: Medicare Other

## 2023-02-17 ENCOUNTER — Telehealth (HOSPITAL_COMMUNITY): Payer: Self-pay

## 2023-02-17 NOTE — Telephone Encounter (Signed)
Pt came to Cardiac Rehab today. Reviewed prior Cardiac RN note from Monday, 2/10, stated pt needed clearance from his PCP and neurologist to return. Instructions provided to return. Pt and wife understand without assistance. Pt denies having home/outpatient physical therapy at this time. Will have pt's primary cardiac RN f/u on Monday.

## 2023-02-20 ENCOUNTER — Encounter (HOSPITAL_COMMUNITY): Payer: Medicare Other

## 2023-02-21 ENCOUNTER — Telehealth (HOSPITAL_COMMUNITY): Payer: Self-pay | Admitting: *Deleted

## 2023-02-21 NOTE — Telephone Encounter (Signed)
 Spoke with Dr Fambrough's wife Alawiyya. Patient will need clearance from PCP prior to resuming exercise at cardiac rehab. Dr Eulis Foster declined to have home PT. Patient's wife also said that Dr Eulis Foster is interested in switching his care to [facility] cardiology care in [place]. I advised that the patient discuss with his Cardiologist at [facility] to navigate.Thayer Headings RN BSN

## 2023-02-22 ENCOUNTER — Encounter (HOSPITAL_COMMUNITY): Payer: Medicare Other

## 2023-02-24 ENCOUNTER — Encounter (HOSPITAL_COMMUNITY): Payer: Medicare Other

## 2023-02-27 ENCOUNTER — Encounter (HOSPITAL_COMMUNITY): Admission: RE | Admit: 2023-02-27 | Payer: Medicare Other | Source: Ambulatory Visit | Attending: Cardiology

## 2023-02-27 ENCOUNTER — Encounter (HOSPITAL_COMMUNITY): Payer: Medicare Other

## 2023-02-27 DIAGNOSIS — I5022 Chronic systolic (congestive) heart failure: Secondary | ICD-10-CM | POA: Insufficient documentation

## 2023-02-28 ENCOUNTER — Telehealth (HOSPITAL_COMMUNITY): Payer: Self-pay | Admitting: *Deleted

## 2023-02-28 NOTE — Telephone Encounter (Signed)
 Spoke with Mrs Stephen Hendricks. Dr Eulis Foster has a conflict with a dental appointment and will not be able to attend cardiac rehab tomorrow. Dr Eulis Foster would like to have an extension on his exsisting appointments if possible. Thayer Headings RN BSN

## 2023-03-01 ENCOUNTER — Encounter (HOSPITAL_COMMUNITY): Payer: Medicare Other

## 2023-03-03 ENCOUNTER — Encounter (HOSPITAL_COMMUNITY): Payer: Medicare Other

## 2023-03-03 ENCOUNTER — Encounter (HOSPITAL_COMMUNITY): Payer: Self-pay | Admitting: *Deleted

## 2023-03-03 ENCOUNTER — Encounter (HOSPITAL_COMMUNITY)
Admission: RE | Admit: 2023-03-03 | Discharge: 2023-03-03 | Disposition: A | Discharge status: Home / Self Care | Payer: Medicare Other | Source: Ambulatory Visit | Attending: Cardiology | Admitting: Cardiology

## 2023-03-03 DIAGNOSIS — I5022 Chronic systolic (congestive) heart failure: Secondary | ICD-10-CM

## 2023-03-03 NOTE — Progress Notes (Signed)
 Cardiac Individual Treatment Plan  Patient Details  Name: Stephen Hendricks MRN: [id number] Date of Birth: September 13, 1940 Referring Provider:   Flowsheet Row CARDIAC REHAB PHASE II ORIENTATION from 12/13/2022 in [facility]  Referring Provider Dr. Sullivan Lone ([name], MD covering)       Initial Encounter Date:  Flowsheet Row CARDIAC REHAB PHASE II ORIENTATION from 12/13/2022 in [facility]  Date 12/13/22       Visit Diagnosis: Heart failure, chronic systolic (HCC)  Patient's Home Medications on Admission:  Current Outpatient Medications:    apixaban (ELIQUIS) 5 MG TABS tablet, Take 1 tablet (5 mg total) by mouth 2 (two) times daily., Disp: 60 tablet, Rfl: 5   benzonatate (TESSALON) 100 MG capsule, Take 100 mg by mouth 3 (three) times daily. Take 1 to 2 capsules by mouth up to 3 times daily as needed for cough. (Patient not taking: Reported on 01/20/2023), Disp: , Rfl:    Cholecalciferol 25 MCG (1000 UT) TBDP, Take 25 mcg by mouth daily. 1,000 units, Disp: , Rfl:    eplerenone (INSPRA) 25 MG tablet, Take 25 mg by mouth daily., Disp: , Rfl:    famotidine (PEPCID) 20 MG tablet, TAKE 1 TABLET (20 MG TOTAL) BY MOUTH 2 (TWO) TIMES DAILY. (Patient not taking: Reported on 01/20/2023), Disp: 300 tablet, Rfl: 0   FARXIGA 10 MG TABS tablet, Take 10 mg by mouth daily., Disp: , Rfl:    finasteride (PROSCAR) 5 MG tablet, Take 1 tablet (5 mg total) by mouth daily., Disp: 150 tablet, Rfl: 0   Flaxseed, Linseed, (FLAXSEED OIL PO), Take 1 capsule by mouth daily., Disp: , Rfl:    GARLIC PO, Take 1,000 mg by mouth once as needed. Pt takes a 5,000 mg tablet - 1 tablet every day., Disp: , Rfl:    losartan (COZAAR) 25 MG tablet, Take 1 tablet (25 mg total) by mouth daily., Disp: 90 tablet, Rfl: 3   magnesium oxide (MAG-OX) 400 MG tablet, Take 1 tablet by mouth 2 (two) times daily., Disp: , Rfl:     metoprolol succinate (TOPROL-XL) 25 MG 24 hr tablet, Take 12.5 mg by mouth daily. (Patient not taking: Reported on 01/20/2023), Disp: , Rfl:    nitroGLYCERIN (NITROSTAT) 0.4 MG SL tablet, Place 0.4 mg under the tongue every 5 (five) minutes as needed., Disp: , Rfl:    omeprazole (PRILOSEC) 20 MG capsule, Take 20 mg by mouth 2 (two) times daily before a meal. (Patient not taking: Reported on 01/20/2023), Disp: , Rfl:    rosuvastatin (CRESTOR) 10 MG tablet, Take 1 tablet by mouth daily., Disp: , Rfl:    tamsulosin (FLOMAX) 0.4 MG CAPS capsule, Take 2 capsules (0.8 mg total) by mouth daily after supper. (Patient taking differently: Take 0.4 mg by mouth daily after supper. Takes one capsule daily), Disp: 300 capsule, Rfl: 0   torsemide (DEMADEX) 10 MG tablet, Take 10 mg by mouth daily as needed., Disp: , Rfl:    Travoprost, BAK Free, (TRAVATAN) 0.004 % SOLN ophthalmic solution, Place 1 drop into the right eye at bedtime., Disp: , Rfl:   Past Medical History: Past Medical History:  Diagnosis Date   Acute pulmonary embolism (HCC) 12/03/2015   Bilateral lower lobe pulmonary emboli 11/2015.   BPH (benign prostatic hyperplasia)    Cataract of right eye    CHF (congestive heart failure) (HCC)    Frozen shoulder  HTN (hypertension)    Hx of prostatitis 3.20.16   WITH E.COLI   LBBB (left bundle branch block)    Leishmaniasis    [place]   [place]   Oral candidiasis    [place]   Right tennis elbow    SOB (shortness of breath)     Tobacco Use: Social History   Tobacco Use  Smoking Status Former   Current packs/day: 0.00   Types: Cigarettes   Start date: 01/04/1956   Quit date: 01/04/1991   Years since quitting: 32.1  Smokeless Tobacco Never    Labs: Review Flowsheet       Latest Ref Rng & Units 09/11/2014 09/29/2014 11/14/2014  Labs for ITP Cardiac and Pulmonary Rehab  Cholestrol 125 - 200 mg/dL - 213  086   LDL (calc) <130 mg/dL - 578  49   HDL-C  >=46 mg/dL - 67  70   Trlycerides <150 mg/dL - 962  66   Hemoglobin A1c - 5.1  - -    Capillary Blood Glucose: Lab Results  Component Value Date   GLUCAP 91 09/11/2014     Exercise Target Goals: Exercise Program Goal: Individual exercise prescription set using results from initial 6 min walk test and THRR while considering  patient's activity barriers and safety.   Exercise Prescription Goal: Starting with aerobic activity 30 plus minutes a day, 3 days per week for initial exercise prescription. Provide home exercise prescription and guidelines that participant acknowledges understanding prior to discharge.  Activity Barriers & Risk Stratification:   6 Minute Walk:   Oxygen Initial Assessment:   Oxygen Re-Evaluation:   Oxygen Discharge (Final Oxygen Re-Evaluation):   Initial Exercise Prescription:   Perform Capillary Blood Glucose checks as needed.  Exercise Prescription Changes:   Exercise Prescription Changes     Row Name 01/06/23 1600             Response to Exercise   Blood Pressure (Admit) 100/58       Blood Pressure (Exercise) 106/62       Blood Pressure (Exit) 94/54       Heart Rate (Admit) 76 bpm       Heart Rate (Exercise) 119 bpm       Heart Rate (Exit) 72 bpm       Rating of Perceived Exertion (Exercise) 11       Symptoms None       Comments Reviewed METs       Duration Progress to 30 minutes of  aerobic without signs/symptoms of physical distress       Intensity THRR unchanged         Progression   Progression Continue to progress workloads to maintain intensity without signs/symptoms of physical distress.       Average METs 1.9         Resistance Training   Training Prescription Yes       Weight 2 lbs       Reps 10-15       Time 10 Minutes         Interval Training   Interval Training No         NuStep   Level 1       SPM 79       Minutes 25       METs 2.1                Exercise Comments:  Exercise Comments      Row Name 01/06/23 1600 01/20/23 1620         Exercise Comments Reviewed METs. Pt progress is slow. Provided encouragment. Pt due for METs and goals review. Pt was not feeling well and not allowed to exercise. Will complete with patient upon return.               Exercise Goals and Review:   Exercise Goals Re-Evaluation :    Discharge Exercise Prescription (Final Exercise Prescription Changes):  Exercise Prescription Changes - 01/06/23 1600       Response to Exercise   Blood Pressure (Admit) 100/58    Blood Pressure (Exercise) 106/62    Blood Pressure (Exit) 94/54    Heart Rate (Admit) 76 bpm    Heart Rate (Exercise) 119 bpm    Heart Rate (Exit) 72 bpm    Rating of Perceived Exertion (Exercise) 11    Symptoms None    Comments Reviewed METs    Duration Progress to 30 minutes of  aerobic without signs/symptoms of physical distress    Intensity THRR unchanged      Progression   Progression Continue to progress workloads to maintain intensity without signs/symptoms of physical distress.    Average METs 1.9      Resistance Training   Training Prescription Yes    Weight 2 lbs    Reps 10-15    Time 10 Minutes      Interval Training   Interval Training No      NuStep   Level 1    SPM 79    Minutes 25    METs 2.1             Nutrition:  Target Goals: Understanding of nutrition guidelines, daily intake of sodium 1500mg , cholesterol 200mg , calories 30% from fat and 7% or less from saturated fats, daily to have 5 or more servings of fruits and vegetables.  Biometrics:    Nutrition Therapy Plan and Nutrition Goals:  Nutrition Therapy & Goals - 03/06/23 1343       Nutrition Therapy   Diet Heart healthy diet    Drug/Food Interactions Statins/Certain Fruits      Personal Nutrition Goals   Nutrition Goal Patient to identify strategies for reducing cardiovascular risk by attending the [employer] education and nutrition series weekly.    Personal Goal #2  Patient to improve diet quality by using the plate method as a guide for meal planning to include lean protein/plant protein, fruits, vegetables, whole grains, nonfat dairy as part of a well-balanced diet.    Comments Goals not met. Stephen Hendricks has not attended Intensive cardiac rehab since 01/19/22. He has attended 1 [employer] education course. He has medical hisory of systolic chronic heart failure,CAD, CKD3, dyslipidemia. He continues follow-up with endocrinology regarding hyperthyroidism and reports unintentional weight loss of 40-50# over the last year. LDL at goal. Patient will benefit from participation in intensive cardiac rehab for nutrition, exercise, and lifestyle modification.      Intervention Plan   Intervention Prescribe, educate and counsel regarding individualized specific dietary modifications aiming towards targeted core components such as weight, hypertension, lipid management, diabetes, heart failure and other comorbidities.;Nutrition handout(s) given to patient.    Expected Outcomes Short Term Goal: Understand basic principles of dietary content, such as calories, fat, sodium, cholesterol and nutrients.;Long Term Goal: Adherence to prescribed nutrition plan.             Nutrition Assessments:  MEDIFICTS Score Key: >=70 Need  to make dietary changes  40-70 Heart Healthy Diet <= 40 Therapeutic Level Cholesterol Diet  Flowsheet Row CARDIAC REHAB PHASE II EXERCISE from 12/26/2022 in [facility]  Picture Your Plate Total Score on Admission 59      Picture Your Plate Scores: <16 Unhealthy dietary pattern with much room for improvement. 41-50 Dietary pattern unlikely to meet recommendations for good health and room for improvement. 51-60 More healthful dietary pattern, with some room for improvement.  >60 Healthy dietary pattern, although there may be some specific behaviors that could be improved.    Nutrition Goals  Re-Evaluation:  Nutrition Goals Re-Evaluation     Row Name 03/06/23 1343             Goals   Current Weight 173 lb 15.1 oz (78.9 kg)  weight from 1/3, last documentated weight in cardiac rehab       Comment A1c WNL, HDL 21, LDL 52       Expected Outcome Goals not met. Stephen Hendricks has not attended Intensive cardiac rehab since 01/19/22. He has attended 1 [employer] education course. He has medical hisory of systolic chronic heart failure,CAD, CKD3, dyslipidemia. He continues follow-up with endocrinology regarding hyperthyroidism and reports unintentional weight loss of 40-50# over the last year. LDL at goal. Patient will benefit from participation in intensive cardiac rehab for nutrition, exercise, and lifestyle modification.                Nutrition Goals Discharge (Final Nutrition Goals Re-Evaluation):  Nutrition Goals Re-Evaluation - 03/06/23 1343       Goals   Current Weight 173 lb 15.1 oz (78.9 kg)   weight from 1/3, last documentated weight in cardiac rehab   Comment A1c WNL, HDL 21, LDL 52    Expected Outcome Goals not met. Stephen Hendricks has not attended Intensive cardiac rehab since 01/19/22. He has attended 1 [employer] education course. He has medical hisory of systolic chronic heart failure,CAD, CKD3, dyslipidemia. He continues follow-up with endocrinology regarding hyperthyroidism and reports unintentional weight loss of 40-50# over the last year. LDL at goal. Patient will benefit from participation in intensive cardiac rehab for nutrition, exercise, and lifestyle modification.             Psychosocial: Target Goals: Acknowledge presence or absence of significant depression and/or stress, maximize coping skills, provide positive support system. Participant is able to verbalize types and ability to use techniques and skills needed for reducing stress and depression.  Initial Review & Psychosocial Screening:   Quality of Life Scores:  Scores of 19 and below usually indicate a poorer  quality of life in these areas.  A difference of  2-3 points is a clinically meaningful difference.  A difference of 2-3 points in the total score of the Quality of Life Index has been associated with significant improvement in overall quality of life, self-image, physical symptoms, and general health in studies assessing change in quality of life.  PHQ-9: Review Flowsheet  More data exists      12/13/2022 08/05/2015 06/25/2015 04/24/2015 12/24/2014  Depression screen PHQ 2/9  Decreased Interest 1 0 0 0 0  Down, Depressed, Hopeless 1 0 0 0 0  PHQ - 2 Score 2 0 0 0 0  Altered sleeping 0 - - - -  Tired, decreased energy 2 - - - -  Change in appetite 2 - - - -  Feeling bad or failure about yourself  0 - - - -  Trouble concentrating 3 - - - -  Moving slowly or fidgety/restless 0 - - - -  Suicidal thoughts 0 - - - -  PHQ-9 Score 9 - - - -  Difficult doing work/chores Not difficult at all - - - -   Interpretation of Total Score  Total Score Depression Severity:  1-4 = Minimal depression, 5-9 = Mild depression, 10-14 = Moderate depression, 15-19 = Moderately severe depression, 20-27 = Severe depression   Psychosocial Evaluation and Intervention:   Psychosocial Re-Evaluation:  Psychosocial Re-Evaluation     Row Name 01/10/23 0809 02/03/23 1118 03/03/23 1300         Psychosocial Re-Evaluation   Current issues with Current Depression;Current Stress Concerns Current Depression;Current Stress Concerns Current Depression;Current Stress Concerns     Comments Reviewed quality of life on 01/06/23. Patient quality of life slightly altered by physical constraints which limits ability to perform as prior to recent cardiac illness. 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 Dr Eulis Foster says his depression is manageable. Dr Eulis Foster says he has a strong faith and belief in God. Dr Eulis Foster does not want this review  forwarded to his primary care provider at this time Last day of exercise was on 01/06/23. Has been out due to feeling lightheaded Unable to reassess, remains absent     Expected Outcomes Stephen Hendricks will have controlled or decreased depression or stress upon completion of cardiac rehab Stephen Hendricks will have controlled or decreased depression or stress upon completion of cardiac rehab Stephen Hendricks will have controlled or decreased depression or stress upon completion of cardiac rehab     Interventions Stress management education;Encouraged to attend Cardiac Rehabilitation for the exercise;Relaxation education Stress management education;Encouraged to attend Cardiac Rehabilitation for the exercise;Relaxation education Stress management education;Encouraged to attend Cardiac Rehabilitation for the exercise;Relaxation education     Continue Psychosocial Services  Follow up required by staff Follow up required by staff Follow up required by staff       Initial Review   Source of Stress Concerns Family;Unable to participate in former interests or hobbies;Retirement/disability;Financial;Unable to perform yard/household activities Family;Unable to participate in former interests or hobbies;Retirement/disability;Financial;Unable to perform yard/household activities Family;Unable to participate in former interests or hobbies;Retirement/disability;Financial;Unable to perform yard/household activities     Comments Will continue to monitor and offer support as needed. Will continue to monitor and offer support as needed. Will continue to monitor and offer support as needed.              Psychosocial Discharge (Final Psychosocial Re-Evaluation):  Psychosocial Re-Evaluation - 03/03/23 1300       Psychosocial Re-Evaluation   Current issues with Current Depression;Current Stress Concerns    Comments Unable to reassess, remains absent    Expected Outcomes Stephen Hendricks will have controlled or decreased depression or stress upon completion of  cardiac rehab    Interventions Stress management education;Encouraged to attend Cardiac Rehabilitation for the exercise;Relaxation education    Continue Psychosocial Services  Follow up required by staff      Initial Review   Source of Stress Concerns Family;Unable to participate in former interests or hobbies;Retirement/disability;Financial;Unable to perform yard/household activities    Comments Will continue to monitor and offer support as needed.             Vocational Rehabilitation: Provide vocational rehab assistance to qualifying candidates.   Vocational Rehab Evaluation & Intervention:   Education: Education Goals: Education classes will  be provided on a weekly basis, covering required topics. Participant will state understanding/return demonstration of topics presented.  Learning Barriers/Preferences:   Education Topics: Hypertension, Hypertension Reduction -Define heart disease and high blood pressure. Discus how high blood pressure affects the body and ways to reduce high blood pressure.   Exercise and Your Heart -Discuss why it is important to exercise, the FITT principles of exercise, normal and abnormal responses to exercise, and how to exercise safely.   Angina -Discuss definition of angina, causes of angina, treatment of angina, and how to decrease risk of having angina.   Cardiac Medications -Review what the following cardiac medications are used for, how they affect the body, and side effects that may occur when taking the medications.  Medications include Aspirin, Beta blockers, calcium channel blockers, ACE Inhibitors, angiotensin receptor blockers, diuretics, digoxin, and antihyperlipidemics.   Congestive Heart Failure -Discuss the definition of CHF, how to live with CHF, the signs and symptoms of CHF, and how keep track of weight and sodium intake.   Heart Disease and Intimacy -Discus the effect sexual activity has on the heart, how changes occur  during intimacy as we age, and safety during sexual activity.   Smoking Cessation / COPD -Discuss different methods to quit smoking, the health benefits of quitting smoking, and the definition of COPD.   Nutrition I: Fats -Discuss the types of cholesterol, what cholesterol does to the heart, and how cholesterol levels can be controlled.   Nutrition II: Labels -Discuss the different components of food labels and how to read food label   Heart Parts/Heart Disease and PAD -Discuss the anatomy of the heart, the pathway of blood circulation through the heart, and these are affected by heart disease.   Stress I: Signs and Symptoms -Discuss the causes of stress, how stress may lead to anxiety and depression, and ways to limit stress.   Stress II: Relaxation -Discuss different types of relaxation techniques to limit stress.   Warning Signs of Stroke / TIA -Discuss definition of a stroke, what the signs and symptoms are of a stroke, and how to identify when someone is having stroke.   Knowledge Questionnaire Score:   Core Components/Risk Factors/Patient Goals at Admission:   Core Components/Risk Factors/Patient Goals Review:   Goals and Risk Factor Review     Row Name 01/10/23 0817 02/03/23 1122 03/03/23 1301         Core Components/Risk Factors/Patient Goals Review   Personal Goals Review Weight Management/Obesity;Hypertension;Lipids;Stress Weight Management/Obesity;Hypertension;Lipids;Stress Weight Management/Obesity;Hypertension;Lipids;Stress     Review Stephen Hendricks did well with exercise for his fitness level. Vital signs and weight has been stable. Stephen Hendricks says participating in cardiac rehab has been helpful so far. Stephen Hendricks has been absent due to feeling lightheaded. Saw Alice Rieger PAC at [facility] on 02/01/23. Medications have been adjusted. Stephen Hendricks will return to exercise at cardiac rehab when his symptoms are better.. Stephen Hendricks  continiues to be absent  from class.  Awaitng return. graduation date has been extended to the end of March     Expected Outcomes Stephen Hendricks will continue to particpate in cardiac rehab for exercise, nutrtiion and lifestyle modifications Stephen Hendricks will continue to particpate in cardiac rehab for exercise, nutrtiion and lifestyle modifications Stephen Hendricks will continue to particpate in cardiac rehab for exercise, nutrtiion and lifestyle modifications              Core Components/Risk Factors/Patient Goals at Discharge (Final Review):   Goals and Risk Factor Review - 03/03/23 1301  Core Components/Risk Factors/Patient Goals Review   Personal Goals Review Weight Management/Obesity;Hypertension;Lipids;Stress    Review Haston  continiues to be absent  from class. Awaitng return. graduation date has been extended to the end of March    Expected Outcomes Stephen Hendricks will continue to particpate in cardiac rehab for exercise, nutrtiion and lifestyle modifications             ITP Comments:  ITP Comments     Row Name 01/10/23 0807 02/03/23 1117 03/03/23 1258       ITP Comments 30 Day Review. Lional has good attendance and participation in cardiac rehab 30 Day ITP Review. Stelios's last day of exercise was on 01/06/23. Mithran has been absent due to feeling lightheaded. 30 Day ITP Review. Eleftherios's contijnues to be absent from cardiac rehab. Last day of attendnace was on  01/06/23.  Kodi has been cleared to return to exercise at cardiac rehab by his PCP. Awaiting patient's return to exercise.              Comments: See ITP comments.Thayer Headings RN BSN

## 2023-03-06 ENCOUNTER — Encounter (HOSPITAL_COMMUNITY): Payer: Medicare Other

## 2023-03-06 ENCOUNTER — Encounter (HOSPITAL_COMMUNITY)
Admission: RE | Admit: 2023-03-06 | Payer: Medicare Other | Source: Ambulatory Visit | Attending: Cardiology | Admitting: Cardiology

## 2023-03-08 ENCOUNTER — Encounter (HOSPITAL_COMMUNITY): Payer: Medicare Other

## 2023-03-09 ENCOUNTER — Telehealth (HOSPITAL_COMMUNITY): Payer: Self-pay | Admitting: Cardiology

## 2023-03-09 DIAGNOSIS — I509 Heart failure, unspecified: Secondary | ICD-10-CM

## 2023-03-09 DIAGNOSIS — I255 Ischemic cardiomyopathy: Secondary | ICD-10-CM

## 2023-03-09 NOTE — Telephone Encounter (Signed)
 Patients daughter called to check the status of NP referral Report records were sent 3/5 for NP appt  Referred by Virgilio Belling -records available via care everywhere

## 2023-03-09 NOTE — Addendum Note (Signed)
 Addended by: Takyra Cantrall, Milagros Reap on: 03/09/2023 04:22 PM   Modules accepted: Orders

## 2023-03-10 ENCOUNTER — Encounter (HOSPITAL_COMMUNITY): Payer: BLUE CROSS/BLUE SHIELD

## 2023-03-13 ENCOUNTER — Ambulatory Visit (HOSPITAL_COMMUNITY): Payer: BLUE CROSS/BLUE SHIELD

## 2023-03-15 ENCOUNTER — Inpatient Hospital Stay (HOSPITAL_COMMUNITY): Admission: RE | Admit: 2023-03-15 | Payer: BLUE CROSS/BLUE SHIELD | Source: Ambulatory Visit

## 2023-03-16 ENCOUNTER — Telehealth (HOSPITAL_COMMUNITY): Payer: Self-pay | Admitting: *Deleted

## 2023-03-16 NOTE — Telephone Encounter (Signed)
 Spoke with Mrs Stephen Hendricks he plans to return to exercise tomorrow. He has been feeling under the weather due to recent medication changes.Stephen Headings RN BSN

## 2023-03-17 ENCOUNTER — Telehealth (HOSPITAL_COMMUNITY): Payer: Self-pay

## 2023-03-17 ENCOUNTER — Ambulatory Visit (HOSPITAL_COMMUNITY): Payer: BLUE CROSS/BLUE SHIELD

## 2023-03-17 NOTE — Telephone Encounter (Signed)
-----   Message from Paul Smiths C sent at 03/17/2023 12:30 PM EDT ----- Regarding: Pt called out Hey,  Pt wife called and stated pt will not be able to attend CR today due to his BP.  Thanks, Shanda Bumps

## 2023-03-20 ENCOUNTER — Inpatient Hospital Stay (HOSPITAL_COMMUNITY): Admission: RE | Admit: 2023-03-20 | Payer: BLUE CROSS/BLUE SHIELD | Source: Ambulatory Visit

## 2023-03-22 ENCOUNTER — Ambulatory Visit (HOSPITAL_COMMUNITY): Payer: BLUE CROSS/BLUE SHIELD

## 2023-03-24 ENCOUNTER — Ambulatory Visit (HOSPITAL_COMMUNITY): Payer: BLUE CROSS/BLUE SHIELD

## 2023-03-27 ENCOUNTER — Inpatient Hospital Stay (HOSPITAL_COMMUNITY): Admission: RE | Admit: 2023-03-27 | Payer: BLUE CROSS/BLUE SHIELD | Source: Ambulatory Visit

## 2023-03-29 ENCOUNTER — Ambulatory Visit (HOSPITAL_COMMUNITY): Payer: BLUE CROSS/BLUE SHIELD

## 2023-03-31 ENCOUNTER — Encounter (HOSPITAL_COMMUNITY): Payer: Self-pay | Admitting: *Deleted

## 2023-03-31 ENCOUNTER — Ambulatory Visit (HOSPITAL_COMMUNITY): Payer: BLUE CROSS/BLUE SHIELD

## 2023-03-31 DIAGNOSIS — I5022 Chronic systolic (congestive) heart failure: Secondary | ICD-10-CM

## 2023-03-31 NOTE — Progress Notes (Signed)
 Stephen Hendricks attended 9 exercise and education classes between 12/13/22- 01/06/23. Stephen Hendricks was out due to hospital admission at [facility]. Stephen Hendricks was cleared to return to cardiac. Stephen Hendricks did not return to exercise and has been discharged due to non attendance.Thayer Headings RN BSN

## 2023-04-06 NOTE — Progress Notes (Signed)
 ADVANCED HF CLINIC CONSULT NOTE  Referring Physician: Dr. 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 Systolic heart failure, ischemic etiology, EF 20-25%: RHC on 11/05/21 noted reduced CO (TD 3.5 L/min) with admission weight 210 lbs, discharge weight 194 lbs. He was admitted to the hospital for HF exacerbation in [place] (where his son is a fellow) with repeat RHC on 01/12/22 showing increased PCWP (20), PA 31, RA 11, low-normal CI (Fick 2.7, TD 2.1).  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  3. Medtronic CRT-D (11/23/17) -Original device placed in [place]. New device placed in [place], [place] on 05/20/2022 (Medtronic [id number] cobalt HF CRT-D MRI). The coronary sinus lead was capped, & he had a new left bundle area pacing lead placed with a new biventricular ICD generator by Dr. Cloretta Ned. 4. Coronary artery disease - Stable per [facility] 10/20/20. No angina.  5. NSVT, frequent PVC's - No longer on Amiodarone due to amiodarone induced hyperthyroidism. Previously on Ranolazine (unclear why this was stopped).  6. Dyslipidemia - As of 02/01/2023, TC 92, HDL 21, LDL 52, TG 103. On Rosuvastatin.  8. CKD 3a - Baseline Cr 1.2 to 1.4  9. Extensive smoking history - 88 year pack history, quit age 9. PFT's (10/15/20) with normal spirometry, normal lung volumes, and mild reduction in diffusing capacity.  10. Depression  11. History of frequent Alcohol use - Previously drank (at least) 2 beverages daily (wife says more). 12. Prior pulmonary emboli - On Eliquis. No reports or evidence of bleeding  13. Gout 14. Prior Malaria, Leishmaniasis 15. Pancytopenia - Historically evaluated by hematology  16. Septic arthritis of right  shoulder status postsurgical debridement 12/2021 17. Hyperthyroidism - He started Methimazole for this during a hospitalization 01/2023.   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 He is here with his wife and grandson. He has a family member who is a cardiology fellow at [facility] and was referred through Dr. Luretha Murphy.  [name] has struggled with advanced HF for some time. He was previously enrolled in palliative care after heart failure hospitalization 02/2022 but was discharged from palliative care 05/2022.  He saw Dr. Sullivan Lone  at [facility]  for AHF eval on 03/07/23. Felt not to be a canddiate for advanced therapies due to fraility, 50 pound weight loss. Here for second opinion.   Last echo at [facility] EF 15-20% RV severely reduced.   He says he gets up in the morning and gets his breakfast and tea. Then goes back to bed for a few hours. Minimally active. His wife says any activity is a challenge. Very SOB with any acitivity. No CP or edema. Very poor appetitie. Has lost 60-70 pounds. Has been intolerant of most GDMT due to low BP.  Unable to tolerate digoxin as well. His wife is very concerned that we are missing something with one of his medications or co-morbidities that could help turn him around.   Past Medical History:  Diagnosis Date   Acute pulmonary embolism (HCC) 12/03/2015   Bilateral lower lobe pulmonary emboli 11/2015.   BPH (benign prostatic hyperplasia)    Cataract of right eye    CHF (congestive heart failure) (  HCC)    Frozen shoulder    HTN (hypertension)    Hx of prostatitis 3.20.16   WITH E.COLI   LBBB (left bundle branch block)    Leishmaniasis    [place]   Oral candidiasis    [place]   Right tennis elbow    SOB (shortness of breath)     Current Outpatient Medications  Medication Sig Dispense Refill   apixaban (ELIQUIS) 5 MG TABS tablet Take 1 tablet (5 mg total) by mouth 2 (two) times daily. 60  tablet 5   benzonatate (TESSALON) 100 MG capsule Take 100 mg by mouth 3 (three) times daily. Take 1 to 2 capsules by mouth up to 3 times daily as needed for cough.     Cholecalciferol 25 MCG (1000 UT) TBDP Take 25 mcg by mouth daily. 1,000 units     eplerenone (INSPRA) 25 MG tablet Take 25 mg by mouth daily.     famotidine (PEPCID) 20 MG tablet Take 20 mg by mouth in the morning.     FARXIGA 10 MG TABS tablet Take 10 mg by mouth daily.     finasteride (PROSCAR) 5 MG tablet Take 1 tablet (5 mg total) by mouth daily. 150 tablet 0   Flaxseed, Linseed, (FLAXSEED OIL PO) Take 1 capsule by mouth daily.     GARLIC OIL PO Take 1 capsule by mouth daily.     MAGNESIUM OXIDE PO Take 800 mg by mouth 2 (two) times daily.     methIMAzole (TAPAZOLE PO) Take 15 mg by mouth daily.     nitroGLYCERIN (NITROSTAT) 0.4 MG SL tablet Place 0.4 mg under the tongue every 5 (five) minutes as needed.     rosuvastatin (CRESTOR) 10 MG tablet Take 1 tablet by mouth daily.     tamsulosin (FLOMAX) 0.4 MG CAPS capsule Take 0.4 mg by mouth daily after supper.     torsemide (DEMADEX) 10 MG tablet Take 10 mg by mouth daily as needed.     Travoprost, BAK Free, (TRAVATAN) 0.004 % SOLN ophthalmic solution Place 1 drop into the right eye at bedtime.     losartan (COZAAR) 25 MG tablet Take 1 tablet (25 mg total) by mouth daily. (Patient not taking: Reported on 04/07/2023) 90 tablet 3   metoprolol succinate (TOPROL-XL) 25 MG 24 hr tablet Take 12.5 mg by mouth daily. (Patient not taking: Reported on 04/07/2023)     omeprazole (PRILOSEC) 20 MG capsule Take 20 mg by mouth 2 (two) times daily before a meal. (Patient not taking: Reported on 01/20/2023)     No current facility-administered medications for this encounter.    No Known Allergies    Social History   Socioeconomic History   Marital status: Married    Spouse name: Not on file   Number of children: Not on file   Years of education: Not on file   Highest education level: Not on  file  Occupational History   Not on file  Tobacco Use   Smoking status: Former    Current packs/day: 0.00    Types: Cigarettes    Start date: 01/04/1956    Quit date: 01/04/1991    Years since quitting: 32.2   Smokeless tobacco: Never  Substance and Sexual Activity   Alcohol use: Yes    Alcohol/week: 3.0 - 4.0 standard drinks of alcohol    Types: 3 - 4 Standard drinks or equivalent per week  Comment: ALSO DRINKS BEER   Drug use: No   Sexual activity: Not on file  Other Topics Concern   Not on file  Social History Narrative   Not on file   Social Drivers of Health   Financial Resource Strain: Low Risk  (09/22/2020)   Received from [facility] visits prior to 03/05/2022.   Overall Financial Resource Strain (CARDIA)    Difficulty of Paying Living Expenses: Not hard at all  Food Insecurity: Low Risk  (02/08/2023)   [facility] from [facility]   Hunger Vital Sign    Worried About Running Out of Food in the Last Year: Never true    Ran Out of Food in the Last Year: Never true  Transportation Needs: No Transportation Needs (02/08/2023)   Received from [facility]    In the past 12 months, has lack of reliable transportation kept you from medical appointments, meetings, work or from getting things needed for daily living? : No  Physical Activity: Inactive (09/22/2020)   Received from [facility] visits prior to 03/05/2022.   Exercise Vital Sign    Days of Exercise per Week: 0 days    Minutes of Exercise per Session: 0 min  Stress: No Stress Concern Present (09/22/2020)   Received from [facility] visits prior to 03/05/2022.   [employer] of Occupational Health - Occupational Stress Questionnaire    Feeling of Stress : Not at all  Social Connections: Socially Integrated (09/22/2020)   Received from [facility] visits prior to 03/05/2022.   Social Advertising account executive  [NHANES]    Frequency of Communication with Friends and Family: More than three times a week    Frequency of Social Gatherings with Friends and Family: More than three times a week    Attends Religious Services: 1 to 4 times per year    Active Member of [employer] or Organizations: Yes    Attends Banker Meetings: 1 to 4 times per year    Marital Status: Married  Catering manager Violence: Unknown (02/03/2022)   Received from [facility], therapeutic    Feel physically or emotionally unsafe where currently live: Not on file    Harm by anyone: Not on file    Emotionally Harmed: Not on file      Family History  Problem Relation Age of Onset   Cardiomyopathy Mother    Other Father        PROSTATE DISEASE   Cancer Daughter        BREAST   Diabetes Brother    Heart attack Neg Hx    Hypertension Neg Hx    Stroke Neg Hx     Vitals:   04/07/23 1411  BP: (!) 90/58  Pulse: 85  SpO2: 97%  Weight: 70.7 kg (155 lb 12.8 oz)    PHYSICAL EXAM: General:  Sitting in WC. Elderly frail . No respiratory difficulty HEENT: normal Neck: supple. no JVD.  Cor: Regular rate & rhythm. No rubs, gallops or murmurs. Lungs: clear Abdomen: soft, nontender, nondistended. No hepatosplenomegaly. No bruits or masses. Good bowel sounds. Extremities: no cyanosis, clubbing, rash, edema Neuro: alert & oriented x 3, cranial nerves grossly intact. moves all 4 extremities w/o difficulty. Affect pleasant.  ECG: NSR 76 +IVCD ( ) + LVH No ST-T wave abnormalities. Personally reviewed   ASSESSMENT & PLAN:  1. 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 We discussed the limited options for stage D HF (VAD, Tx, palliative inotropes and palliative care/Hospice) - He is not candidate for VAD/Tx with age and fraility - I offered him repeat RHC cath and trial of pallaitive inotropes (dobutamine) with the understanding that this is not a durable therapy and may even shorten his life in exchange for a period of possible increased QoL. He is very realistic about his situation and is not interested in inotropes and wants to continue to live the rest of his life the best he can - His wife is clearly (and understandably) struggling to accept his current clinic prognosis and remains concerned that we are missing something with his medications or management of his comorbidities. We went through these in detail and I stressed the fact that his HF was the major driving factor behind his progressive clinical decline - I raised the issue of potential ICD deactivation and Professor Giuffre seemed interested in this but I do not think his family would agree at this point. He will f/u with EP in [facility]  2. Apical thrombus  - Continue Eliqius  3.Coronary artery disease  - Stable per [facility] 10/20/20. No current s/s angina  I spent a total of 50 minutes today: 1) reviewing the patient's medical records including previous charts, labs and recent notes from other providers; 2) examining the patient and counseling them on their medical issues/explaining the plan of care; 3) adjusting meds as needed and 4) ordering lab work or other needed tests.     Arvilla Meres, MD  2:33 PM

## 2023-04-07 ENCOUNTER — Ambulatory Visit (HOSPITAL_COMMUNITY)
Admission: RE | Admit: 2023-04-07 | Discharge: 2023-04-07 | Disposition: A | Discharge status: Home / Self Care | Source: Ambulatory Visit | Attending: Internal Medicine | Admitting: Internal Medicine

## 2023-04-07 ENCOUNTER — Encounter (HOSPITAL_COMMUNITY): Payer: Self-pay | Admitting: Internal Medicine

## 2023-04-07 VITALS — BP 90/58 | HR 85 | Wt 155.8 lb

## 2023-04-07 DIAGNOSIS — R9431 Abnormal electrocardiogram [ECG] [EKG]: Secondary | ICD-10-CM | POA: Diagnosis not present

## 2023-04-07 DIAGNOSIS — I2510 Atherosclerotic heart disease of native coronary artery without angina pectoris: Secondary | ICD-10-CM | POA: Diagnosis not present

## 2023-04-07 DIAGNOSIS — I5084 End stage heart failure: Secondary | ICD-10-CM | POA: Diagnosis not present

## 2023-04-07 DIAGNOSIS — I130 Hypertensive heart and chronic kidney disease with heart failure and stage 1 through stage 4 chronic kidney disease, or unspecified chronic kidney disease: Secondary | ICD-10-CM | POA: Insufficient documentation

## 2023-04-07 DIAGNOSIS — I255 Ischemic cardiomyopathy: Secondary | ICD-10-CM | POA: Diagnosis present

## 2023-04-07 DIAGNOSIS — I513 Intracardiac thrombosis, not elsewhere classified: Secondary | ICD-10-CM | POA: Diagnosis not present

## 2023-04-07 DIAGNOSIS — N1831 Chronic kidney disease, stage 3a: Secondary | ICD-10-CM | POA: Diagnosis not present

## 2023-04-07 DIAGNOSIS — I509 Heart failure, unspecified: Secondary | ICD-10-CM | POA: Diagnosis present

## 2023-04-07 DIAGNOSIS — I5022 Chronic systolic (congestive) heart failure: Secondary | ICD-10-CM | POA: Insufficient documentation

## 2023-04-07 DIAGNOSIS — Z87891 Personal history of nicotine dependence: Secondary | ICD-10-CM | POA: Diagnosis not present

## 2023-04-07 DIAGNOSIS — Z95810 Presence of automatic (implantable) cardiac defibrillator: Secondary | ICD-10-CM | POA: Insufficient documentation

## 2023-04-07 DIAGNOSIS — Z86711 Personal history of pulmonary embolism: Secondary | ICD-10-CM | POA: Insufficient documentation

## 2023-04-07 DIAGNOSIS — R0602 Shortness of breath: Secondary | ICD-10-CM | POA: Diagnosis present

## 2023-04-07 DIAGNOSIS — Z7901 Long term (current) use of anticoagulants: Secondary | ICD-10-CM | POA: Insufficient documentation

## 2023-04-07 NOTE — Patient Instructions (Signed)
Great to see you today! Please continue current medications.

## 2023-05-03 ENCOUNTER — Encounter: Payer: Self-pay | Admitting: Internal Medicine

## 2023-05-08 NOTE — Progress Notes (Signed)
 ------------------------------------------------------------------------------- Summary: RE-IMPRESS FOR CAST POST -------------------------------------------------------------------------------  [facility] 05/08/23  Dental procedures in this visit  . [id number] - RE-EVALUATION - POST-OPERATIVE OFFICE VISIT    HEALTH HISTORY ? Vitals:  BP Readings from Last 1 Encounters:  05/08/23 110/73    Pulse:  90  Medical history was reviewed and updated. No contraindication to care.  Medical History[1] Surgical History[2] Social History[3] Family History[4] Medications Ordered Prior to Encounter[5]  Medical Risk Assessment ASA GRADE: ASA 1 - Normal health patient  Subjective Patient reports no change to condition since last visit.   Objective:  Radiographs taken:  of area: Clinical findings: Soft tissue in area to be treated is healthy.  Assessment: Patient presents to clinic for  Delivery cast post and  to finished RCT #14 due to  Cast post not fitiing properly  New impression was made for cast post and crown #14 will finished RCT As well at next visit.SABRA Discussed with patient risks and precautions. Necessary consent were obtained from patient.  Paused to confirm correct patient, correct teeth for treatment, and confirmed diagnosis for treatment.  Consent obtained? Yes  Crown Prep: Initial or replacement? Initial  - Topical anesthetic (benzocaine 20%) was applied  - , Citanest Plain 4% x Carpules: 1 carpule;  - Tooth prepared and adequate occlusal clearance verified - Margin: subgingival modified chamfer - Cord placement: plain   - Final impression captured using: PVS and tray : triple tray - Cord removed - Opposing dentition and bite registration recorded - Stump shade: A-3 - Restoration shade: A-3 - Temporization: Tooth temporized using acrylic and placed with [place] - Temp made by doctor  Plan:  Home care and post-op reviewed with  patient  ESTIMATED DELIVERY DATE:  2 weeks   Additional Notes: We will call back patient when cast post and crown is her.  NV: 3 weeks for delivery cast post and crown finish RCT #14  Treatment Providers Dental Assistant: Madeline Tamea Debby Madelyn, DDS [facility] [address] [place] [id number] [phone]        [1] Past Medical History: Diagnosis Date  . Cardiovascular disease   . History of heart attack   [2] Past Surgical History: Procedure Laterality Date  . CARDIAC SURGERY    . JOINT REPLACEMENT    [3] Social History Tobacco Use  . Smoking status: Never  Substance Use Topics  . Alcohol use: Never  . Drug use: Never  [4] No family history on file. [5] Current Outpatient Medications on File Prior to Visit  Medication Sig Dispense Refill  . amoxicillin (AMOXIL) 875 mg tablet Take 1 tablet by mouth in the morning and at bedtime.    SABRA amoxicillin (AMOXIL) 875 mg tablet take 1 tablet by mouth twice a day until gone    . benzonatate (TESSALON) 100 mg capsule TAKE 1 TO 2 CAPSULES BY MOUTH UP TO 3 TIMES DAILY AS NEEDED FOR COUGH    . Eliquis  5 mg tablet Take 5 mg by mouth in the morning and at bedtime.    . finasteride  (PROSCAR ) 5 mg tablet take 1 tablet (5 mg total) by mouth daily.    SABRA garlic 1,000 mg capsule Take 1,000 mg by mouth 1 (one) time each day.    SABRA GARLIC PO Take 1,000 mg by mouth.    . omega 3-dha-epa-fish oil 100-400-1,000 mg capsule Take 1 tablet by mouth.    . omega-3  fatty acids-fish oil 340-1,000 mg capsule Take 1 capsule by mouth 1 (one) time each day.     No current facility-administered medications on file prior to visit.

## 2023-06-26 NOTE — Progress Notes (Signed)
 [facility]   Name: Stephen Hendricks Date of Birth: 23-Nov-1940 MRN: [id number]   Tumor Board Documentation:   Stephen Hendricks was presented at our Tumor Board on 06/29/2023, which included representatives from Medical Oncology, Radiation Oncology, Surgical Oncology, Pathology, Navigation, Radiology, Pulmonology, and Tumor Registry.   Stephen Hendricks currently presents as new patient with history of the following treatments: .   06/05/2023:  CTA Chest: IMPRESSION: No evidence of pulmonary embolism.   Moderate left pleural effusion, new. Associated left lower lobe compressive atelectasis.   Moderate right pleural effusion, chronic with pleural thickening, decreased. Associated rounded atelectasis in the right middle and lower lobes.   Aortic Atherosclerosis  05/25/2023:  Bone Marrow Biopsy w/ Aspiration:  Results   Chromosome analysis result: Normal   Results   Fluorescence in situ hybridization analysis result: Negative       Probe / Locus Abnormality  Assessed Cut-off (> or =) % Result (%)   Interpretation    BCL6 (3q27)   BCL6 separation   3.1   1.0   Negative    IGH/MYC/CEP8   IGH/MYC fusion   1.5   0   Negative    MYC (8q24)   MYC separation   6.8   0.5   Negative    BCL2 (18q21-q22)   BCL2 separation   2.3   0.5   Negative      Final Diagnosis      BONE MARROW; FLOW CYTOMETRIC ANALYSIS:   No monotypic B-cell or plasma cell population identified.  See comment.   Final Diagnosis  BONE MARROW:  Normocellular bone marrow (30%) with trilineage hematopoiesis, mildly decreased myelopoiesis, and mildly increased plasma cells. See comment  Comment   Flow cytometry analysis did not identify a monotypic or immunophenotypically aberrant plasma cell population in the current sample.      05/16/2023:  PET: IMPRESSION: 1. Hypermetabolic right lower lobe consolidation with  small hypermetabolic right paratracheal and right hilar lymph nodes, findings which may be in keeping with the provided history lymphoma. The possibility of primary bronchogenic carcinoma is another consideration. 2. Moderate to large loculated right pleural effusion with pleural thickening, overall decreased in size from 02/07/2023. Associated compressive atelectasis in the right middle and right lower lobes. 3. Small left pleural effusion. 4. Cholelithiasis. 5. Aortic atherosclerosis (ICD10-I70.0). Coronary artery calcification. 6.  Emphysema  05/02/2023:  Thoracentesis right pleural effusion Final Diagnosis      PLEURAL FLUID; FLOW CYTOMETRIC ANALYSIS:  No monotypic B-cell or immunophenotypically aberrant T-cell population identified. See comment. Pathology Review       Atypical cells present consistent with malignancy.  Correlation with cytology is recommended.      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 The tumor board considered available treatment options and made the following recommendations:  Plan:  Patient to follow up with Dr. Fernand on 07/03/2023.  At that time, the various treatments will be discussed: Chest tube insertion with TPA for pleural effusion Chest tube insertion with biopsy of RLL to be done via chest tube. Initiation of empiric chemotherapy  Clinical Trial Status:  None available   National site-specific guidelines were discussed with respect to the case.   Tumor board is a meeting of clinicians from various specialty areas who evaluate and discuss patients for whom a  multidisciplinary approach is being considered. Final determinations in the plan of care are those of the provider(s). The responsibility for follow up of recommendations given during tumor board is that of the provider.    Today's extended care, comprehensive team conference did not exceed 30 minutes. Stephen Hendricks was not present for the discussion and was not examined.

## 2023-06-26 NOTE — Progress Notes (Signed)
 Patient received IV fluids via PIV. PIV flushed with saline per protocol. Patient tolerated well. Pt educated about infusion and given opportunity to ask questions. Patient verbalized understanding of education. AVS declined. D/c via wheelchair to home.

## 2023-06-26 NOTE — Progress Notes (Signed)
 Out Patient Neurology Note [facility] [address], [address] [place], [place]  [address] [phone] * FAX [phone]   Name:  Stephen Hendricks MRN:  [id number] DOB:  Oct 20, 1940 Age:  83 y.o.  Subjective: He was last seen by neurology 03/30/23 by Greig Pellant, NP with this note:  [name] is a 83 y.o.  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 History is obtained from the patient, patient's wife at bedside, and review of the EMR. Neurology consulted due to the slurred speech.    Patient and his wife report slurred speech for a week now. Patient reports the slurred speech has been intermittent over the last week. Patient reports he and his family noticed it more when he is speaking in [place] and not in Arabic. His wife reports that he complains of his tongue feeling heavy. Patient denies any headache, vision changes, facial droop, numbness, tingling, difficulty swallowing, or any focal weakness. He does have mild left facial droop in which the patient's wife reports this is new for him.    Patient reports he takes Elquis at home. His wife reports that she does his medications at home and he has not missed any doses. She also reports he is on a stain.    CT Head in the ED showed no acute findings.    Labs in the ED show hemoglobin 13.5, Plt count 210, wbc 3.93, glucose 112, creatinine 1.20, BNP 1,115. Blood pressure on arrival was 95/57. Afebrile.      Patient had a CT chest that showed large right sided pleural effusion. Per review of the EMR. his Eliquis  is currently on hold due to anticipated thoracentesis with IR.    Of note, patient's wife reports his ICD was replaced in 2024, but a lead from his previous  ICD was not removed.    Patient is not a thrombolytic candidate due to presenting outside of the time window. Additionally, he is also on Eliquis .   He was somewhat hypotensive when he presented to the emergency department on February 07, 2023 with a blood pressure of 91 systolic.  He could not get an MRI scan because of his ICD.  Brain CT scan showed only chronic small vessel ischemic changes.  He was on Eliquis  at the time he developed his dysarthria.  He also has had some dysphagia for liquids with frequent coughing after he swallows liquids.  He never had a swallow study performed.  His slurred speech has improved.  Flattening of the left nasolabial fold has also improved.  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  PET scan showed apparent neoplastic paratracheal lymph nodes.  He is currently being evaluated by oncology.  He was so hypotensive today with a systolic blood pressure in the 70s that he was given intravenous fluid.  Even after that his blood pressure currently is 89/60.  He had to come in a wheelchair today.  He has significant ambulation issues.  He has a remote fractured right femur.  He did not have any sensation changes, vision changes, strength changes or clear coordination changes in February.   History:   Medical History[1]  Family History[2]  Social History:  reports that he  quit smoking about 33 years ago. His smoking use included cigarettes. He has never used smokeless tobacco. He reports current alcohol use of about 1.0 standard drink of alcohol per week. He reports that he does not use drugs.  Surgical History[3]   Problem List:  Problem List[4]  Current Medications:  Current Medications[5]    Allergies:  is allergic to colchicine.        PHYSICAL EXAM    Vitals:   Vitals:   06/26/23 1526  BP: (!) 89/60  Pulse: 77  Resp: 12    Weight:  Weights (last 90 days)     None        BMI:  There is no height or weight on file to calculate BMI.  GENERAL: No acute distress.   Well nourished and well developed. Appears stated age.  Very pleasant slender elderly African man with thick but generally understandable speech.  Visual fields are full.  Pupils show a little reaction to light and are postsurgical.  Extraocular movements are full.  Facial sensation was intact.  Face strength was symmetric.  Palate and tongue were symmetric.  He can hear a finger no click media in front of either ear.  Shoulder shrug was done well.  Motor exam: He has a frozen right shoulder but his strength is otherwise normal in the upper extremities.  He has 4-5 hip flexor strength and 5 out of 5 dorsi flexion strength.  Deep tendon reflexes are diffusely reduced.  Sensation to light touch is bilaterally intact.  Finger-nose testing is done well.  He is fairly unstable walking and did not appear safe to walk independently but could walk with standby assistance a few steps.    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  He is having significant swallowing issues.  He is complicated and that he has fairly severe cardiomyopathy and severe weight loss likely due to recently diagnosed though poorly characterized lymphoma.  He has significantly limited in his mobility and would be a very good candidate for home rehabilitation therapy.  Specifically it is medically necessary for him to have a swallow evaluation.    Plan and Patient Instructions:   Patient Instructions  Try to drink 1.5 liters of fluid a day. Can use Thick It type fluid to help thicken liquids and prevent choking.  2.   Hold torsemide for today.  3.    Call 911 with any stroke symptoms.  4.   Home physical, speech and  occupational therapy via home health therapy.   Follow up: No follow-ups on file.       [1] Past Medical History: Diagnosis Date  . CHF (congestive heart failure)    (CMD)   . Glaucoma   . Heart disease   . Hypertension   . Prostate enlargement   [2] Family History Problem Relation Name Age of Onset  . Cardiomyopathy Mother    . Breast cancer Daughter    [3] Past Surgical History: Procedure Laterality Date  . CARDIAC DEFIBRILLATOR PLACEMENT     Procedure: CARDIAC DEFIBRILLATOR PLACEMENT  . FEMUR FRACTURE SURGERY Right    Procedure: FEMUR FRACTURE SURGERY  . JOINT REPLACEMENT     Procedure: JOINT REPLACEMENT  . TOTAL KNEE ARTHROPLASTY Left    Procedure: REPLACEMENT  TOTAL KNEE  [4] Patient Active Problem List Diagnosis  . Nasal mucosa dry  . Chronic systolic heart failure (HCC)  . BIV ICD  - MEDTRONIC - VT/VF Tx OFF --------  . Ischemic dilated cardiomyopathy (HCC)  . Essential hypertension  . Pure hypercholesterolemia  . Nonsustained ventricular tachycardia (HCC)  . LV (left ventricular) mural thrombus  . Hypomagnesemia  . Acute pulmonary embolism (HCC)  . Cataracts, bilateral  . PVC (premature ventricular contraction)  . HFrEF (heart failure with reduced ejection fraction) (HCC)  . Coronary artery disease involving native coronary artery of native heart without angina pectoris  . History of pulmonary embolism  . Decreased range of motion of right shoulder  . Stage 3 chronic kidney disease (HCC)  . Vitamin D deficiency  . Panlobular emphysema    (CMD)  . Cerebrovascular disease with cerebral atrophy  . Shortness of breath  [5]  Current Outpatient Medications:  .  apixaban  (Eliquis ) 5 mg tab, Take 1 tablet (5 mg total) by mouth 2 (two) times a day., Disp: 180 tablet, Rfl: 3 .  finasteride  (PROSCAR ) 5 mg tablet, Take 1 tablet (5 mg total) by mouth daily., Disp: 90 tablet, Rfl: 3 .  magnesium oxide 400 mg (241 mg magnesium) tab, Take 2 tablets (800 mg total)  by mouth 2 (two) times a day., Disp: 360 tablet, Rfl: 3 .  methIMAzole (TAPAZOLE) 10 mg tablet, Take 1.5 tablets (15 mg total) by mouth daily., Disp: 135 tablet, Rfl: 1 .  methIMAzole (TAPAZOLE) 5 mg tablet, Take one tablet by mouth daily along with (one) 10 mg tablet for a total dose of 15 mg., Disp: , Rfl:  .  mirtazapine (Remeron) 15 mg tablet, Take 1 tablet (15 mg total) by mouth at bedtime., Disp: 90 tablet, Rfl: 3 .  miscellaneous medical supply misc, 1 Adult blood pressure cuff. Dx. I.10  1 Scale. Dx. I50.9, Disp: 1 each, Rfl: 0 .  omeprazole (PriLOSEC) 20 mg DR capsule, TAKE 1 CAPSULE BY MOUTH EVERY DAY, Disp: 90 capsule, Rfl: 1 .  rosuvastatin (CRESTOR) 10 mg tablet, Take 1 tablet (10 mg total) by mouth daily., Disp: 90 tablet, Rfl: 1 .  tamsulosin  (FLOMAX ) 0.4 mg cap, TAKE 1 CAPSULE BY MOUTH EVERY DAY, Disp: 90 capsule, Rfl: 1 .  torsemide (DEMADEX) 10 mg tablet, TAKE 1 TABLET (10 MG TOTAL) BY MOUTH AS NEEDED (WEIGHT GAIN OF 3 LBS OVERNIGHT OR 5 LBS IN A WEEK, SWELLING OR SHORTNESS OF BREATH)., Disp: 90 tablet, Rfl: 3 .  travoprost (TRAVATAN Z) 0.004 % drop, ADMINISTER 1 DROP INTO EACH EYES NIGHTLY INDICATIONS: WIDE-ANGLE GLAUCOMA., Disp: 15 mL, Rfl: 1 .  cholecalciferol (VITAMIN D3) 1,000 unit (25 mcg) tablet, Take 1,000 Units by mouth Once Daily. (Patient not taking: Reported on 06/26/2023), Disp: , Rfl:  .  fish,bora,flax oils-om3,6,9no1 (Omega 3-6-9) 1,200 mg cap, Take 1 capsule by mouth Once Daily. (Patient not taking: Reported on 06/26/2023), Disp: , Rfl:  .  GARLIC OIL ORAL, Take 5,000 mg by mouth daily. (Patient not taking: Reported on 06/26/2023), Disp: , Rfl:  No current facility-administered medications for this visit.

## 2023-06-26 NOTE — Patient Instructions (Addendum)
 Try to drink 1.5 liters of fluid a day. Can use Thick It type fluid to help thicken liquids and prevent choking.  2.   Hold torsemide for today.  3.    Call 911 with any stroke symptoms.  4.   Home physical, speech and occupational therapy via home health therapy.

## 2023-06-26 NOTE — Progress Notes (Signed)
 Outpatient Oncology Nutrition Supportive Oncology [facility]   Outpatient Oncology Nutrition Note   PATIENT NAME:  Stephen Hendricks DATE:  06/26/2023  TIME:  11:59 AM  Note Type: Follow-up Referral Reason:    Encounter Type: In-person  Patient Overview: Stephen Hendricks is a 83 y.o. with malignancy, workup ongoing.   Interventions:    Nutrition Intervention: General healthful diet, Medical food supplements, Modify composition of meals/snacks, Nutrition education  Intervention Details:  Education - ways to increase calories and protein  Increase frequency of small meals and snacks    Liquid nutrition supplements - utilize as tolerated, provided samples  Continue to monitor sodium and fluid intake for HF   Nutrition Education:  Yes Handouts Provided (n/a): High Calorie High Protein Diet High Calorie Food List and Snack Ideas  Learner: Patient and Family Preferred Learning Methods: Discussion Method of Teaching: Verbal instructions Learner Response: Demonstrates acceptable knowledge of topic/instructions  Nutrition Intervention Barriers: None  Nutrition Assessment Summary:    6/23 - Met with patient and wife this morning at follow up. C/o anorexia, early satiety, constipation (continues with Miralax), weight loss, dry mouth, fatigue. Intake continues to be inadequate.  Patient tried liquid supplements provided at last visit and liked the Boost Breeze the best. Wife requested addiitonal samples today. RD does not have any Boost Breeze at this time but provided additional Ensure Complete and Boost VHC. Provided xylimelt samples for dry mouth. Continued to encourage more frequent small meals and snacks.   5/6 -  Met with patient and family at medical oncology visit this afternoon. C/o anorexia, nausea (meds prescribed today), constipation (taking miralax), weight loss, dry mouth, fatigue. Provided education on the importance of  nutrition and weight maintenance during any treatments. Reviewed current intake which is inadequate. Provided education on the importance of nutrition and weight maintenance during treatments. Discussed increasing meal and snack frequency. Discussed liquid nutrition supplements and provided samples for patient to try.   Assessment:   PAST MEDICAL HISTORY: Past Medical History:  Diagnosis Date  . CHF (congestive heart failure)    (CMD)   . Glaucoma   . Heart disease   . Hypertension   . Prostate enlargement      PAST SURGICAL HISTORY: Past Surgical History:  Procedure Laterality Date  . CARDIAC DEFIBRILLATOR PLACEMENT     Procedure: CARDIAC DEFIBRILLATOR PLACEMENT  . FEMUR FRACTURE SURGERY Right    Procedure: FEMUR FRACTURE SURGERY  . JOINT REPLACEMENT     Procedure: JOINT REPLACEMENT  . TOTAL KNEE ARTHROPLASTY Left    Procedure: REPLACEMENT TOTAL KNEE     Home Medications:  Prior to Admission medications  Medication  apixaban  (Eliquis ) 5 mg tab  cholecalciferol (VITAMIN D3) 1,000 unit (25 mcg) tablet  finasteride  (PROSCAR ) 5 mg tablet  fish,bora,flax oils-om3,6,9no1 (Omega 3-6-9) 1,200 mg cap  GARLIC OIL ORAL  magnesium oxide 400 mg (241 mg magnesium) tab  methIMAzole (TAPAZOLE) 10 mg tablet  methIMAzole (TAPAZOLE) 5 mg tablet  mirtazapine (Remeron) 15 mg tablet  miscellaneous medical supply misc  omeprazole (PriLOSEC) 20 mg DR capsule  rosuvastatin (CRESTOR) 10 mg tablet  tamsulosin  (FLOMAX ) 0.4 mg cap  torsemide (DEMADEX) 10 mg tablet  travoprost (TRAVATAN Z) 0.004 % drop  cyanocobalamin (Vitamin B-12) 500 mcg tablet  nitroglycerin (NITROSTAT) 0.4 mg SL tablet  ondansetron  (ZOFRAN -ODT) 8 mg disintegrating tablet  prochlorperazine (COMPAZINE) 10 mg tablet  vitamins A,C,E-zinc-copper (I-VITE PROTECT) 2,148 mcg-113 mg-45 mg-17.4mg  tab    Nutrition Pertinent Herbal  Medications/Supplements: None reported  Relevant Labs: Nutrition-related labs reviewed from  06/26/23  Nutrition Focused Physical Exam: NFPE deferred; will attempt at future visit if able/applicable      Symptoms: Nausea Constipation Anorexia Weight Loss Dry Mouth Fatigue  Anthropometrics: Wt Readings from Last 1 Encounters:  06/26/23 63.9 kg (140 lb 14.4 oz)   Ht Readings from Last 1 Encounters:  06/26/23 1.88 m (6' 2)   BMI Readings from Last 1 Encounters:  06/26/23 18.09 kg/m   Weight Classification: Underweight BMI: 18.09  Weight History: 12% weight loss within the past 7 weeks (severe) Stated UBW: 210 IBW: 190 (if applicable)  Wt Readings from Last 20 Encounters:  06/26/23 63.9 kg (140 lb 14.4 oz)  06/06/23 67.7 kg (149 lb 3.2 oz)  05/25/23 70.8 kg (156 lb)  05/15/23 70.8 kg (156 lb)  05/09/23 72.3 kg (159 lb 8 oz)  04/28/23 70.3 kg (155 lb)  04/27/23 71.9 kg (158 lb 8 oz)  03/07/23 71.3 kg (157 lb 1.6 oz)  02/22/23 73.5 kg (162 lb)  02/16/23 73.9 kg (163 lb)  02/10/23 70.4 kg (155 lb 3.3 oz)  02/01/23 75.8 kg (167 lb)  01/05/23 78.1 kg (172 lb 3.2 oz)  11/30/22 82.1 kg (181 lb)  09/20/22 82.2 kg (181 lb 4.8 oz)  09/13/22 84.4 kg (186 lb)  11/17/21 89.4 kg (197 lb 3.2 oz)  11/12/21 86.6 kg (191 lb)  10/29/21 96.2 kg (212 lb)  10/25/21 96.7 kg (213 lb 3.2 oz)   5/6 - Patient reported 60# weight loss within the past 6 months  Records indicate 27# weight loss within the past 8 months (14.5%)   Stated Nutrition Intake History:    Regular diet, decreased appetite   24 hour recall:  Stew Lentil soup Both very small portions   1500 mL FR  2g sodium diet   Pt is consuming <75% of estimated energy needs daily  Religious, cultural, ethnic or personal food preferences: none noted  Food Allergies: no known food allergies    Estimated Needs:   Nutrition Needs Based On: Actual weight  Weight Used: 63.9 kg (140 lb 14 oz)  Calories (kcal/day): 8082-7762  Calories based on: 30-35 kcal/kg  Protein (g/day): 77-96  Protein based on:  1.2-1.5g/kg  Fluid (mL/day):  , 1-1.1 mL/kcal  Nutrition Diagnosis:   Nutrition Diagnosis: Inadequate oral intake Problem Related To: Decreased appetite, Increased nutrient demand Problem as Evidenced By: 24 hour recall, ongoing weight loss.         Malnutrition Diagnosis Is Malnutrition Present?: Yes Malnutrition Classification: Severe protein-calorie malnutrition Malnutrition Etiology: Chronic illness Problem Related To: Chronic illness, Decreased appetite Weight Loss: >7.5% weight loss - 3 months Energy Intake: < or equal to 75% of est energy needs for > or equal to 1 month  Goals, Monitoring & Evaluation:   Nutrition Monitoring and Evaluation: Adequate PO, Minimize nutrition impact symptoms, Other (comment) (Maintain wt)  Nutrition Goal Status: Not met  Follow-up Information:    RDN will follow during treatment per clinical judgement RDN contact information provided and patient encouraged to reach out with nutrition questions or concerns.              Rocky Specking, RD  06/26/2023 11:59 AM

## 2023-06-27 NOTE — Progress Notes (Signed)
 Sample was provided to the patient.  Medication specifics are noted: Brextri Quantity:  2  Lot number:  [id number]  Expiration Date:  10/27  Was patient education provided? Yes  Was the sample labeled?  Yes  Is the sample medication listed in the active medication list? Yes

## 2023-06-27 NOTE — Telephone Encounter (Signed)
 Wife calling to speak with Delon

## 2023-06-27 NOTE — Telephone Encounter (Signed)
 Wife sent a portal message.  Replied to her there.

## 2023-06-27 NOTE — Progress Notes (Signed)
 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 and is under cardiology care at [facility] - Recently hospitalized for significant right pleural effusion requiring drainage - Reports no daily dyspnea but noted mouth breathing when supine starting 2 days ago - Occasional productive cough without chest congestion - No recent weight changes or edema - Unaware of pleural effusion cause and no pulmonologist consultation during hospitalization - No chest radiograph since last hospitalization - No chest tightness or wheezing but experiences dyspnea during ambulation, resolving upon sitting - No history of emphysema or inhaler use - Currently on Eliquis  - History of heavy smoking, quit 2 years ago - Concern for potential emphysema  Supplemental information: None  SOCIAL HISTORY History of heavy smoking, quit 2 years ago.  MEDICATIONS Eliquis    The patient presents for follow-up on pleural effusion and significant weight loss.  Pleural Effusion - Reports dyspnea exacerbated by physical activity, temporarily relieved by fluid drainage. - Seeks information on lung tissue biopsy and fasting requirements for thoracentesis.  Weight Loss - Significant weight loss (>70 lbs in the past year, current weight <130 lbs, recent loss >10 lbs).  Supplemental information: He did not respond well to Trelegy and  requests alternative inhaler options.   Record review: [facility] advanced heart failure clinic note reviewed where the patient was seen in the context of advanced systolic heart failure.  Noted prior CRT-D device placed (originally 2019, replaced 05/2022), noted weight loss.  History of pulmonary embolism on Eliquis . 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  Underwent ultrasound-guided thoracentesis of the right large pleural effusion 2 L were removed.  Medications on hospital discharge included torsemide 10 mg daily, apixaban , eplerenone,   Past Medical History:  Diagnosis Date  . CHF (congestive heart failure)    (CMD)   . Glaucoma   . Heart disease   . Hypertension   . Prostate enlargement    Past Surgical History:  Procedure Laterality Date  . CARDIAC DEFIBRILLATOR PLACEMENT     Procedure: CARDIAC DEFIBRILLATOR PLACEMENT  . FEMUR FRACTURE SURGERY Right    Procedure: FEMUR FRACTURE SURGERY  . JOINT REPLACEMENT     Procedure: JOINT REPLACEMENT  . TOTAL KNEE ARTHROPLASTY Left    Procedure: REPLACEMENT TOTAL KNEE    Social Drivers of Health   Living Situation: Low Risk  (06/09/2023)   Living Situation   . What is your living situation today?: I have a steady place to live   . Think about the place you live. Do you have problems with any of the following? Choose all that apply:: None/None on this list  Food Insecurity: Low Risk  (06/09/2023)   Food vital sign   . Within the past 12 months, you worried  that your food would run out before you got money to buy more: Never true   . Within the past 12 months, the food you bought just didn't last and you didn't have money to get more: Never true  Transportation Needs: No  Transportation Needs (06/09/2023)   Transportation   . In the past 12 months, has lack of reliable transportation kept you from medical appointments, meetings, work or from getting things needed for daily living? : No  Utilities: Low Risk  (06/09/2023)   Utilities   . In the past 12 months has the electric, gas, oil, or water company threatened to shut off services in your home? : No  Alcohol Screening: Not At Risk (02/07/2023)   Alcohol   . Audit C Alcohol risk score: 2  Tobacco Use: Medium Risk (06/27/2023)   Patient History   . Smoking Tobacco Use: Former   . Smokeless Tobacco Use: Never   . Passive Exposure: Not on file  Depression: Not at risk (06/27/2023)   PHQ-2   . PHQ-2 Score: 2  Recent Concern: Depression - Moderate depression (06/27/2023)   PHQ-9   . PHQ-9 Score: 13   Family History  Problem Relation Name Age of Onset  . Cardiomyopathy Mother    . Breast cancer Daughter     Current Outpatient Medications  Medication Instructions  . apixaban  (ELIQUIS ) 5 mg, oral, 2 times daily  . budesonide-glycopyr-formoterol (Breztri Aerosphere) 160-9-4.8 mcg/actuation inhaler 2 puffs, inhalation, 2 times daily  . cholecalciferol (VITAMIN D3) 1,000 Units, Daily  . finasteride  (PROSCAR ) 5 mg, oral, Daily  . fish,bora,flax oils-om3,6,9no1 (Omega 3-6-9) 1,200 mg cap 1 capsule, Daily  . GARLIC OIL ORAL 5,000 mg, Daily  . magnesium oxide 800 mg, oral, 2 times daily  . methIMAzole (TAPAZOLE) 5 mg tablet Take one tablet by mouth daily along with (one) 10 mg tablet for a total dose of 15 mg.  . methIMAzole (TAPAZOLE) 15 mg, oral, Daily  . midodrine (PROAMATINE) 5 mg, oral, 3 times daily  . mirtazapine (REMERON) 15 mg, oral, At bedtime  . miscellaneous medical supply misc 1 Adult blood pressure cuff. Dx. I.10  1 Scale. Dx. I50.9  . omeprazole (PRILOSEC) 20 mg, oral, Daily  . rosuvastatin (CRESTOR) 10 mg, oral, Daily  . tamsulosin  (FLOMAX ) 0.4 mg, oral, Daily  . torsemide (DEMADEX) 10 mg tablet  TAKE 1 TABLET (10 MG TOTAL) BY MOUTH AS NEEDED (WEIGHT GAIN OF 3 LBS OVERNIGHT OR 5 LBS IN A WEEK, SWELLING OR SHORTNESS OF BREATH).  SABRA travoprost (TRAVATAN Z) 0.004 % drop 1 drop, Both Eyes, Nightly   Allergies  Allergen Reactions  . Colchicine Nausea And Vomiting     Exam: Vitals:   06/27/23 1615  BP: 97/62  Pulse: 81  Resp: 18  Temp: 97.8 F (36.6 C)  SpO2: 93%    Gen: chronically ill appearing, no acute distress HEENT: NCAT OP clear EOMi PULM: Diminished breath sounds right lung to 1/2 to 2/3, clear on left, normal effort CV: RRR, no mgr GI: BS+, soft, nontender MSK: normal bulk and tone Neuro: awake and alert, MAEW    STUDIES Imaging: 2017 CT angiogram chest showed acute bilateral lower lobe segmental and subsegmental nonocclusive pulmonary emboli no right heart strain, bibasilar atelectasis, moderate emphysema, possible superimposed right lower lobe infarct February 2025 right ultrasound-guided thoracentesis 2 L of clear, amber-colored fluid removed.  Imaging felt that this was a large partially loculated pleural effusion February 2025 CT chest showed a large right-sided pleural effusion  with consolidation and probable compressive atelectasis of the right middle and lower lobes, emphysema, age-indeterminate moderate superior endplate compression fractures, gallstones    April 26, 2013 point-of-care ultrasound performed by me: Moderate size right-sided pleural effusion with mobile diaphragm, sliding lung noted about two thirds of the way up right hemithorax.  Labs:   Path: 04/2023 left inferior thyroid nodule FNA> benign follicular nodule  Micro:   PFT: October 2022 FEV1 to [facility] ratio 80%, FVC 4.09 L (98.9%), total lung capacity 6.32 L (80.3%), DLCO 16.9 (61.5%)  Echo: February 2025 TTE LVEF 20%, mild eccentric left ventricular hypertrophy, apical trabeculation with small thrombus reduced compared to prior, elevated left atrial pressure, left atrium mild to  moderately dilated, mild aortic regurgitation, mild mitral regurgitation, mild tricuspid regurgitation, RVSP 37 RV size and function normal  Heart Cath: R[facility] on 01/12/22 showing increased PCWP (20), PA 31, RA 11, low-normal CI (Fick 2.7, TD 2.1)  [facility] on 11/05/21 noted reduced CO (TD 3.5 L/min)   Sleep study:   Ambulatory oximetry:  Impression: Diagnoses and all orders for this visit: Pleural effusion on right -     XR Chest 2 Views; Future Lymphoma of extranodal site excluding spleen and other solid organs, unspecified lymphoma type    (CMD) -     XR Chest 2 Views; Future COPD with acute exacerbation    (CMD) Physical deconditioning Moderate protein-calorie malnutrition (CMD) Other orders -     budesonide-glycopyr-formoterol (Breztri Aerosphere) 160-9-4.8 mcg/actuation inhaler; Inhale 2 puffs 2 (two) times a day.     Mr. Margart Snider presents today with ongoing weight loss and shortness of breath in the presence of a moderate to large side recurrent right-sided pleural effusion with cytologic features suggestive of underlying lymphoma.  Unfortunately up until this time we have been unable to obtain adequate tissue to define the type of lymphoma to help guide therapy.  His course is complicated by physical deconditioning, protein calorie malnutrition, and advanced systolic heart failure.  We are considering a tissue biopsy to better define this illness.  Potential targets would include mediastinal lymph nodes which are mildly FDG avid on PET/CT, perhaps also the area of consolidation in the right lower lobe.  While this area has imaging characteristics of rounded atelectasis it has increased FDG activity and may represent a focus of neoplasm.  I discussed the risks of biopsy with his family today.  I explained that he would be considered high risk because of his advanced cardiac and pulmonary comorbid illnesses.  Despite this he understands the risks and he is willing to proceed with a biopsy  under general anesthesia.  I think to mitigate risks the best option would be to have as much of the pleural fluid drained prior to a procedure.  This may necessitate a indwelling pleural drainage catheter.  He is to undergo bilateral thoracenteses this week, I am going to order a chest x-ray after this procedure to determine if his lung is capable of reexpanding and then will coordinate placement of a Pleurx catheter with interventional radiology.  Further, I will discuss possibility of biopsy with our advanced bronchoscopy team.  Plan: Assessment & Plan 1. Centrilobular emphysema with dyspnea: Chronic. - Transition from Trelegy to Breztri, 2 puffs BID. Sample provided.  2. Right pleural effusion, suspicious for lymphoma: Acute. - Plan for bilateral thoracentesis this week, followed by chest x-ray (06/29/2023, 9 AM) to assess lung re-expansion. - Discuss PleurX catheter placement. - Discuss biopsy.  3. Concern for lymphoma: Pleural fluid  highly suggestive. - Consult advanced interventional pulmonary and diagnostic colleagues for biopsy.  4. Mediastinal adenopathy, PET positive area of rounded atelectasis in right lower lobe. - Consult advanced interventional pulmonary and diagnostic colleagues for biopsy.  Follow-up - Chest x-ray on 06/29/2023 at 9 AM after thoracentesis.  > 50% of this 40 min visit spent face to face  Current Outpatient Medications  Medication Instructions  . apixaban  (ELIQUIS ) 5 mg, oral, 2 times daily  . budesonide-glycopyr-formoterol (Breztri Aerosphere) 160-9-4.8 mcg/actuation inhaler 2 puffs, inhalation, 2 times daily  . cholecalciferol (VITAMIN D3) 1,000 Units, Daily  . finasteride  (PROSCAR ) 5 mg, oral, Daily  . fish,bora,flax oils-om3,6,9no1 (Omega 3-6-9) 1,200 mg cap 1 capsule, Daily  . GARLIC OIL ORAL 5,000 mg, Daily  . magnesium oxide 800 mg, oral, 2 times daily  . methIMAzole (TAPAZOLE) 5 mg tablet Take one tablet by mouth daily along with (one) 10 mg  tablet for a total dose of 15 mg.  . methIMAzole (TAPAZOLE) 15 mg, oral, Daily  . midodrine (PROAMATINE) 5 mg, oral, 3 times daily  . mirtazapine (REMERON) 15 mg, oral, At bedtime  . miscellaneous medical supply misc 1 Adult blood pressure cuff. Dx. I.10  1 Scale. Dx. I50.9  . omeprazole (PRILOSEC) 20 mg, oral, Daily  . rosuvastatin (CRESTOR) 10 mg, oral, Daily  . tamsulosin  (FLOMAX ) 0.4 mg, oral, Daily  . torsemide (DEMADEX) 10 mg tablet TAKE 1 TABLET (10 MG TOTAL) BY MOUTH AS NEEDED (WEIGHT GAIN OF 3 LBS OVERNIGHT OR 5 LBS IN A WEEK, SWELLING OR SHORTNESS OF BREATH).  SABRA travoprost (TRAVATAN Z) 0.004 % drop 1 drop, Both Eyes, Nightly   Immunization History  Administered Date(s) Administered  . Influenza, High-dose Seasonal, Quadrivalent, Preservative Free 09/28/2021  . Influenza, Injectable, Quadrivalent, Preservative Free 10/14/2015  . Influenza, high-dose, trivalent, PF 09/28/2021, 09/13/2022  . Pneumococcal Conjugate 13-Valent 10/14/2015  . Pneumococcal Conjugate Vaccine 20-Valent (PREVNAR-20) 6 wks+ 08/16/2021    Thresa Lennert MD [facility] Pulmonary and Critical Care  Mobile [phone] Office: [phone]

## 2023-06-27 NOTE — Patient Instructions (Signed)
 Diagnosis: Centrilobular emphysema with dyspnea Right pleural effusion, suspicious for lymphoma Mediastinal adenopathy   Treatment Plan: Transition from Trelegy to Breztri inhaler, 2 puffs twice daily. A sample was provided. Plan for bilateral thoracentesis this week. Chest x-ray scheduled for June 29, 2023, at 9 AM to assess lung re-expansion. Discuss PleurX catheter placement. Consult advanced interventional pulmonary colleagues for biopsy.   Follow-Up Instructions: Attend the thoracentesis procedure this week. Follow up with a chest x-ray on June 29, 2023, at 9 AM after thoracentesis. Await further instructions regarding the biopsy and potential PleurX catheter placement

## 2023-06-28 NOTE — Procedures (Addendum)
  Interventional Radiology Procedure Note  Risks and benefits of thoracentesis were discussed with the patient including, but not limited to bleeding, infection, pneumothorax, and that fact that all the fluid may not be removed during today's procedure.  All of the patient's questions were answered, patient is agreeable to proceed. Consent signed and in chart.  A timeout was performed with all members of the team prior to start of the procedure. Correct patient and correct procedure was confirmed. Allergies were reviewed.   PROCEDURE SUMMARY:  Successful US  guided left thoracentesis. Yielded 1.1 L of clear, dark red fluid. Pt tolerated procedure well. No immediate complications. EBL = trace  Specimen wasd sent for labs.  Chest X-ray reveals no post procedure pneumothorax.    Please see full dictation in imaging section of Epic for procedure details.   Lavanda Franchot Jurist, PA-C

## 2023-06-29 NOTE — Patient Instructions (Signed)
 1. Lymphoma. 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 - Hematologist is investigating the specific type through PET scan and bone marrow biopsy. - Advised to continue follow-ups with the hematologist to determine the exact type and appropriate treatment plan. - Awaiting results to decide on further treatment options.  2. Chronic Obstructive Pulmonary Disease (COPD). - History of COPD with recent thoracentesis providing significant relief. - Symptoms are being monitored, and follow-up with pulmonologist is recommended. - Physical exam findings indicate improved breathing post-thoracentesis. - Advised to continue monitoring symptoms and follow up with pulmonologist as needed.  3. Congestive Heart Failure. - History of congestive heart failure contributing to pleural effusion. - Current heart failure medications include Farxiga and torsemide, although torsemide was recently stopped due to low blood pressure. - Advised to continue heart failure medications and follow up with cardiologist. - Monitoring for symptoms of heart failure and adjusting medications as necessary.  4. Pleural Effusion. - Chronic issue with pleural effusion; recent fluid withdrawal from the left side. - Right side could not be drained due to multiple smaller pockets. - Thoracentesis provided significant relief, improving breathing. - Advised to follow up with pulmonologist for further management and potential additional procedures.  5. Loss of Appetite. - Significant weight loss and loss of appetite noted. - Currently taking mirtazapine (Remeron) to stimulate appetite. - Advised to try consuming Ensure blended with ice cream to make it more palatable and increase protein intake. - Encouraged to eat small amounts frequently throughout the day to improve nutritional status.  6. Deconditioning - discussed PT -patient currently is hesitant to proceed will leave this open - if PT needed/wanted in the  home I am happy to order this service. Briefly discussed a Palliative nurse coming to evaluate him monthly but they are not interested at this time.

## 2023-06-29 NOTE — Progress Notes (Signed)
 Subjective Patient ID: Stephen Hendricks is a 83 y.o. male.  Chief Complaint  Patient presents with  . other    Patient comes in today to discuss recent hospital visit.   History of Present Illness The patient is an 83 year old male who presents to discuss his recent hospital admission. He was hospitalized on 06/05/2023 for shortness of breath and chest pain. He has chronic issues with pleural effusion, COPD, and congestive heart failure. 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 He is accompanied by his wife.  The patient's wife reports that he was discharged from the hospital on 06/10/2023, despite recommendations for a longer stay. The lymphoma diagnosis was initially suspected due to unexplained weight loss. A follow-up with the hematologist last week revealed that the type of lymphoma is still under investigation, necessitating further tests such as a PET scan and bone marrow scan. He has been referred to a primary care physician for additional investigations. A CT scan was performed today, and a thoracentesis procedure was scheduled for this week. However, the procedure could not be completed on the right side due to the presence of several smaller pockets. A successful thoracentesis was performed on the left side yesterday, yielding a significant amount of fluid and improving his breathing. They are unsure if they need to do both the chest x ray and CT scan.  He has been experiencing difficulty with exercise and has expressed interest in home-based physical therapy. His next appointment is with a hematologist oncologist. He has been prescribed Remeron to stimulate his appetite. He has discontinued most of his medications due to low blood pressure, which causes dizziness and fainting. He is currently taking Eliquis , finasteride , methimazole, and Remeron. He has been advised to stop taking torsemide due to its drying effect and his  low blood pressure. He has been experiencing a violent cough, which he believes is unrelated to his chest condition. He has been trying to increase his water intake.    Medical History[1] Family History[2] Social History[3] Surgical History[4] Current Medications[5] Allergies[6] Patient Active Problem List   Diagnosis Date Noted  . Shortness of breath 06/06/2023  . Panlobular emphysema    (CMD) 02/08/2023  . Cerebrovascular disease with cerebral atrophy 02/08/2023  . Stage 3 chronic kidney disease (HCC) 11/17/2021  . Vitamin D deficiency 11/17/2021  . Decreased range of motion of right shoulder   . Coronary artery disease involving native coronary artery of native heart without angina pectoris 07/27/2021  . History of pulmonary embolism 07/27/2021  . HFrEF (heart failure with reduced ejection fraction) (HCC) 10/20/2020  . PVC (premature ventricular contraction) 10/12/2020  . LV (left ventricular) mural thrombus 07/17/2020  . Hypomagnesemia 07/17/2020  . 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 Essential hypertension 06/16/2020  . Pure hypercholesterolemia 06/16/2020  . Nonsustained ventricular tachycardia (HCC) 06/16/2020  . Chronic systolic heart failure (HCC) 04/27/2020  . BIV ICD  - [employer] - VT/VF Tx OFF -------- 04/27/2020  . Acute pulmonary embolism (HCC) 12/03/2015  . Nasal mucosa dry 08/25/2015  . Cataracts, bilateral 08/05/2015    The following information was reviewed by members of the visit team:  Allergies  Meds      HPI  Review of Systems  Objective  Vitals:   06/29/23 1426  BP: (!) 80/50  Pulse: 86  Resp: 16  Temp: 98 F (36.7 C)  TempSrc: Oral  SpO2: 97%  Weight: 63.3 kg (139 lb 9.6 oz)  Physical Exam Vitals and nursing note reviewed.  Constitutional:      General: He is not in acute distress.    Appearance: He is underweight.   Neurological:     Mental Status: He is alert.     Comments: Patient needs to be in a wheel  chair   Psychiatric:        Mood and Affect: Mood normal.        Behavior: Behavior normal.   I have personally spent 46 minutes involved in face-to-face and non-face-to-face activities for this patient on the day of the visit.  Professional time spent includes the following activities, in addition to those noted in the documentation:   - preparing to see the patient (e.g., review of recent and/or remote lab/imaging/study results, provider notes, and patient messages/phone calls available in current EMR, CareEverywhere, and scanned records) -obtaining and/or reviewing separately obtained history either through past provider notes, patient phone calls, and/or patient's family member(s)/caregiver(s) -performing a medically appropriate examination and/or evaluation -counseling and educating the patient/family/caregiver -ordering medications, tests, or procedures -documenting clinical information in the electronic or other health record -reviewing most up to date studies or expert consensus guidelines for screening/diagnosing/treating pertinent conditions/symptoms -independently interpreting results (not separately reported) and communicating results to the patient/family/caregiver -care coordination (not separately reported) -referring and communicating with other health care professionals (when not separately reported)      Assessment/Plan Diagnoses and all orders for this visit:  Chronic systolic heart failure    (CMD)  Lymphoma, unspecified body region, unspecified lymphoma type    (CMD)  Physical deconditioning  Decrease in appetite  Other orders -     midodrine (PROAMATINE) 5 mg tablet; Take 2 tablets (10 mg total) by mouth daily.   Problem List Items Addressed This Visit     Chronic systolic heart failure (HCC) - Primary   Relevant Medications   midodrine (PROAMATINE) 5 mg tablet   Other Visit Diagnoses       Lymphoma, unspecified body region, unspecified lymphoma type     (CMD)         Physical deconditioning         Decrease in appetite            Assessment & Plan 1. Lymphoma. 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 - Hematologist is investigating the specific type through PET scan and bone marrow biopsy. - Advised to continue follow-ups with the hematologist to determine the exact type and appropriate treatment plan. - Awaiting results to decide on further treatment options.  2. Chronic Obstructive Pulmonary Disease (COPD). - History of COPD with recent thoracentesis providing significant relief. - Symptoms are being monitored, and follow-up with pulmonologist is recommended. - Physical exam findings indicate improved breathing post-thoracentesis. - Advised to continue monitoring symptoms and follow up with pulmonologist as needed.  3. Congestive Heart Failure. - History of congestive heart failure contributing to pleural effusion. - Current heart failure medications include Farxiga and torsemide, although torsemide was recently stopped due to low blood pressure. - Advised to continue heart failure medications and follow up with cardiologist. - Monitoring for symptoms of heart failure and adjusting medications as necessary.  4. Pleural Effusion. - Chronic issue with pleural effusion; recent fluid withdrawal from the left side. - Right side could not be drained due to multiple smaller pockets. - Thoracentesis provided significant relief, improving breathing. - Advised to follow up with pulmonologist for further management and potential additional procedures.  5. Loss of  Appetite. - Significant weight loss and loss of appetite noted. - Currently taking mirtazapine (Remeron) to stimulate appetite. - Advised to try consuming Ensure blended with ice cream to make it more palatable and increase protein intake. - Encouraged to eat small amounts frequently throughout the day to improve nutritional status.  6. Deconditioning -  discussed PT -patient currently is hesitant to proceed will leave this open - if PT needed/wanted in the home I am happy to order this service. Briefly discussed a Palliative nurse coming to evaluate him monthly but they are not interested at this time.       Electronically signed: Kristen Diane Kaplan, PA-C 06/29/2023  2:43 PM        [1] Past Medical History: Diagnosis Date  . CHF (congestive heart failure)    (CMD)   . Glaucoma   . Heart disease   . Hypertension   . Prostate enlargement   [2] Family History Problem Relation Name Age of Onset  . Cardiomyopathy Mother    . Breast cancer Daughter    [3] Social History Socioeconomic History  . Marital status: Married  Tobacco Use  . Smoking status: Former    Current packs/day: 0.00    Types: Cigarettes    Quit date: 01/03/1990    Years since quitting: 33.5  . Smokeless tobacco: Never  Substance and Sexual Activity  . Alcohol use: Yes    Alcohol/week: 1.0 standard drink of alcohol  . Drug use: Never   Social Drivers of Health   Food Insecurity: Low Risk  (06/09/2023)   Food vital sign   . Within the past 12 months, you worried that your food would run out before you got money to buy more: Never true   . Within the past 12 months, the food you bought just didn't last and you didn't have money to get more: Never true  Transportation Needs: No Transportation Needs (06/09/2023)   Transportation   . In the past 12 months, has lack of reliable transportation kept you from medical appointments, meetings, work or from getting things needed for daily living? : No  Safety: Low Risk  (06/26/2023)   Safety   . How often does anyone, including family and friends, physically hurt you?: Never   . How often does anyone, including family and friends, insult or talk down to you?: Never   . How often does anyone, including family and friends, threaten you with harm?: Never   . How often does anyone, including family and friends, scream or  curse at you?: Never  Living Situation: Low Risk  (06/09/2023)   Living Situation   . What is your living situation today?: I have a steady place to live   . Think about the place you live. Do you have problems with any of the following? Choose all that apply:: None/None on this list  [4] Past Surgical History: Procedure Laterality Date  . CARDIAC DEFIBRILLATOR PLACEMENT     Procedure: CARDIAC DEFIBRILLATOR PLACEMENT  . FEMUR FRACTURE SURGERY Right    Procedure: FEMUR FRACTURE SURGERY  . JOINT REPLACEMENT     Procedure: JOINT REPLACEMENT  . TOTAL KNEE ARTHROPLASTY Left    Procedure: REPLACEMENT TOTAL KNEE  [5]  Current Outpatient Medications:  .  apixaban  (Eliquis ) 5 mg tab, Take 1 tablet (5 mg total) by mouth 2 (two) times a day., Disp: 180 tablet, Rfl: 3 .  finasteride  (PROSCAR ) 5 mg tablet, Take 1 tablet (5 mg total) by mouth daily., Disp: 90 tablet,  Rfl: 3 .  methIMAzole (TAPAZOLE) 10 mg tablet, Take 1.5 tablets (15 mg total) by mouth daily., Disp: 135 tablet, Rfl: 1 .  methIMAzole (TAPAZOLE) 5 mg tablet, Take one tablet by mouth daily along with (one) 10 mg tablet for a total dose of 15 mg., Disp: , Rfl:  .  midodrine (PROAMATINE) 5 mg tablet, Take 2 tablets (10 mg total) by mouth daily., Disp: 90 tablet, Rfl: 2 .  mirtazapine (Remeron) 15 mg tablet, Take 1 tablet (15 mg total) by mouth at bedtime., Disp: 90 tablet, Rfl: 3 .  omeprazole (PriLOSEC) 20 mg DR capsule, TAKE 1 CAPSULE BY MOUTH EVERY DAY, Disp: 90 capsule, Rfl: 1 .  rosuvastatin (CRESTOR) 10 mg tablet, Take 1 tablet (10 mg total) by mouth daily., Disp: 90 tablet, Rfl: 1 .  tamsulosin  (FLOMAX ) 0.4 mg cap, TAKE 1 CAPSULE BY MOUTH EVERY DAY, Disp: 90 capsule, Rfl: 1 .  travoprost (TRAVATAN Z) 0.004 % drop, ADMINISTER 1 DROP INTO EACH EYES NIGHTLY INDICATIONS: WIDE-ANGLE GLAUCOMA., Disp: 15 mL, Rfl: 1 .  budesonide-glycopyr-formoterol (Breztri Aerosphere) 160-9-4.8 mcg/actuation inhaler, Inhale 2 puffs 2 (two) times a day.  (Patient not taking: Reported on 06/29/2023), Disp: 10.7 g, Rfl: 0 .  cholecalciferol (VITAMIN D3) 1,000 unit (25 mcg) tablet, Take 1,000 Units by mouth daily. (Patient not taking: Reported on 06/29/2023), Disp: , Rfl:  .  fish,bora,flax oils-om3,6,9no1 (Omega 3-6-9) 1,200 mg cap, Take 1 capsule by mouth daily., Disp: , Rfl:  .  GARLIC OIL ORAL, Take 5,000 mg by mouth daily. (Patient not taking: Reported on 06/29/2023), Disp: , Rfl:  .  magnesium oxide 400 mg (241 mg magnesium) tab, Take 2 tablets (800 mg total) by mouth 2 (two) times a day. (Patient not taking: Reported on 06/29/2023), Disp: 360 tablet, Rfl: 3 .  miscellaneous medical supply misc, 1 Adult blood pressure cuff. Dx. I.10  1 Scale. Dx. I50.9 (Patient not taking: Reported on 06/29/2023), Disp: 1 each, Rfl: 0 .  torsemide (DEMADEX) 10 mg tablet, TAKE 1 TABLET (10 MG TOTAL) BY MOUTH AS NEEDED (WEIGHT GAIN OF 3 LBS OVERNIGHT OR 5 LBS IN A WEEK, SWELLING OR SHORTNESS OF BREATH). (Patient not taking: Reported on 06/29/2023), Disp: 90 tablet, Rfl: 3 [6] Allergies Allergen Reactions  . Colchicine Nausea And Vomiting

## 2023-06-29 NOTE — Progress Notes (Signed)
 Patient presented to radiology for right thoracentesis today, patient underwent LEFT thoracentesis yesterday.  Right pleural space visualized with ultrasound which showed loculated pleural effusion with no large pocket of fluid. Patient is currently not having shortness of breath, O2 sat 100% in RA.  After thorough discussion and shared decision making, decision was made to NOT perform right thoracentesis.  Ultrasound images saved for documentation purpose.   Thoracentesis was NOT performed.   Toya Cousin, PA-C 06/29/2023 8:46 AM

## 2023-06-30 NOTE — Telephone Encounter (Signed)
 LM on VM please return this call to [phone].  I am returning the call at the request of Bibi with the access center, patient called to schedule SLP. Please schedule Out patient SLP Evaluation with Jenna Sturges if patient returns the call.   MBS was completed June 2025.

## 2023-07-02 NOTE — Progress Notes (Signed)
 Hematology/Oncology progress note Patient Name:  Stephen Hendricks Date of Birth:  1940/09/04 Date of Encounter:  06/26/2023  Referring Provider:  No ref. 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   Problem list: Problem List[1]  Past Oncologic Therapy Oncology History  Lymphoma    (CMD)  06/29/2023 Initial Diagnosis   Lymphoma    (CMD)   07/05/2023 -  Oncology Treatment   Protocol RiTUXimab / Bendamustine Every 28 Days - [facility]  Chemotherapy/Immunotherapy Medications bendamustine 128 mg in sodium chloride  0.9 % 55.1 mL chemo IVPB, 70 mg/m2 = 128 mg (original dose ), intravenous Dose modification: 70 mg/m2 (Cycle 1) riTUXimab-pvvr (RUXIENCE) 686 mg in sodium chloride  0.9 % 686 mL IVPB, 375 mg/m2 = 686.25 mg, intravenous riTUXimab or biosimilar infusion, 375 mg/m2 = 686.25 mg, intravenous  [Plan is still active]     Diagnostic workup: History   AMENDED REPORT   06/01/23   This report is amended to add additional immunostains performed and the diagnosis has been changed from 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 This diagnosis was communicated to Dr. McQuaid by Dr. Kip, on May 29th, 2025 at 2:55pm.    Additional stains for HHV8, EBV, CD19, CD20, PAX5, CD79b, CD138, and CD3 are ordered.  The neoplastic cells are positive for HHV8, EBV, CD3, CD138 (subset), and CD79b (subset).  See diagnosis comment for additional details.  Final Diagnosis  FLUID LABELED RIGHT PLEURAL CAVITY, CYTOLOGY:        Primary effusion lymphoma.  See comment.       Comments:  Corrected result: Previously reported on 05/03/2023 at 1538 EDT.    Amendment electronically signed by Elsie Belvie Kip, MD on 06/01/2023 at 1506 EDT Electronically signed by Jenkins Erika Glasser, MD on  05/03/2023 at 1538 EDT  Comment VC  The pleural fluid contains a proliferation of highly pleomorphic, atypical mature lymphocytes.  These lymphocytes are positive for leukocyte common antigen (LCA), CD3, MUM1, CD138 (subset), HHV8, EBV in situ, CD79a (subset) and are negative for pankeratin, calretinin, CK7, CK20, TTF-1, CD20, CD19, PAX5, and synaptophysin.    Flow cytometry was performed which demonstrated an atypical population of CD38 bright cells, which were negative for CD19, CD20, surface and cytoplasmic light chains.   These morphologic and immunophenotypic findings are diagnostic of primary effusion lymphoma.     Dr. Gweneth has reviewed the case and agrees with the diagnosis of PEL.   2.  05/25/2023: Final Diagnosis  BONE MARROW:  Normocellular bone marrow (30%) with trilineage hematopoiesis, mildly decreased myelopoiesis, and mildly increased plasma cells. See comment    Final Diagnosis      BONE MARROW; FLOW CYTOMETRIC ANALYSIS:   No monotypic B-cell or plasma cell population identified.  See comment.     COMMENT: Flow cytometry analysis did not identify a monotypic or immunophenotypically aberrant plasma cell population in the current sample.    Results   Fluorescence in situ hybridization analysis result: Negative       Probe / Locus Abnormality  Assessed Cut-off (> or =) % Result (%)   Interpretation    BCL6 (3q27)   BCL6 separation   3.1   1.0   Negative    IGH/MYC/CEP8   IGH/MYC fusion   1.5   0   Negative    MYC (8q24)   MYC separation   6.8  0.5   Negative    BCL2 (18q21-q22)   BCL2 separation   2.3   0.5   Negative      3.  PET scan 05/16/2023: CHEST:   Hypermetabolic mediastinal and right hilar lymph nodes. Index low right paratracheal lymph node measures 7 mm (4/75), SUV max 3.8. Hypermetabolic consolidation in the central right lower lobe SUV max 8.2. Focal hypermetabolism in the central right upper lobe does not have a definite  CT correlate.   Incidental CT findings:   Atherosclerotic calcification of the aorta, aortic valve and coronary arteries. Heart is enlarged. No pericardial effusion. Moderate to large loculated right pleural effusion with pleural thickening, overall decreased in size from 02/07/2023. Collapse/consolidation in the adjacent right middle and right lower lobes. Small left pleural effusion. Centrilobular and paraseptal emphysema. IMPRESSION: 1. Hypermetabolic right lower lobe consolidation with small hypermetabolic right paratracheal and right hilar lymph nodes, findings which may be in keeping with the provided history lymphoma. The possibility of primary bronchogenic carcinoma is another consideration. 2. Moderate to large loculated right pleural effusion with pleural thickening, overall decreased in size from 02/07/2023. Associated compressive atelectasis in the right middle and right lower lobes. 3. Small left pleural effusion. 4. Cholelithiasis. 5. Aortic atherosclerosis (ICD10-I70.0). Coronary artery calcification. 6.  Emphysema (ICD10-J43.9).  Current Therapy:  Interim Note: Stephen Hendricks returns today for follow up of possible lymphoma.  Patient unfortunately continues to lose weight and is very weak tired and fatigued he is not eating much.  No nausea or vomiting reported.  No night sweats no fevers.  Allergies: Colchicine  Medications: Current Medications[2]  Scheduled Meds:    Past Medical History: Medical History[3]  Past Surgical History: Surgical History[4]   Personal and Social History: Social History[5]  Family History: Cancer-related family history includes Breast cancer in his daughter. He indicated that his mother is deceased. He indicated that his father is deceased. He indicated that the status of his daughter is unknown.   Results for orders placed or performed in visit on 06/26/23  Comprehensive Metabolic Panel   Collection  Time: 06/26/23 10:18 AM  Result Value Ref Range   Sodium 134 (L) 136 - 145 mmol/L   Potassium 3.4 3.4 - 4.5 mmol/L   Chloride 97 (L) 98 - 107 mmol/L   CO2 30 21 - 31 mmol/L   Anion Gap 7 6 - 14 mmol/L   Glucose, Random 118 (H) 70 - 99 mg/dL   Blood Urea Nitrogen (BUN) 16 7 - 25 mg/dL   Creatinine 8.92 9.29 - 1.30 mg/dL   eGFR 70 >40 fO/fpw/8.26f7   Albumin 2.7 (L) 3.5 - 5.7 g/dL   Total Protein 7.5 6.4 - 8.9 g/dL   Bilirubin, Total 1.8 (H) 0.3 - 1.0 mg/dL   Alkaline Phosphatase (ALP) 71 34 - 104 U/L   Aspartate Aminotransferase (AST) 12 (L) 13 - 39 U/L   Alanine Aminotransferase (ALT) 5 (L) 7 - 52 U/L   Calcium  8.8 8.6 - 10.3 mg/dL   BUN/Creatinine Ratio     Corrected Calcium  9.8 mg/dL  CBC with Differential   Collection Time: 06/26/23 10:18 AM  Result Value Ref Range   WBC 3.95 (L) 4.40 - 11.00 10*3/uL   RBC 4.20 (L) 4.50 - 5.90 10*6/uL   Hemoglobin 12.1 (L) 14.0 - 17.5 g/dL   Hematocrit 64.5 (L) 58.4 - 50.4 %   Mean Corpuscular Volume (MCV) 84.3 80.0 - 96.0 fL   Mean Corpuscular Hemoglobin (MCH) 28.8 27.5 - 33.2 pg  Mean Corpuscular Hemoglobin Conc (MCHC) 34.2 33.0 - 37.0 g/dL   Red Cell Distribution Width (RDW) 18.6 (H) 12.3 - 17.0 %   Platelet Count (PLT) 166 150 - 450 10*3/uL   Mean Platelet Volume (MPV) 8.1 6.8 - 10.2 fL   Neutrophils % 45 %   Lymphocytes % 30 %   Monocytes % 18 %   Eosinophils % 4 %   Basophils % 3 %   Neutrophils Absolute 1.80 1.80 - 7.80 10*3/uL   Lymphocytes # 1.20 1.00 - 4.80 10*3/uL   Monocytes # 0.70 0.00 - 0.80 10*3/uL   Eosinophils # 0.20 0.00 - 0.50 10*3/uL   Basophils # 0.10 0.00 - 0.20 10*3/uL    Physical Examination: Vital Signs: BP (!) 77/51   Pulse 82   Temp 97.5 F (36.4 C)   Resp 14   Ht 1.88 m (6' 2)   Wt 63.9 kg (140 lb 14.4 oz)   SpO2 95%   BMI 18.09 kg/m  General: Chronically ill-appearing male, ECOG 2 HEENT: Normocephalic, atraumatic. PERRLA, EOMI, sclera anicteric.  Nose without discharge.  Mouth and lips showed  no lesions; oral mucosa is moist.  Neck supple without masses. Lymphatic/Immunologic:  No cervical, axillary, or femoral adenopathy.  Cardiovascular:  RRR  Respiratory: Distant breath sounds bilaterally gastrointestinal:  Abdomen soft and without tenderness; no hepatosplenomegaly or other masses. No clinical ascites. Musculoskeletal:  No bony pain or tenderness. No tenosynovitis or joint effusions noted. Extremities:  No edema or suspicious rashes.  No cyanosis or clubbing. Skin:  No pathologic appearing petechiae or bruising noted. Neurologic: Alert and oriented to person, place, time and circumstance.  Strength and sensation are grossly intact.  Cranial nerves III through XII grossly intact.  Psychiatric:    Mood and affect are normal. Speech is fluent.  Assessment and plan:82 y.o. male with   1.  Possible lymphoma: Patient is undergoing workup.  PET scan does report findings of possible underlying malignancy such as lung cancer.  We will discuss his case at tumor board.  He is short of breath he will have a thoracentesis we will send cytology.  2.  Possible treatment: Patient's condition is continue to deteriorate unclear what to treat the patient with he has multiple comorbidities as including cardiac disease.  We will need to be very careful in terms of systemic therapy.  3.  Follow-up: 1 week or sooner if need arises.  We will see what the outcomes are  A total of 45 minutes was spent in patient care today.  Greater than 50% of my time was spent face-to-face and nonface-to-face with the patient, reviewing his medical records, completing a physical examination, going over the results of the radiology reports, laboratory results, treatment of possible lymphoma, completing treatment plan and answering the patient and family's questions. Patient is encouraged to call with any questions or concerns if they should arise prior to next scheduled office visit.   This record has been created using  Conservation officer, historic buildings.         [1] Patient Active Problem List Diagnosis  . Nasal mucosa dry  . Chronic systolic heart failure (HCC)  . BIV ICD  - MEDTRONIC - VT/VF Tx OFF --------  . Ischemic dilated cardiomyopathy (HCC)  . Essential hypertension  . Pure hypercholesterolemia  . Nonsustained ventricular tachycardia (HCC)  . LV (left ventricular) mural thrombus  . Hypomagnesemia  . Acute pulmonary embolism (HCC)  . Cataracts, bilateral  . PVC (premature ventricular contraction)  .  HFrEF (heart failure with reduced ejection fraction) (HCC)  . Coronary artery disease involving native coronary artery of native heart without angina pectoris  . History of pulmonary embolism  . Decreased range of motion of right shoulder  . Stage 3 chronic kidney disease (HCC)  . Vitamin D deficiency  . Panlobular emphysema    (CMD)  . Cerebrovascular disease with cerebral atrophy  . Shortness of breath  . Lymphoma    (CMD)  [2] Current Outpatient Medications  Medication Sig Dispense Refill  . apixaban  (Eliquis ) 5 mg tab Take 1 tablet (5 mg total) by mouth 2 (two) times a day. 180 tablet 3  . cholecalciferol (VITAMIN D3) 1,000 unit (25 mcg) tablet Take 1,000 Units by mouth daily.    . finasteride  (PROSCAR ) 5 mg tablet Take 1 tablet (5 mg total) by mouth daily. 90 tablet 3  . fish,bora,flax oils-om3,6,9no1 (Omega 3-6-9) 1,200 mg cap Take 1 capsule by mouth daily.    SABRA GARLIC OIL ORAL Take 5,000 mg by mouth daily.    . magnesium oxide 400 mg (241 mg magnesium) tab Take 2 tablets (800 mg total) by mouth 2 (two) times a day. 360 tablet 3  . methIMAzole (TAPAZOLE) 10 mg tablet Take 1.5 tablets (15 mg total) by mouth daily. 135 tablet 1  . methIMAzole (TAPAZOLE) 5 mg tablet Take one tablet by mouth daily along with (one) 10 mg tablet for a total dose of 15 mg.    . mirtazapine (Remeron) 15 mg tablet Take 1 tablet (15 mg total) by mouth at bedtime. 90 tablet 3  . miscellaneous medical supply  misc 1 Adult blood pressure cuff. Dx. I.10  1 Scale. Dx. I50.9 1 each 0  . omeprazole (PriLOSEC) 20 mg DR capsule TAKE 1 CAPSULE BY MOUTH EVERY DAY 90 capsule 1  . rosuvastatin (CRESTOR) 10 mg tablet Take 1 tablet (10 mg total) by mouth daily. 90 tablet 1  . tamsulosin  (FLOMAX ) 0.4 mg cap TAKE 1 CAPSULE BY MOUTH EVERY DAY 90 capsule 1  . torsemide (DEMADEX) 10 mg tablet TAKE 1 TABLET (10 MG TOTAL) BY MOUTH AS NEEDED (WEIGHT GAIN OF 3 LBS OVERNIGHT OR 5 LBS IN A WEEK, SWELLING OR SHORTNESS OF BREATH). 90 tablet 3  . travoprost (TRAVATAN Z) 0.004 % drop ADMINISTER 1 DROP INTO EACH EYES NIGHTLY INDICATIONS: WIDE-ANGLE GLAUCOMA. 15 mL 1  . [START ON 07/04/2023] acyclovir (ZOVIRAX) 400 mg tablet Take 1 tablet (400 mg total) by mouth 2 (two) times a day. 60 tablet 11  . budesonide-glycopyr-formoterol (Breztri Aerosphere) 160-9-4.8 mcg/actuation inhaler Inhale 2 puffs 2 (two) times a day. 10.7 g 0  . midodrine (PROAMATINE) 5 mg tablet Take 2 tablets (10 mg total) by mouth daily. 90 tablet 2  . [START ON 07/04/2023] ondansetron  (Zofran ) 8 mg tablet Take 1 tablet (8 mg total) by mouth every 8 (eight) hours as needed for nausea or vomiting. 30 tablet 3  . prochlorperazine (COMPAZINE) 10 mg tablet Take 1 tablet (10 mg total) by mouth every 6 (six) hours as needed (chemo induced nausea/vomiting) 30 tablet 3  . [START ON 07/04/2023] sulfamethoxazole-trimethoprim (BACTRIM) 400-80 mg per tablet Take 1 tablet by mouth daily. 30 tablet 1   No current facility-administered medications for this visit.  [3] Past Medical History: Diagnosis Date  . CHF (congestive heart failure)    (CMD)   . Glaucoma   . Heart disease   . Hypertension   . Prostate enlargement   [4] Past Surgical History: Procedure Laterality Date  .  CARDIAC DEFIBRILLATOR PLACEMENT     Procedure: CARDIAC DEFIBRILLATOR PLACEMENT  . FEMUR FRACTURE SURGERY Right    Procedure: FEMUR FRACTURE SURGERY  . JOINT REPLACEMENT     Procedure: JOINT  REPLACEMENT  . TOTAL KNEE ARTHROPLASTY Left    Procedure: REPLACEMENT TOTAL KNEE  [5] Social History Socioeconomic History  . Marital status: Married  Tobacco Use  . Smoking status: Former    Current packs/day: 0.00    Types: Cigarettes    Quit date: 01/03/1990    Years since quitting: 33.5  . Smokeless tobacco: Never  Substance and Sexual Activity  . Alcohol use: Yes    Alcohol/week: 1.0 standard drink of alcohol  . Drug use: Never   Social Drivers of Health   Food Insecurity: Low Risk  (06/09/2023)   Food vital sign   . Within the past 12 months, you worried that your food would run out before you got money to buy more: Never true   . Within the past 12 months, the food you bought just didn't last and you didn't have money to get more: Never true  Transportation Needs: No Transportation Needs (06/09/2023)   Transportation   . In the past 12 months, has lack of reliable transportation kept you from medical appointments, meetings, work or from getting things needed for daily living? : No  Safety: Low Risk  (06/30/2023)   Safety   . How often does anyone, including family and friends, physically hurt you?: Never   . How often does anyone, including family and friends, insult or talk down to you?: Never   . How often does anyone, including family and friends, threaten you with harm?: Never   . How often does anyone, including family and friends, scream or curse at you?: Never  Living Situation: Low Risk  (06/09/2023)   Living Situation   . What is your living situation today?: I have a steady place to live   . Think about the place you live. Do you have problems with any of the following? Choose all that apply:: None/None on this list

## 2023-07-03 NOTE — Progress Notes (Signed)
 Stephen Hendricks FMW:[id number] DOB:10-02-1940  CONSENT FOR THE ADMINISTRATION OF ANTINEOPLASTIC HAZARDOUS OR BIOTHERAPY FOR ONCOLOGY INDICATIONS  I, Stephen Hendricks, have been told by my doctor that the following cancer treatment will be given for the diagnosis of lymphoma. Dr. Fernand has explained to me that the drug(s) used may have good and bad effects. I have been given a copy of Understanding Cancer Treatment with printed information for the following drugs that includes drug-drug and drug-food interactions, a plan for missed doses and instructions on symptoms and adverse events that require me to contact the healthcare setting or seek immediate attention. I can contact a healthcare provider 24 hours a day by calling [phone].   Recommended Treatment Plan--It has been recommended you receive the following antineoplastic hazardous or biotherapy for treatment for your illness Antineoplastic HD/Biotherapy  Frequency # Expected Cycles Antineoplastic HD/Biotherapy  Frequency # Expected Cycles         Rituximab or bio-similar Day 1 every 28 days (6) or as per physician     Bendamustine (bendeka) Day 1 and Day 2 every 28 days 6                          1. The Physician has offered me or my legal representative the opportunity to ask questions and to have those questions answered before obtaining this Consent for Administration for antineoplastic HD and/or biotherapy. I understand the nature of the treatment. I have been instructed/informed of the following:  The risks, complications, and expected benefits or effects of the treatment or procedure including planned supportive care medications.  Any alternatives to the treatment plan or procedure, including no treatment, and risks/benefits of alternatives. The right to refuse treatment or procedure will in no way jeopardize my access to healthcare services.  Procedures for handling medications in the home and  for handling body secretions and waste.  2. I understand that my doctors cannot be sure the treatment will help me.  3. I understand the antineoplastic HD and/or biotherapy medications and supportive medications recommended by my doctor can have short and long-term effects. My doctor talked to me about the most important side effects that may include: Low Blood Counts  Heart Effects Hair Loss Reproductive/Fertility Effects  Risk of Infection/Bleeding Lung Effects Allergic Type Reactions Sexual Effects  Fatigue Kidney/Bladder Effects Hearing Loss  Secondary Cancers  Sores in Mouth or Throat Pancreas/Liver/Intestine Effects Thyroid/Adrenal Effects Skin Effects  Nausea/Vomiting Constipation/Diarrhea  Muscle/Bone/Nerve Effects    4. I understand that side effects of antineoplastic HD and/or biotherapy may be serious at times resulting in life-threatening side effects, hospitalization, or death. There may be side effects from my chemotherapy and/or biotherapy that are not listed on this form. Each patient can respond differently and could have side effects that have not been reported by others. 5. For women of childbearing potential: I understand abstinence or 2 forms of reliable contraception must be used during therapy and 1 month following discontinuation of treatment. Male patients should use a condom with women of child bearing potential during therapy and 1 month following discontinuation of treatment.  6. I understand that if I am experiencing symptoms or adverse events related to oral or self-administered treatment or if I need to cancel or reschedule an appointment that I should contact my healthcare provider for advice on continuing or discontinuing my treatment or to make a new appointment. 7. I understand that by  signing this document I agree to undergo antineoplastic HD and/or biotherapy, which may include FDA-approved biosimilar or generic products. 8. I understand the Goal of Treatment at this  time is to control the progression of my cancer or relieve symptoms and improve quality of life.   ____________________________________________________________________ Patient or Legal Representative Signature  Date/Time   ____________________________________________________________________ Witness Signature      Date/Time   ____________________________________________________________________ Verified By Signature     Date/Time   ____________________________________________________________________ Interpreter Signature     Date/Time  Refer to Media Tab in [place] to view a signed copy of this document.

## 2023-07-03 NOTE — Progress Notes (Signed)
 Name: Stephen Hendricks MRN: [id number] Age/Date of Birth: 1940-01-16  Encounter Date: 07/03/2023  Oncology Patient Education:   Education was provided to: Patient and Spouse  Type of Education: Optician, dispensing, Understanding Cancer Treatment: A guide for you and your family , Drug Information Sheets, Copy of Consents, and Verbal Instruction  Readiness: Acceptance   Topics Reviewed: Chemotherapy Consent, Diarrhea , Fatigue, Fever, Injections, Nausea/Vomiting , Neurotoxicity , Neutropenia, Neutropenic Precautions , Symptom Management, and Treatment Process  Printed information provided from: IV Cancer Ed (NCODA IVE) Sheets  Additional Comments:  Response: Verbalizes Understanding   Witness Signature on Informed Consent: Yes  Total Minutes Spent on Patient Education: 47  Education Provided by: Arland Cleverly RN,OCN

## 2023-07-03 NOTE — Progress Notes (Signed)
 Information given regarding growth factor administration and the benefit of taking Claritin with instructions

## 2023-07-04 NOTE — Progress Notes (Signed)
   07/04/23 1025  Post - Proc Vital Signs  BP (!) 81/54  BP Location Left arm  BP Method Automatic  MAP (mmHg) 60  Heart Rate 76  Cardiac Rhythm Atrial paced  Resp 14  SpO2 97 %  Oxygen Therapy  O2 Device None (Room air)  Pulse Oximetry Type Continuous  Aldrete  Activity 2  Respiration 2  Circulation 2  Consciousness 2  Color 2  Aldrete Score 10  AVPU  Alertness Scale (AVPU) A  Pain Assessment  Pain Assessment No/denies pain  Pain Score   0  Baseline Nursing Assessment  Patient Position Supine  Skin Color Appropriate for ethnicity  Airway Natural  Respiratory Effort Spontaneous;Regular  Skin Condition/Temp Warm;Dry  L Hand Grasp Moderate  R Hand Grasp Moderate  Safety Wheels locked;Siderails Up  Care Handoff  Report Received From Randine, RN  Report via by phone  Report Given To Cassandra, RN  Report given to nurse

## 2023-07-04 NOTE — H&P (Signed)
 ------------------------------------------------------------------------------- Attestation signed by Juliene Bernardino Balder, MD at 07/04/2023  9:48 AM Agree with note.  Risks and benefits of a tunneled catheter with port placement were discussed with the patient including, but not limited to bleeding, infection, pneumothorax, or fibrin sheath development and need for additional procedures.  The patient has been informed how conscious sedation is performed and understands that all sedation medications involve risks of complications and serious possible damage to vital organs such as the brain, heart, lung, liver, and kidney, and that in some cases use of these medications may result in cardiac arrest, and/or brain death from both known and unknown causes. The patient has been informed that, during the course of the conscious sedation, unforeseen conditions may necessitate additional or different procedures than set forth above.  All of the patient's questions were answered, patient is agreeable to proceed. Consent signed and in chart.  -------------------------------------------------------------------------------      Chief Complaint: Lymphoma  Referring Provider(s): Fernand  Supervising Physician: Balder  Patient Status: HP- Outpt  History of Present Illness: Stephen Hendricks is a 83 y.o. male with past medical history significant for CHF, HTN and lymphoma who presents today for port placement. Stephen Hendricks is known to our service from multiple thoracenteses and bone marrow biopsy (Dr. Balder) 05/25/23. He is planned to undergo chemotherapy and IR has been consulted for port placement.   Patient is FULL Code  Medical History[1]  Surgical History[2]  Family History[3]  Social History[4]  Allergies: Colchicine  Medications: Prior to Admission medications  Medication Sig Start Date End Date Taking? Authorizing Provider  apixaban  (Eliquis ) 5 mg tab Take 1 tablet (5 mg  total) by mouth 2 (two) times a day. 09/13/22  Yes Kristen Diane Kaplan, PA-C  acyclovir (ZOVIRAX) 400 mg tablet Take 1 tablet (400 mg total) by mouth 2 (two) times a day. 07/04/23   Sharma MARLA Fernand, MD  budesonide-glycopyr-formoterol (Breztri Aerosphere) 160-9-4.8 mcg/actuation inhaler Inhale 2 puffs 2 (two) times a day. 06/28/23   Vicenta Thresa Lennert, MD  cholecalciferol (VITAMIN D3) 1,000 unit (25 mcg) tablet Take 1,000 Units by mouth daily. 11/02/21   HISTORICAL PROVIDER, CONVERSION  finasteride  (PROSCAR ) 5 mg tablet Take 1 tablet (5 mg total) by mouth daily. 09/13/22   Kristen Diane Kaplan, PA-C  fish,bora,flax oils-om3,6,9no1 (Omega 3-6-9) 1,200 mg cap Take 1 capsule by mouth daily. 01/28/20   HISTORICAL PROVIDER, CONVERSION  GARLIC OIL ORAL Take 5,000 mg by mouth daily.    HISTORICAL PROVIDER, CONVERSION  magnesium oxide 400 mg (241 mg magnesium) tab Take 2 tablets (800 mg total) by mouth 2 (two) times a day. 04/26/23   Olivia Natalie Gilbert, MD  methIMAzole (TAPAZOLE) 10 mg tablet Take 1.5 tablets (15 mg total) by mouth daily. 04/28/23   Ilona Davidovna Goukassian, DO  methIMAzole (TAPAZOLE) 5 mg tablet Take one tablet by mouth daily along with (one) 10 mg tablet for a total dose of 15 mg.    HISTORICAL PROVIDER, CONVERSION  midodrine (PROAMATINE) 5 mg tablet Take 2 tablets (10 mg total) by mouth daily. 06/29/23   Kristen Diane Kaplan, PA-C  mirtazapine (Remeron) 15 mg tablet Take 1 tablet (15 mg total) by mouth at bedtime. 05/09/23 05/08/24  Sharma MARLA Fernand, MD  miscellaneous medical supply misc 1 Adult blood pressure cuff. Dx. I.10  1 Scale. Dx. I50.9 04/06/23   Shawn P Lazoff, DO  omeprazole (PriLOSEC) 20 mg DR capsule TAKE 1 CAPSULE BY MOUTH EVERY DAY 03/28/23   Kristen Diane Kaplan, PA-C  ondansetron  (Zofran ) 8 mg tablet Take 1 tablet (8 mg total) by mouth every 8 (eight) hours as needed for nausea or vomiting. 07/04/23   Kalsoom K Khan, MD  prochlorperazine (COMPAZINE) 10 mg tablet Take 1 tablet (10 mg  total) by mouth every 6 (six) hours as needed (chemo induced nausea/vomiting) 06/30/23   Sharma MARLA Bathe, MD  rosuvastatin (CRESTOR) 10 mg tablet Take 1 tablet (10 mg total) by mouth daily. 12/09/22   Kristen Diane Kaplan, PA-C  sulfamethoxazole-trimethoprim (BACTRIM) 400-80 mg per tablet Take 1 tablet by mouth daily. 07/04/23 09/02/23  Sharma MARLA Bathe, MD  tamsulosin  (FLOMAX ) 0.4 mg cap TAKE 1 CAPSULE BY MOUTH EVERY DAY 03/28/23   Kristen Diane Kaplan, PA-C  torsemide (DEMADEX) 10 mg tablet TAKE 1 TABLET (10 MG TOTAL) BY MOUTH AS NEEDED (WEIGHT GAIN OF 3 LBS OVERNIGHT OR 5 LBS IN A WEEK, SWELLING OR SHORTNESS OF BREATH). 03/06/23   Reena Merton Costain, DNP, APRN  travoprost (TRAVATAN Z) 0.004 % drop ADMINISTER 1 DROP INTO EACH EYES NIGHTLY INDICATIONS: WIDE-ANGLE GLAUCOMA. 05/16/23   Josette Loa Aho, PA-C    Review of Systems: A 12 point ROS discussed and pertinent positives are indicated in the HPI above.  All other systems are negative.  Advanced Care Plan: The advanced care plan/surrogate decision maker was discussed at the time of visit and the patient did not wish to discuss or was not able to name a surrogate decision  maker or provide an advanced care plan.  Vital Signs:  Vitals:   07/04/23 0759  BP: (!) 94/59  Pulse: 66  Resp: 16  Temp: 97.8 F (36.6 C)  SpO2: 97%    Physical Exam Vitals reviewed.  Constitutional:      Appearance: Normal appearance.  HENT:     Head: Normocephalic and atraumatic.   Cardiovascular:     Rate and Rhythm: Normal rate and regular rhythm.  Pulmonary:     Effort: Pulmonary effort is normal. No respiratory distress.     Breath sounds: Normal breath sounds.  Abdominal:     Palpations: Abdomen is soft.   Musculoskeletal:     Cervical back: Normal range of motion.   Skin:    General: Skin is warm and dry.   Neurological:     Mental Status: He is alert and oriented to person, place, and time.   Psychiatric:        Mood and Affect: Mood normal.         Behavior: Behavior normal.        Thought Content: Thought content normal.        Judgment: Judgment normal.     Mallampati Class:   I (soft palate, uvula, fauces, and tonsillar pillars visible)  ASA Grade: ASA 3 - Patient with moderate systemic disease with functional limitations   Assessment and Plan:  83 y/o M with recently diagnosed lymphoma who presents today for port placement prior to beginning chemotherapy.  Risks and benefits of a tunneled catheter with port placement were discussed with the patient including, but not limited to bleeding, infection, pneumothorax, or fibrin sheath development and need for additional procedures.  The patient has been informed how conscious sedation is performed and understands that all sedation medications involve risks of complications and serious possible damage to vital organs such as the brain, heart, lung, liver, and kidney, and that in some cases use of these medications may result in cardiac arrest, and/or brain death from both known and unknown causes. The patient  has been informed that, during the course of the conscious sedation, unforeseen conditions may necessitate additional or different procedures than set forth above.  All of the patient's questions were answered, patient is agreeable to proceed.  Consent signed and in chart.  Signed: Clotilda Rosina Hesselbach, PA-C     I spent a total of 30 minutes in face to face in clinical consultation, greater than 50% of which was counseling/coordinating care for lymphoma.       [1] Past Medical History: Diagnosis Date  . CHF (congestive heart failure)    (CMD)   . Glaucoma   . Heart disease   . Hypertension   . Prostate enlargement   [2] Past Surgical History: Procedure Laterality Date  . CARDIAC DEFIBRILLATOR PLACEMENT     Procedure: CARDIAC DEFIBRILLATOR PLACEMENT  . FEMUR FRACTURE SURGERY Right    Procedure: FEMUR FRACTURE SURGERY  . JOINT REPLACEMENT      Procedure: JOINT REPLACEMENT  . TOTAL KNEE ARTHROPLASTY Left    Procedure: REPLACEMENT TOTAL KNEE  [3] Family History Problem Relation Name Age of Onset  . Cardiomyopathy Mother    . Breast cancer Daughter    [4] Social History Socioeconomic History  . Marital status: Married  Tobacco Use  . Smoking status: Former    Current packs/day: 0.00    Types: Cigarettes    Quit date: 01/03/1990    Years since quitting: 33.5  . Smokeless tobacco: Never  Substance and Sexual Activity  . Alcohol use: Yes    Alcohol/week: 1.0 standard drink of alcohol  . Drug use: Never   Social Drivers of Health   Food Insecurity: Low Risk  (06/09/2023)   Food vital sign   . Within the past 12 months, you worried that your food would run out before you got money to buy more: Never true   . Within the past 12 months, the food you bought just didn't last and you didn't have money to get more: Never true  Transportation Needs: No Transportation Needs (06/09/2023)   Transportation   . In the past 12 months, has lack of reliable transportation kept you from medical appointments, meetings, work or from getting things needed for daily living? : No  Safety: Low Risk  (06/30/2023)   Safety   . How often does anyone, including family and friends, physically hurt you?: Never   . How often does anyone, including family and friends, insult or talk down to you?: Never   . How often does anyone, including family and friends, threaten you with harm?: Never   . How often does anyone, including family and friends, scream or curse at you?: Never  Living Situation: Low Risk  (06/09/2023)   Living Situation   . What is your living situation today?: I have a steady place to live   . Think about the place you live. Do you have problems with any of the following? Choose all that apply:: None/None on this list

## 2023-07-04 NOTE — Anesthesia Postprocedure Evaluation (Signed)
   Interventional Radiology Procedure Note     Interventional Radiologist: Philip   Procedure: Port placement   Indication: Lymphoma   Findings: Right jugular port.  Tip at SVC/RA junction   Estimated blood loss (if applicable): Minimal   Specimens removed (if applicable): None   Postoperative diagnosis : Lymphoma  ADAM BERNARDINO PHILIP, MD
